# Patient Record
Sex: Female | Born: 1935
Health system: Southern US, Community
[De-identification: ages and names within clinical notes are randomized; demographics above are authoritative.]

## PROBLEM LIST (undated history)

## (undated) DIAGNOSIS — R001 Bradycardia, unspecified: Secondary | ICD-10-CM

## (undated) DIAGNOSIS — G43909 Migraine, unspecified, not intractable, without status migrainosus: Secondary | ICD-10-CM

## (undated) DIAGNOSIS — Z87440 Personal history of urinary (tract) infections: Secondary | ICD-10-CM

## (undated) DIAGNOSIS — M109 Gout, unspecified: Secondary | ICD-10-CM

## (undated) DIAGNOSIS — I10 Essential (primary) hypertension: Secondary | ICD-10-CM

## (undated) DIAGNOSIS — R42 Dizziness and giddiness: Secondary | ICD-10-CM

## (undated) DIAGNOSIS — E785 Hyperlipidemia, unspecified: Secondary | ICD-10-CM

## (undated) DIAGNOSIS — E079 Disorder of thyroid, unspecified: Secondary | ICD-10-CM

## (undated) HISTORY — DX: Migraine, unspecified, not intractable, without status migrainosus: G43.909

## (undated) HISTORY — DX: Personal history of urinary (tract) infections: Z87.440

## (undated) HISTORY — DX: Gout, unspecified: M10.9

## (undated) HISTORY — PX: PARATHYROIDECTOMY: SHX19

## (undated) HISTORY — DX: Disorder of thyroid, unspecified: E07.9

## (undated) HISTORY — PX: CHOLECYSTECTOMY: SHX55

## (undated) HISTORY — PX: GALLBLADDER SURGERY: SHX652

## (undated) HISTORY — DX: Essential (primary) hypertension: I10

## (undated) HISTORY — PX: ABDOMINAL HYSTERECTOMY: SHX81

## (undated) HISTORY — DX: Hyperlipidemia, unspecified: E78.5

---

## 2003-11-16 ENCOUNTER — Ambulatory Visit (HOSPITAL_COMMUNITY): Admission: RE | Admit: 2003-11-16 | Discharge: 2003-11-16 | Payer: Self-pay | Admitting: Cardiology

## 2004-04-20 ENCOUNTER — Emergency Department (HOSPITAL_COMMUNITY): Admission: EM | Admit: 2004-04-20 | Discharge: 2004-04-20 | Payer: Self-pay | Admitting: Family Medicine

## 2004-12-27 ENCOUNTER — Ambulatory Visit (HOSPITAL_COMMUNITY): Admission: RE | Admit: 2004-12-27 | Discharge: 2004-12-27 | Payer: Self-pay | Admitting: Obstetrics and Gynecology

## 2005-01-03 ENCOUNTER — Emergency Department (HOSPITAL_COMMUNITY): Admission: EM | Admit: 2005-01-03 | Discharge: 2005-01-03 | Payer: Self-pay | Admitting: Emergency Medicine

## 2005-02-27 ENCOUNTER — Emergency Department (HOSPITAL_COMMUNITY): Admission: EM | Admit: 2005-02-27 | Discharge: 2005-02-27 | Payer: Self-pay | Admitting: Family Medicine

## 2005-03-19 ENCOUNTER — Encounter: Admission: RE | Admit: 2005-03-19 | Discharge: 2005-03-19 | Payer: Self-pay | Admitting: Internal Medicine

## 2006-03-10 ENCOUNTER — Ambulatory Visit (HOSPITAL_COMMUNITY): Admission: RE | Admit: 2006-03-10 | Discharge: 2006-03-10 | Payer: Self-pay | Admitting: Obstetrics and Gynecology

## 2006-03-17 ENCOUNTER — Encounter (HOSPITAL_COMMUNITY): Admission: RE | Admit: 2006-03-17 | Discharge: 2006-03-17 | Payer: Self-pay | Admitting: Cardiology

## 2006-05-13 ENCOUNTER — Emergency Department (HOSPITAL_COMMUNITY): Admission: EM | Admit: 2006-05-13 | Discharge: 2006-05-13 | Payer: Self-pay | Admitting: Family Medicine

## 2006-07-29 ENCOUNTER — Encounter: Admission: RE | Admit: 2006-07-29 | Discharge: 2006-07-29 | Payer: Self-pay | Admitting: Sports Medicine

## 2006-09-10 ENCOUNTER — Inpatient Hospital Stay (HOSPITAL_BASED_OUTPATIENT_CLINIC_OR_DEPARTMENT_OTHER): Admission: RE | Admit: 2006-09-10 | Discharge: 2006-09-10 | Payer: Self-pay | Admitting: Cardiology

## 2007-01-03 ENCOUNTER — Emergency Department (HOSPITAL_COMMUNITY): Admission: EM | Admit: 2007-01-03 | Discharge: 2007-01-03 | Payer: Self-pay | Admitting: Emergency Medicine

## 2007-01-28 ENCOUNTER — Encounter: Admission: RE | Admit: 2007-01-28 | Discharge: 2007-01-28 | Payer: Self-pay | Admitting: Family Medicine

## 2007-06-09 ENCOUNTER — Encounter: Admission: RE | Admit: 2007-06-09 | Discharge: 2007-06-09 | Payer: Self-pay | Admitting: Internal Medicine

## 2007-06-23 ENCOUNTER — Ambulatory Visit (HOSPITAL_COMMUNITY): Admission: RE | Admit: 2007-06-23 | Discharge: 2007-06-23 | Payer: Self-pay | Admitting: Obstetrics and Gynecology

## 2007-10-01 ENCOUNTER — Emergency Department (HOSPITAL_COMMUNITY): Admission: EM | Admit: 2007-10-01 | Discharge: 2007-10-01 | Payer: Self-pay | Admitting: Emergency Medicine

## 2008-02-18 ENCOUNTER — Encounter: Admission: RE | Admit: 2008-02-18 | Discharge: 2008-02-18 | Payer: Self-pay | Admitting: Internal Medicine

## 2008-05-06 ENCOUNTER — Encounter: Admission: RE | Admit: 2008-05-06 | Discharge: 2008-05-06 | Payer: Self-pay | Admitting: Family Medicine

## 2008-05-20 ENCOUNTER — Observation Stay (HOSPITAL_COMMUNITY): Admission: EM | Admit: 2008-05-20 | Discharge: 2008-05-22 | Payer: Self-pay | Admitting: Emergency Medicine

## 2008-07-22 ENCOUNTER — Ambulatory Visit (HOSPITAL_COMMUNITY): Admission: RE | Admit: 2008-07-22 | Discharge: 2008-07-22 | Payer: Self-pay | Admitting: Cardiology

## 2008-10-22 ENCOUNTER — Emergency Department (HOSPITAL_COMMUNITY): Admission: EM | Admit: 2008-10-22 | Discharge: 2008-10-22 | Payer: Self-pay | Admitting: Emergency Medicine

## 2008-11-21 ENCOUNTER — Encounter: Admission: RE | Admit: 2008-11-21 | Discharge: 2008-11-21 | Payer: Self-pay | Admitting: Obstetrics and Gynecology

## 2008-12-04 ENCOUNTER — Inpatient Hospital Stay (HOSPITAL_COMMUNITY): Admission: EM | Admit: 2008-12-04 | Discharge: 2008-12-07 | Payer: Self-pay | Admitting: Emergency Medicine

## 2008-12-04 ENCOUNTER — Ambulatory Visit: Payer: Self-pay | Admitting: Internal Medicine

## 2008-12-05 ENCOUNTER — Ambulatory Visit: Payer: Self-pay | Admitting: Vascular Surgery

## 2008-12-05 ENCOUNTER — Encounter (INDEPENDENT_AMBULATORY_CARE_PROVIDER_SITE_OTHER): Payer: Self-pay | Admitting: Internal Medicine

## 2008-12-26 ENCOUNTER — Encounter: Payer: Self-pay | Admitting: Internal Medicine

## 2009-01-25 DIAGNOSIS — I10 Essential (primary) hypertension: Secondary | ICD-10-CM | POA: Insufficient documentation

## 2009-01-25 DIAGNOSIS — E7800 Pure hypercholesterolemia, unspecified: Secondary | ICD-10-CM

## 2009-01-26 ENCOUNTER — Emergency Department (HOSPITAL_COMMUNITY): Admission: EM | Admit: 2009-01-26 | Discharge: 2009-01-26 | Payer: Self-pay | Admitting: Emergency Medicine

## 2009-02-02 ENCOUNTER — Ambulatory Visit: Payer: Self-pay | Admitting: Internal Medicine

## 2009-02-02 DIAGNOSIS — R42 Dizziness and giddiness: Secondary | ICD-10-CM

## 2009-11-13 ENCOUNTER — Encounter: Admission: RE | Admit: 2009-11-13 | Discharge: 2009-11-13 | Payer: Self-pay | Admitting: Obstetrics and Gynecology

## 2010-02-02 ENCOUNTER — Emergency Department (HOSPITAL_COMMUNITY)
Admission: EM | Admit: 2010-02-02 | Discharge: 2010-02-02 | Payer: Self-pay | Source: Home / Self Care | Admitting: Emergency Medicine

## 2010-02-04 ENCOUNTER — Encounter: Payer: Self-pay | Admitting: Obstetrics and Gynecology

## 2010-02-13 NOTE — Consult Note (Signed)
Summary: [facility]  [facility]   Imported By: Marylou Mccoy 02/15/2009 16:18:25  _____________________________________________________________________  External Attachment:    Type:   Image     Comment:   External Document
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
## 2010-03-21 ENCOUNTER — Other Ambulatory Visit: Payer: Self-pay | Admitting: Internal Medicine

## 2010-03-21 DIAGNOSIS — M79604 Pain in right leg: Secondary | ICD-10-CM

## 2010-03-21 DIAGNOSIS — M545 Low back pain: Secondary | ICD-10-CM

## 2010-03-23 ENCOUNTER — Emergency Department (HOSPITAL_COMMUNITY)
Admission: EM | Admit: 2010-03-23 | Discharge: 2010-03-23 | Disposition: A | Discharge status: Home / Self Care | Payer: Medicare Other | Attending: Emergency Medicine | Admitting: Emergency Medicine

## 2010-03-23 ENCOUNTER — Inpatient Hospital Stay (INDEPENDENT_AMBULATORY_CARE_PROVIDER_SITE_OTHER)
Admission: RE | Admit: 2010-03-23 | Discharge: 2010-03-23 | Disposition: A | Discharge status: Home / Self Care | Payer: BC Managed Care – PPO | Source: Ambulatory Visit | Attending: Family Medicine | Admitting: Family Medicine

## 2010-03-23 DIAGNOSIS — E785 Hyperlipidemia, unspecified: Secondary | ICD-10-CM | POA: Insufficient documentation

## 2010-03-23 DIAGNOSIS — R82998 Other abnormal findings in urine: Secondary | ICD-10-CM | POA: Insufficient documentation

## 2010-03-23 DIAGNOSIS — R197 Diarrhea, unspecified: Secondary | ICD-10-CM

## 2010-03-23 DIAGNOSIS — E860 Dehydration: Secondary | ICD-10-CM

## 2010-03-23 DIAGNOSIS — R112 Nausea with vomiting, unspecified: Secondary | ICD-10-CM

## 2010-03-23 DIAGNOSIS — R1110 Vomiting, unspecified: Secondary | ICD-10-CM | POA: Insufficient documentation

## 2010-03-23 DIAGNOSIS — K5289 Other specified noninfective gastroenteritis and colitis: Secondary | ICD-10-CM | POA: Insufficient documentation

## 2010-03-23 DIAGNOSIS — Z7982 Long term (current) use of aspirin: Secondary | ICD-10-CM | POA: Insufficient documentation

## 2010-03-23 DIAGNOSIS — R1915 Other abnormal bowel sounds: Secondary | ICD-10-CM | POA: Insufficient documentation

## 2010-03-23 DIAGNOSIS — R109 Unspecified abdominal pain: Secondary | ICD-10-CM | POA: Insufficient documentation

## 2010-03-23 DIAGNOSIS — Z79899 Other long term (current) drug therapy: Secondary | ICD-10-CM | POA: Insufficient documentation

## 2010-03-23 DIAGNOSIS — I10 Essential (primary) hypertension: Secondary | ICD-10-CM | POA: Insufficient documentation

## 2010-03-23 LAB — POCT I-STAT, CHEM 8
BUN: 23 mg/dL (ref 6–23)
Creatinine, Ser: 1.2 mg/dL (ref 0.4–1.2)
Glucose, Bld: 133 mg/dL — ABNORMAL HIGH (ref 70–99)
Hemoglobin: 18.4 g/dL — ABNORMAL HIGH (ref 12.0–15.0)
TCO2: 25 mmol/L (ref 0–100)

## 2010-03-23 LAB — DIFFERENTIAL
Basophils Absolute: 0 10*3/uL (ref 0.0–0.1)
Eosinophils Absolute: 0 10*3/uL (ref 0.0–0.7)
Eosinophils Relative: 1 % (ref 0–5)
Lymphs Abs: 0.5 10*3/uL — ABNORMAL LOW (ref 0.7–4.0)
Neutrophils Relative %: 88 % — ABNORMAL HIGH (ref 43–77)

## 2010-03-23 LAB — CBC
MCV: 84.2 fL (ref 78.0–100.0)
Platelets: 188 10*3/uL (ref 150–400)
RBC: 5.31 MIL/uL — ABNORMAL HIGH (ref 3.87–5.11)
RDW: 14.8 % (ref 11.5–15.5)
WBC: 8.8 10*3/uL (ref 4.0–10.5)

## 2010-03-23 LAB — URINALYSIS, ROUTINE W REFLEX MICROSCOPIC
Glucose, UA: NEGATIVE mg/dL
Hgb urine dipstick: NEGATIVE
Protein, ur: NEGATIVE mg/dL
Specific Gravity, Urine: 1.023 (ref 1.005–1.030)
Urobilinogen, UA: 0.2 mg/dL (ref 0.0–1.0)

## 2010-03-23 LAB — URINE MICROSCOPIC-ADD ON

## 2010-03-23 LAB — COMPREHENSIVE METABOLIC PANEL
ALT: 44 U/L — ABNORMAL HIGH (ref 0–35)
AST: 31 U/L (ref 0–37)
Albumin: 4.1 g/dL (ref 3.5–5.2)
Alkaline Phosphatase: 90 U/L (ref 39–117)
Chloride: 103 mEq/L (ref 96–112)
GFR calc Af Amer: >60 mL/min (ref >60)
Potassium: 3.6 mEq/L (ref 3.5–5.1)
Sodium: 137 mEq/L (ref 135–145)
Total Bilirubin: 0.8 mg/dL (ref 0.3–1.2)
Total Protein: 7.6 g/dL (ref 6.0–8.3)

## 2010-03-25 LAB — URINE CULTURE

## 2010-03-26 ENCOUNTER — Other Ambulatory Visit: Payer: BC Managed Care – PPO

## 2010-04-13 ENCOUNTER — Other Ambulatory Visit: Payer: BC Managed Care – PPO

## 2010-04-16 ENCOUNTER — Ambulatory Visit
Admission: RE | Admit: 2010-04-16 | Discharge: 2010-04-16 | Disposition: A | Discharge status: Home / Self Care | Payer: Medicare Other | Source: Ambulatory Visit | Attending: Internal Medicine | Admitting: Internal Medicine

## 2010-04-16 DIAGNOSIS — M79604 Pain in right leg: Secondary | ICD-10-CM

## 2010-04-16 DIAGNOSIS — M545 Low back pain: Secondary | ICD-10-CM

## 2010-04-16 MED ORDER — GADOBENATE DIMEGLUMINE 529 MG/ML IV SOLN
18.0000 mL | Freq: Once | INTRAVENOUS | Status: AC | PRN
  Administered 2010-04-16: 18 mL via INTRAVENOUS

## 2010-04-18 LAB — CARDIAC PANEL(CRET KIN+CKTOT+MB+TROPI)
CK, MB: 2.4 ng/mL (ref 0.3–4.0)
Relative Index: 1.7 (ref 0.0–2.5)
Relative Index: 2.3 (ref 0.0–2.5)
Total CK: 103 U/L (ref 7–177)
Troponin I: 0.01 ng/mL (ref 0.00–0.06)
Troponin I: 0.02 ng/mL (ref 0.00–0.06)

## 2010-04-18 LAB — COMPREHENSIVE METABOLIC PANEL
ALT: 22 U/L (ref 0–35)
Albumin: 3.9 g/dL (ref 3.5–5.2)
Alkaline Phosphatase: 78 U/L (ref 39–117)
Chloride: 102 mEq/L (ref 96–112)
Glucose, Bld: 144 mg/dL — ABNORMAL HIGH (ref 70–99)
Potassium: 3.6 mEq/L (ref 3.5–5.1)
Sodium: 136 mEq/L (ref 135–145)
Total Bilirubin: 0.9 mg/dL (ref 0.3–1.2)
Total Protein: 7 g/dL (ref 6.0–8.3)

## 2010-04-18 LAB — POCT CARDIAC MARKERS
CKMB, poc: 1.4 ng/mL (ref 1.0–8.0)
CKMB, poc: <1 ng/mL — ABNORMAL LOW (ref 1.0–8.0)
Myoglobin, poc: 80.3 ng/mL (ref 12–200)
Myoglobin, poc: 89.4 ng/mL (ref 12–200)
Troponin i, poc: <0.05 ng/mL (ref 0.00–0.09)

## 2010-04-18 LAB — POCT I-STAT, CHEM 8
BUN: 8 mg/dL (ref 6–23)
Creatinine, Ser: 1.1 mg/dL (ref 0.4–1.2)
Hemoglobin: 15.3 g/dL — ABNORMAL HIGH (ref 12.0–15.0)
Potassium: 3.5 mEq/L (ref 3.5–5.1)
Sodium: 138 mEq/L (ref 135–145)
TCO2: 27 mmol/L (ref 0–100)

## 2010-04-18 LAB — URINALYSIS, ROUTINE W REFLEX MICROSCOPIC
Glucose, UA: NEGATIVE mg/dL
Hgb urine dipstick: NEGATIVE
Ketones, ur: NEGATIVE mg/dL
Protein, ur: NEGATIVE mg/dL
Urobilinogen, UA: 0.2 mg/dL (ref 0.0–1.0)

## 2010-04-18 LAB — URINE CULTURE: Colony Count: 4000

## 2010-04-18 LAB — CBC
HCT: 41.9 % (ref 36.0–46.0)
Hemoglobin: 13.9 g/dL (ref 12.0–15.0)
RDW: 14.7 % (ref 11.5–15.5)
WBC: 4.7 10*3/uL (ref 4.0–10.5)

## 2010-04-18 LAB — RAPID URINE DRUG SCREEN, HOSP PERFORMED
Barbiturates: NOT DETECTED
Opiates: NOT DETECTED

## 2010-04-18 LAB — DIFFERENTIAL
Basophils Absolute: 0 10*3/uL (ref 0.0–0.1)
Basophils Relative: 1 % (ref 0–1)
Eosinophils Absolute: 0.2 10*3/uL (ref 0.0–0.7)
Monocytes Absolute: 0.4 10*3/uL (ref 0.1–1.0)
Monocytes Relative: 9 % (ref 3–12)
Neutrophils Relative %: 43 % (ref 43–77)

## 2010-04-18 LAB — CK TOTAL AND CKMB (NOT AT ARMC)
CK, MB: 2.3 ng/mL (ref 0.3–4.0)
Relative Index: 1.7 (ref 0.0–2.5)
Total CK: 139 U/L (ref 7–177)

## 2010-04-24 LAB — HEMOGLOBIN A1C: Hgb A1c MFr Bld: 6.1 % (ref 4.6–6.1)

## 2010-04-24 LAB — DIFFERENTIAL
Eosinophils Absolute: 0 10*3/uL (ref 0.0–0.7)
Eosinophils Relative: 0 % (ref 0–5)
Lymphocytes Relative: 27 % (ref 12–46)
Monocytes Absolute: 0 10*3/uL — ABNORMAL LOW (ref 0.1–1.0)
Neutrophils Relative %: 72 % (ref 43–77)

## 2010-04-24 LAB — CBC
MCV: 87.9 fL (ref 78.0–100.0)
Platelets: DECREASED 10*3/uL (ref 150–400)
WBC: 3.4 10*3/uL — ABNORMAL LOW (ref 4.0–10.5)

## 2010-04-24 LAB — GLUCOSE, CAPILLARY
Glucose-Capillary: 157 mg/dL — ABNORMAL HIGH (ref 70–99)
Glucose-Capillary: 198 mg/dL — ABNORMAL HIGH (ref 70–99)
Glucose-Capillary: 203 mg/dL — ABNORMAL HIGH (ref 70–99)

## 2010-04-24 LAB — LIPID PANEL
LDL Cholesterol: 84 mg/dL (ref 0–99)
Triglycerides: 52 mg/dL (ref <150)

## 2010-04-24 LAB — APTT: aPTT: 29 seconds (ref 24–37)

## 2010-04-24 LAB — BASIC METABOLIC PANEL
BUN: 8 mg/dL (ref 6–23)
Calcium: 9.5 mg/dL (ref 8.4–10.5)
Chloride: 109 mEq/L (ref 96–112)
Creatinine, Ser: 0.97 mg/dL (ref 0.4–1.2)

## 2010-04-24 LAB — PROTIME-INR

## 2010-05-29 NOTE — Cardiovascular Report (Signed)
NAME:  Kathleen Pacheco, Kathleen Pacheco               ACCOUNT NO.:  [id number]   MEDICAL RECORD NO.:  [id number]          PATIENT TYPE:  OIB   LOCATION:  1961                         FACILITY:  [facility]   PHYSICIAN:  Mohan N. Sharyn Lull, M.D. DATE OF BIRTH:  1935/04/07   DATE OF PROCEDURE:  09/10/2006  DATE OF DISCHARGE:                            CARDIAC CATHETERIZATION   PROCEDURE:  Left cardiac cath with selective left and right coronary  angiography, LV graft via right groin using Judkins technique.   INDICATIONS FOR PROCEDURE:  Ms. Nida is 75 year old black female with  past medical history significant for hypertension, hypercholesteremia,  allergic rhinitis.  Complains of exertional dyspnea associated with  feeling weak and tired and fatigued and low energy.  Denies any chest  pain, nausea, vomiting, diaphoresis.  Denies PND, orthopnea, leg  swelling.  Denies palpitation, lightheadedness or syncope.  The patient  had Persantine Myoview in March 2008 which showed inferior ischemic  changes in the EKG with inferolateral ST depression during the  Persantine infusion, but Cardiolite scan was negative for ischemia and  was treated initially medically, but due to recurrent exertional dyspnea  which is associated with feeling weak, tired and positive Persantine  stress test by EKG criteria, discussed with the patient regarding left  cath and its risks and benefits and consented for the procedure.   PAST MEDICAL HISTORY:  As above.   PAST SURGICAL HISTORY:  1. She had hysterectomy many years ago.  2. Had cholecystectomy many years ago.  3. Had partial parathyroidectomy many years ago.  4. Had right ankle fraction recently.   ALLERGIES:  NO KNOWN DRUG ALLERGIES.   HOME MEDICATIONS:  1. Hyzaar 100/25 p.o. daily.  2. K-Dur 20 mEq daily.  3. Tiazac 240 mg p.o. daily.  4. Crestor 10 mg p.o. daily.  5. Enteric-coated aspirin 81 mg p.o. daily   SOCIAL HISTORY:  She is divorced, three children.  No  history of smoking  or drug abuse.  She drinks wine socially occasionally.  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  Her aunt died of MI at  age of 60.  Her mother died of multiple sclerosis at young age.  One  brother in good health   PHYSICAL EXAMINATION:  GENERAL:  She was alert and oriented x3.  No  acute distress.  VITAL SIGNS:  Blood pressure was 124/80, pulse was 72 regular.  HEENT:  Conjunctivae was pink.  NECK:  Supple.  No JVD.  LUNGS:  Clear to auscultation without rhonchi or rales.  HEART:  S1, S2 normal.  There was soft systolic murmur.  ABDOMEN:  Soft.  Bowel sounds were present, nontender.  EXTREMITIES:  There is no clubbing, cyanosis or edema.   IMPRESSION:  Exertional dyspnea, weakness, mildly positive Persantine  stress test by EKG criteria, hypertension, hypercholesteremia, mild  tricuspid regurgitation, family history of coronary artery disease,  history of allergic rhinitis.  Again, discussed with the patient  regarding repeat noninvasive stress testing versus left cath, its risks  and benefits  i.e. death, MI, stroke, local vascular complications.  __________ left cath.   PROCEDURE:  After obtaining informed consent, the patient was brought to  the cath lab and was placed on fluoroscopy table.  The right groin was  prepped and draped in usual fashion, and 2% Xylocaine was used for local  anesthesia in right groin.  With the help of thin-wall needle 4-French  arterial sheath was placed.  Sheath was aspirated and flushed.  A 4-  French left Judkins catheter was advanced over the wire under  fluoroscopic guidance up to the ascending aorta.  Wire was pulled out.  The catheter was aspirated and connected to the manifold.  Catheter was  further advanced and engaged into left coronary ostium.  Multiple views  of the left system were taken.  Next, the catheter was disengaged and  was pulled out  over the wire and was replaced with 4-French 3-D right 4-  [place] diagnostic catheter which was advanced over the wire under  fluoroscopic guidance of the ascending aorta.  Wire was pulled out.  The  catheter was aspirated and connected to the manifold.  Catheter was  further advanced and engaged into right coronary ostium.  Multiple views  of the right system were taken.  Next the catheter was disengaged and  was pulled out over the wire and was replaced with 4-French pigtail  catheter which was advanced over the wire under fluoroscopic guidance up  to the ascending aorta.  Catheter was further advanced across the aortic  valve into LV.  LV pressures were recorded.  Next LV graft was done in  30 degrees RAO position.  Post angiographic pressures were recorded from  LV and then pullback pressures were recorded from the aorta.  There was  no gradient across the aortic valve.  Next pigtail catheter was pulled  out over the wire.  Sheaths aspirated and flushed.   FINDINGS:  LV showed good LV systolic function, LVH EF of 55-60%, left  main was patent.  LAD has 10-15% mid stenosis.  Diagonal 1 to 3 were  very small.  The left circumflex was patent.  RCA has 20-30% mid  stenosis.  PDA and PLV branches were small which were patent.  The  patient tolerated procedure well.  There are no complications.  The  patient was transferred to recovery room in stable condition.      Eduardo Osier. Sharyn Lull, M.D.  Electronically Signed     MNH/MEDQ  D:  09/10/2006  T:  09/10/2006  Job:  [id number]   cc:   Catheterization Laboratory

## 2010-05-29 NOTE — Discharge Summary (Signed)
NAME:  Kathleen Pacheco, Kathleen Pacheco               ACCOUNT NO.:  [id number]   MEDICAL RECORD NO.:  [id number]          PATIENT TYPE:  INP   LOCATION:  3735                         FACILITY:  [facility]   PHYSICIAN:  Mohan N. Sharyn Lull, M.D. DATE OF BIRTH:  02/07/1935   DATE OF ADMISSION:  05/20/2008  DATE OF DISCHARGE:  05/22/2008                               DISCHARGE SUMMARY   ADMITTING DIAGNOSES:  1. Angioedema of face and lips secondary to angiotensin-converting      enzyme inhibitors.  2. Hypertension.  3. Hypercholesteremia.  4. Glucose intolerance.   FINAL DIAGNOSES:  1. Resolving angioedema of face and lips secondary to angiotensin-      converting enzyme inhibitors.  2. Hypertension.  3. Hypercholesteremia.  4. Glucose intolerance.   DISCHARGE MEDICATIONS:  1. Enteric-coated aspirin 81 mg 1 tablet daily.  2. Tiazac 180 mg 1 capsule daily.  3. Hydrochlorothiazide 25 mg 1 tablet daily.  4. Prednisone 10 mg 3 tablets daily today and then 2 tablets daily for      2 days and then 1 tablet daily for 4 more days.  5. K-Dur 20 mEq 1 tablet daily.  6. Crestor 10 mg p.o. daily.  7. Calcium 1 tablet daily.  8. Fosamax as before.  9. Vitamin D.  10.Vitamin B12.  11.Multivitamin 1 tablet daily as before.   The patient has been advised not to take any ACE inhibitors in future.   DIET:  Low salt, low cholesterol.  The patient has been advised to avoid  sweets.   ACTIVITY:  As tolerated.   Followup with me in 2 weeks.   Condition at discharge is stable.   BRIEF HISTORY AND HOSPITAL COURSE:  Ms. Screws is a 75 year old black  female with past medical history significant for hypertension, glucose  intolerance, hypercholesteremia.  She came to the ER complaining of  swelling of right side of the face and upper lip since yesterday morning  while washing dishes.  The patient was seen in my office yesterday and  was switched from Avalide to lisinopril HCT due to insurance reasons.  The patient  denies any chest pain, shortness of breath, denies tongue  swelling.  Denies palpitation, itching, or rash.  The patient received  initially IV Benadryl, Pepcid, dexamethasone, and prednisone in the ED  without much improvement in her facial and lip swelling.  In fact, she  noticed progressive leg swelling.  The patient denies any wheezing or  shortness of breath, denies any stridor.   PAST MEDICAL HISTORY:  As above.   PAST SURGICAL HISTORY:  She had a hysterectomy in 1985, had  cholecystectomy many years ago, had partial parathyroidectomy in the  past.   MEDICATIONS AT HOME:  1. She was on Avalide which was switched to Seroquel HCT.  2. Tiazac 240 mg p.o. daily.  3. Crestor 10 mg p.o. daily.  4. K-Dur 20 mEq p.o. daily.  5. Enteric-coated aspirin 81 mg p.o. daily.  6. Calcium.  7. Multivitamins.   SOCIAL HISTORY:  She is divorced, has 3 children.  No history of  smoking.  Drinks wine occasionally,  socially.  She is retired from  [employer].  She was a Runner, broadcasting/film/video.   FAMILY HISTORY:  Father died of MI at the age of 52.  Her aunt had MI at  the age of 28.  Mother died of complications of amyotrophic lateral  sclerosis.  One brother in good health.   PHYSICAL EXAMINATION:  GENERAL:  She was alert, awake, and oriented x3,  in no acute distress.  VITAL SIGNS:  Blood pressure was 149/93, pulse was 73 and regular, sats  were 100%.  HEENT:  Conjunctivae was pink.  Face, she had right facial swelling with  upper lip swelling more than the lower lip.  Tongue was okay.  Posterior  pharynx was okay.  NECK:  Supple.  No lymph nodes.  LUNGS:  Clear to auscultation without rhonchi or rales.  There was no  stridor.  CARDIOVASCULAR:  S1 and S2 was normal.  There was soft systolic murmur.  ABDOMEN:  Soft.  Bowel sounds were present, nontender.  EXTREMITIES:  There was no clubbing, cyanosis, or edema.   LABORATORY DATA:  Hemoglobin was 14.5, hematocrit 43.8, white count of   3.4.  Potassium was 4.6, glucose 172, BUN 8, creatinine 0.97.  Her  hemoglobin A1c was 6.1.  Cholesterol was 144, HDL 50, LDL 84, and  triglycerides 52.   BRIEF HOSPITAL COURSE:  The patient was admitted to telemetry unit, was  started on IV steroids which was gradually tapered off and was switched  to p.o. prednisone.  The patient's facial swelling has almost resolved.  Her upper lip and lower leg swelling has been markedly improved.  The  patient did not have any episodes of wheezing  or problem breathing during the hospital stay.  The patient will be  discharged home on above medications and will be followed up in my  office in 2 weeks.  The patient has been advised to call [phone] if she  notices any more further facial swelling or leg swelling.      Eduardo Osier. Sharyn Lull, M.D.  Electronically Signed     Eduardo Osier. Sharyn Lull, M.D.  Electronically Signed    MNH/MEDQ  D:  05/22/2008  T:  05/22/2008  Job:  [id number]

## 2010-06-07 ENCOUNTER — Other Ambulatory Visit (HOSPITAL_COMMUNITY): Payer: Self-pay | Admitting: Cardiology

## 2010-07-06 ENCOUNTER — Other Ambulatory Visit (HOSPITAL_COMMUNITY): Payer: Medicare Other

## 2010-07-23 ENCOUNTER — Ambulatory Visit (HOSPITAL_COMMUNITY): Payer: Medicare Other

## 2010-07-23 ENCOUNTER — Encounter (HOSPITAL_COMMUNITY)
Admission: RE | Admit: 2010-07-23 | Discharge: 2010-07-23 | Disposition: A | Discharge status: Home / Self Care | Payer: Medicare Other | Source: Ambulatory Visit | Attending: Cardiology | Admitting: Cardiology

## 2010-07-23 DIAGNOSIS — R079 Chest pain, unspecified: Secondary | ICD-10-CM | POA: Insufficient documentation

## 2010-07-23 MED ORDER — TECHNETIUM TC 99M TETROFOSMIN IV KIT
30.0000 | PACK | Freq: Once | INTRAVENOUS | Status: AC | PRN
  Administered 2010-07-23: 30 via INTRAVENOUS

## 2010-07-23 MED ORDER — TECHNETIUM TC 99M TETROFOSMIN IV KIT
10.0000 | PACK | Freq: Once | INTRAVENOUS | Status: AC | PRN
  Administered 2010-07-23: 10 via INTRAVENOUS

## 2010-09-05 IMAGING — US US EXTREM LOW VENOUS*R*
1 series · 14 of 24 positions shown · non-contrast
Comparison: None relevant

CLINICAL DATA: Right calf pain and swelling.  Question DVT.



[Series 1: us extrem low venous*right* · 14 of 26 slices shown]
[im 1/26]
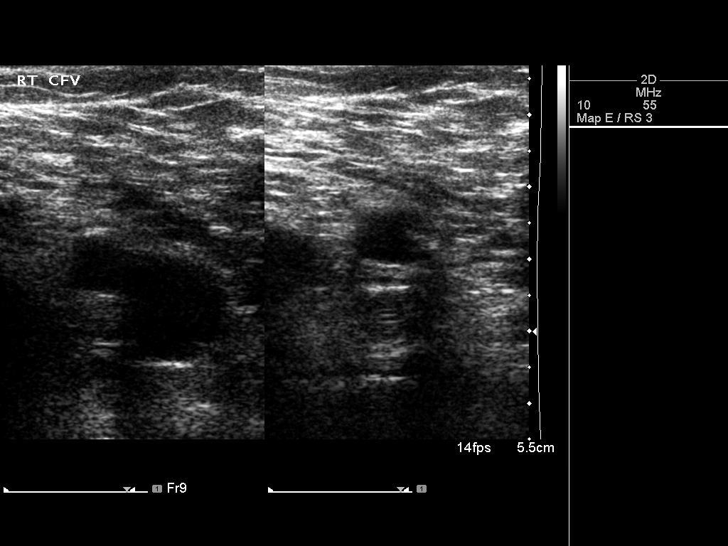
[im 3/26]
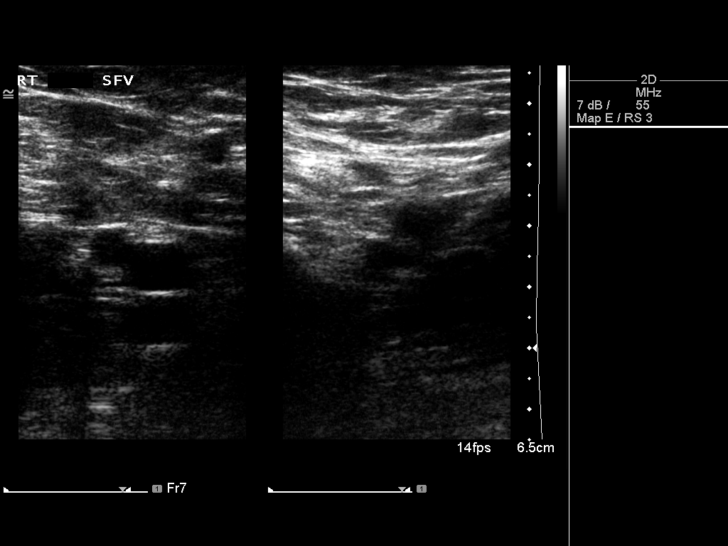
[im 5/26]
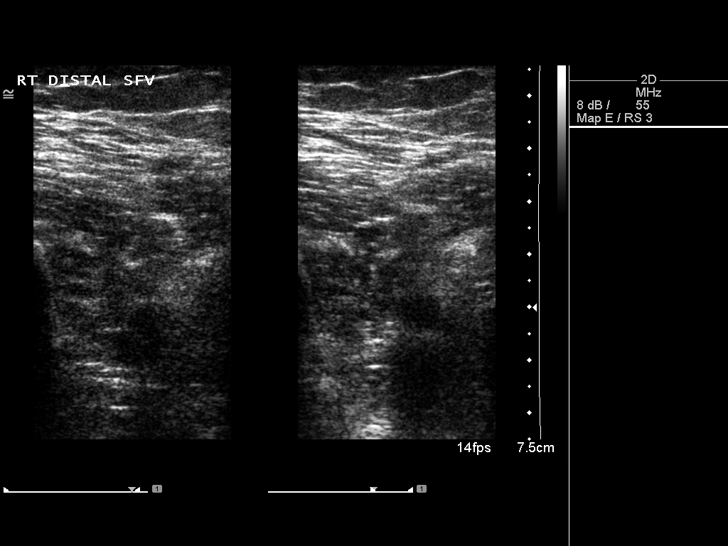
[im 7/26]
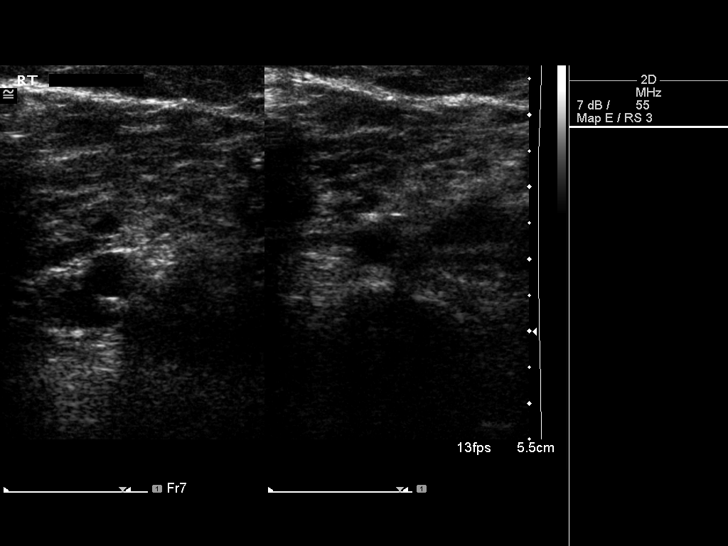
[im 8/26]
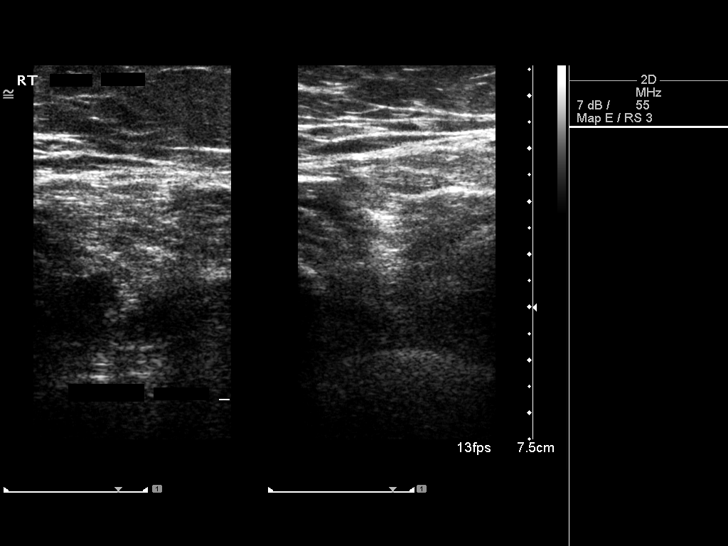
[im 10/26]
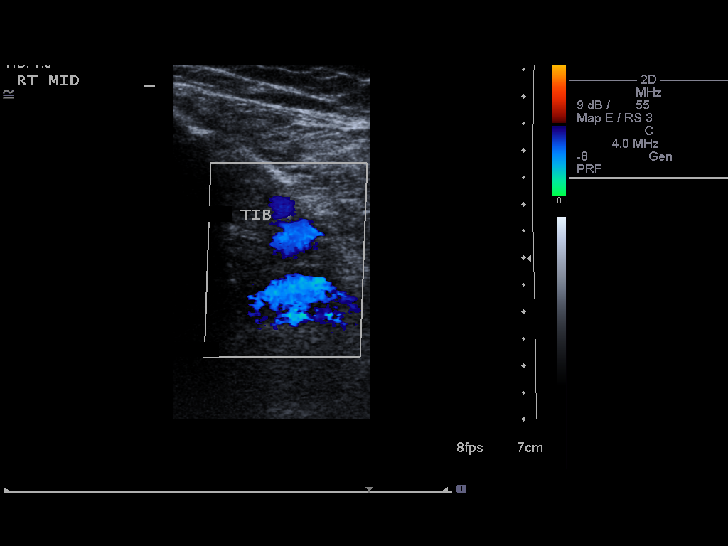
[im 12/26]
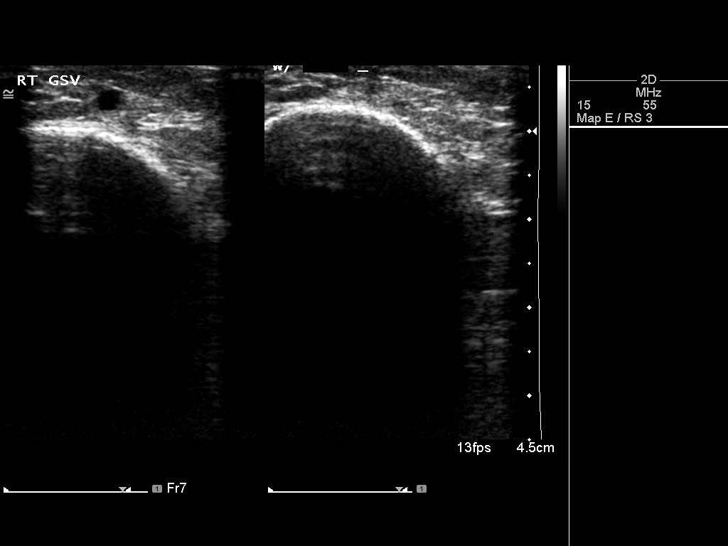
[im 14/26]
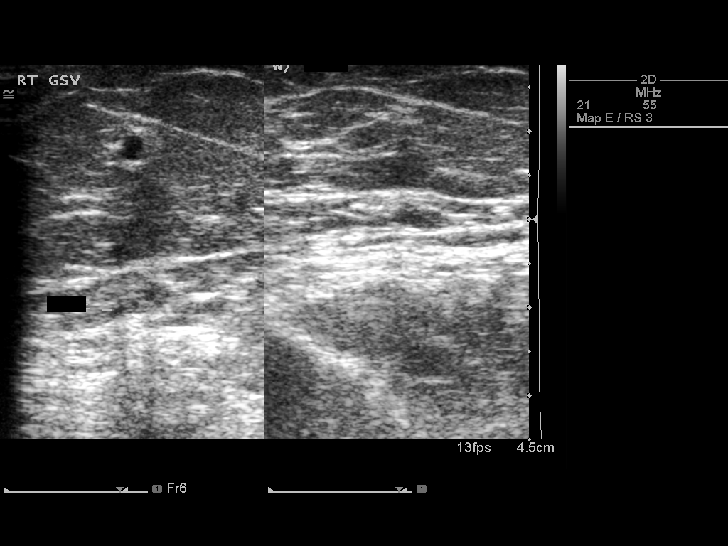
[im 16/26]
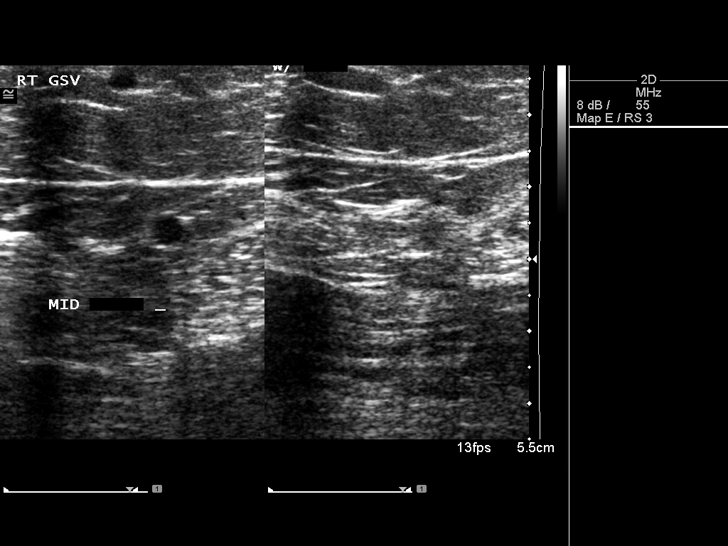
[im 18/26]
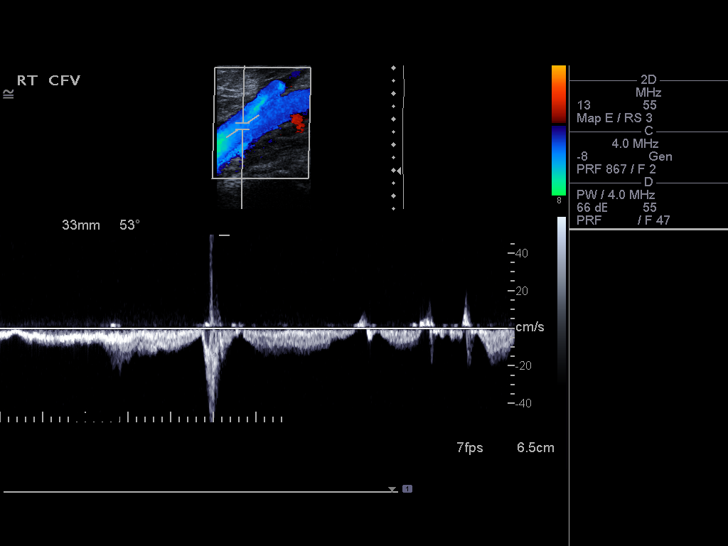
[im 20/26]
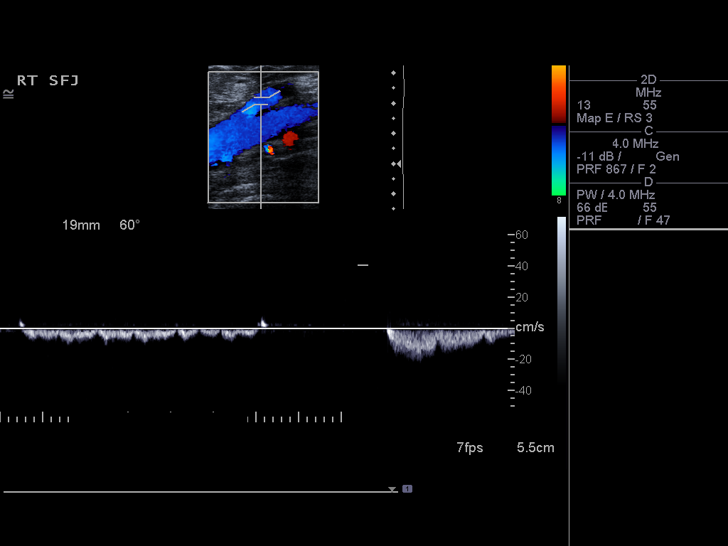
[im 21/26]
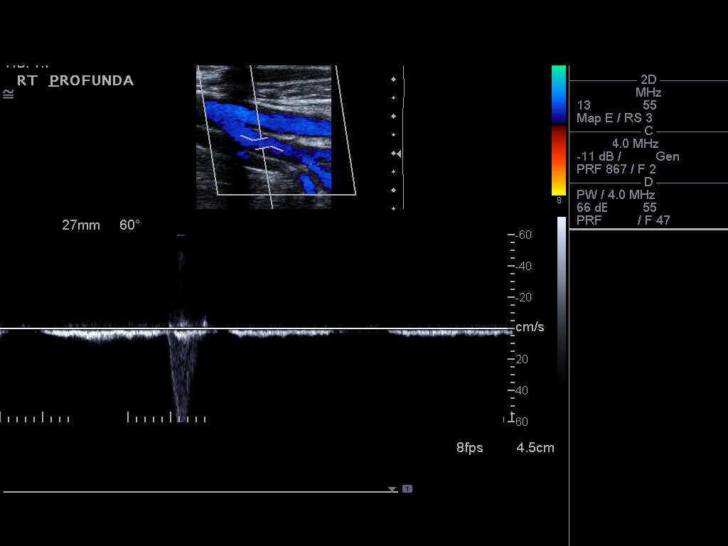
[im 23/26]
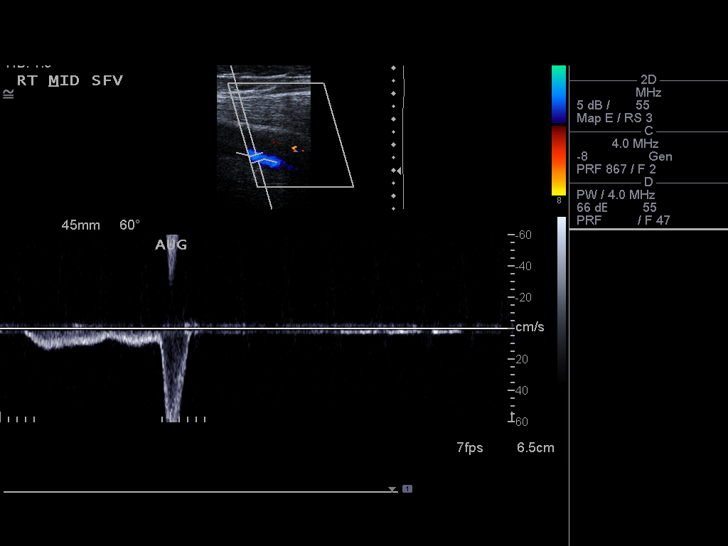
[im 26/26]
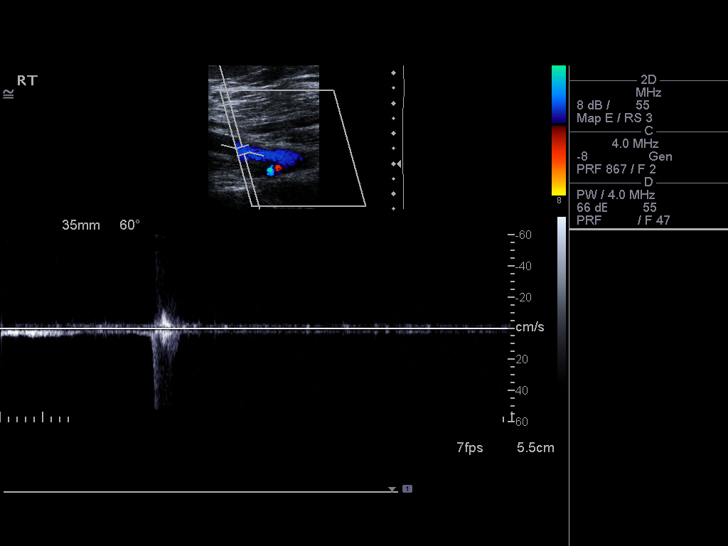

[14 of 24 positions shown; findings below may reference images not displayed]

FINDINGS: Normal compressibility of  the right common femoral,
superficial femoral, and popliteal veins is demonstrated, as well
as the visualized proximal calf veins.  No filling defects to
suggest DVT on grayscale or color Doppler imaging.  Doppler
waveforms show normal direction of venous flow, normal respiratory
phasicity and response to augmentation.
IMPRESSION: No evidence of  right lower extremity deep vein thrombosis.

## 2010-10-22 ENCOUNTER — Other Ambulatory Visit: Payer: Self-pay | Admitting: Obstetrics and Gynecology

## 2010-10-22 DIAGNOSIS — Z1231 Encounter for screening mammogram for malignant neoplasm of breast: Secondary | ICD-10-CM

## 2010-11-15 ENCOUNTER — Ambulatory Visit
Admission: RE | Admit: 2010-11-15 | Discharge: 2010-11-15 | Disposition: A | Discharge status: Home / Self Care | Payer: Medicare Other | Source: Ambulatory Visit | Attending: Obstetrics and Gynecology | Admitting: Obstetrics and Gynecology

## 2010-11-15 DIAGNOSIS — Z1231 Encounter for screening mammogram for malignant neoplasm of breast: Secondary | ICD-10-CM

## 2011-02-26 DIAGNOSIS — M999 Biomechanical lesion, unspecified: Secondary | ICD-10-CM | POA: Diagnosis not present

## 2011-02-26 DIAGNOSIS — M9981 Other biomechanical lesions of cervical region: Secondary | ICD-10-CM | POA: Diagnosis not present

## 2011-02-26 DIAGNOSIS — M5030 Other cervical disc degeneration, unspecified cervical region: Secondary | ICD-10-CM | POA: Diagnosis not present

## 2011-02-26 DIAGNOSIS — IMO0002 Reserved for concepts with insufficient information to code with codable children: Secondary | ICD-10-CM | POA: Diagnosis not present

## 2011-02-26 DIAGNOSIS — M5137 Other intervertebral disc degeneration, lumbosacral region: Secondary | ICD-10-CM | POA: Diagnosis not present

## 2011-03-04 ENCOUNTER — Other Ambulatory Visit: Payer: Self-pay | Admitting: Cardiology

## 2011-03-27 DIAGNOSIS — I10 Essential (primary) hypertension: Secondary | ICD-10-CM | POA: Diagnosis not present

## 2011-03-27 DIAGNOSIS — E7800 Pure hypercholesterolemia, unspecified: Secondary | ICD-10-CM | POA: Diagnosis not present

## 2011-03-27 DIAGNOSIS — R7309 Other abnormal glucose: Secondary | ICD-10-CM | POA: Diagnosis not present

## 2011-04-29 DIAGNOSIS — M25579 Pain in unspecified ankle and joints of unspecified foot: Secondary | ICD-10-CM | POA: Diagnosis not present

## 2011-04-29 DIAGNOSIS — M545 Low back pain: Secondary | ICD-10-CM | POA: Diagnosis not present

## 2011-05-01 DIAGNOSIS — M9981 Other biomechanical lesions of cervical region: Secondary | ICD-10-CM | POA: Diagnosis not present

## 2011-05-01 DIAGNOSIS — M5030 Other cervical disc degeneration, unspecified cervical region: Secondary | ICD-10-CM | POA: Diagnosis not present

## 2011-05-01 DIAGNOSIS — M999 Biomechanical lesion, unspecified: Secondary | ICD-10-CM | POA: Diagnosis not present

## 2011-05-01 DIAGNOSIS — M5137 Other intervertebral disc degeneration, lumbosacral region: Secondary | ICD-10-CM | POA: Diagnosis not present

## 2011-05-01 DIAGNOSIS — IMO0002 Reserved for concepts with insufficient information to code with codable children: Secondary | ICD-10-CM | POA: Diagnosis not present

## 2011-07-11 ENCOUNTER — Ambulatory Visit: Payer: BC Managed Care – PPO

## 2011-07-12 ENCOUNTER — Ambulatory Visit (INDEPENDENT_AMBULATORY_CARE_PROVIDER_SITE_OTHER): Payer: BC Managed Care – PPO | Admitting: Family Medicine

## 2011-07-12 ENCOUNTER — Ambulatory Visit: Payer: Medicare Other | Admitting: Emergency Medicine

## 2011-07-12 VITALS — BP 118/76 | HR 67 | Temp 98.0°F | Resp 18 | Ht 63.5 in | Wt 192.0 lb

## 2011-07-12 DIAGNOSIS — G479 Sleep disorder, unspecified: Secondary | ICD-10-CM

## 2011-07-12 DIAGNOSIS — R34 Anuria and oliguria: Secondary | ICD-10-CM

## 2011-07-12 DIAGNOSIS — H9209 Otalgia, unspecified ear: Secondary | ICD-10-CM

## 2011-07-12 DIAGNOSIS — R591 Generalized enlarged lymph nodes: Secondary | ICD-10-CM

## 2011-07-12 MED ORDER — CEFDINIR 300 MG PO CAPS: 300.0000 mg | ORAL_CAPSULE | Freq: Two times a day (BID) | ORAL | Status: AC

## 2011-07-12 MED ORDER — FLUTICASONE PROPIONATE 50 MCG/ACT NA SUSP: NASAL | Status: DC

## 2011-07-12 MED ORDER — ZOLPIDEM TARTRATE 5 MG PO TABS: ORAL_TABLET | ORAL | Status: DC

## 2011-07-12 NOTE — Patient Instructions (Addendum)
Take medications as ordered. Try to get enough rest. If you're not doing better return.  Just uses sleeping pills for a few nights, then save the left over for as needed.

## 2011-07-12 NOTE — Progress Notes (Signed)
Subjective: Patient has not been feeling well this week. She's not been able to rest and sleep. She has pain in her left ear and left side of her neck particularly. She is a congestion sore throat or cough. Denies any fever. She lives alone. Only medicines are for her osteoporosis and hypertension.  Objective: Pleasant alert lady in no acute distress at this time that she looks a little tired. Her right TM appears normal but is partially secured by wax her left ear canal looks clear, and the drum is intact slightly pinkish but not actively inflamed. Her throat was also little red but not bad looking. Next is very tender in the left submandibular area. There are a few nodes dear to the sternocleidomastoid muscle. Her chest is clear to auscultation. Heart regular without murmurs.  Assessment: Sleep disturbance Notalgia Lymphadenitis  Plan: Omnicef 1 twice a day Ambien 5 at bedtime when necessary sleep for a few nights.  I do not believe that a lot of laboratory tests your x-ray we'll change treatment today. She's not doing better she is to return.

## 2011-07-24 DIAGNOSIS — R21 Rash and other nonspecific skin eruption: Secondary | ICD-10-CM | POA: Diagnosis not present

## 2011-07-24 DIAGNOSIS — H60509 Unspecified acute noninfective otitis externa, unspecified ear: Secondary | ICD-10-CM | POA: Diagnosis not present

## 2011-07-24 DIAGNOSIS — J309 Allergic rhinitis, unspecified: Secondary | ICD-10-CM | POA: Diagnosis not present

## 2011-07-24 DIAGNOSIS — Z79899 Other long term (current) drug therapy: Secondary | ICD-10-CM | POA: Diagnosis not present

## 2011-07-29 DIAGNOSIS — R7309 Other abnormal glucose: Secondary | ICD-10-CM | POA: Diagnosis not present

## 2011-07-29 DIAGNOSIS — E7800 Pure hypercholesterolemia, unspecified: Secondary | ICD-10-CM | POA: Diagnosis not present

## 2011-07-29 DIAGNOSIS — I10 Essential (primary) hypertension: Secondary | ICD-10-CM | POA: Diagnosis not present

## 2011-08-02 ENCOUNTER — Encounter: Payer: Self-pay | Admitting: Obstetrics and Gynecology

## 2011-08-07 ENCOUNTER — Ambulatory Visit: Payer: Self-pay | Admitting: Obstetrics and Gynecology

## 2011-08-08 DIAGNOSIS — L219 Seborrheic dermatitis, unspecified: Secondary | ICD-10-CM | POA: Diagnosis not present

## 2011-08-09 DIAGNOSIS — M5030 Other cervical disc degeneration, unspecified cervical region: Secondary | ICD-10-CM | POA: Diagnosis not present

## 2011-08-09 DIAGNOSIS — IMO0002 Reserved for concepts with insufficient information to code with codable children: Secondary | ICD-10-CM | POA: Diagnosis not present

## 2011-08-09 DIAGNOSIS — M9981 Other biomechanical lesions of cervical region: Secondary | ICD-10-CM | POA: Diagnosis not present

## 2011-08-09 DIAGNOSIS — M999 Biomechanical lesion, unspecified: Secondary | ICD-10-CM | POA: Diagnosis not present

## 2011-08-12 ENCOUNTER — Other Ambulatory Visit: Payer: Self-pay | Admitting: Cardiology

## 2011-08-16 DIAGNOSIS — M9981 Other biomechanical lesions of cervical region: Secondary | ICD-10-CM | POA: Diagnosis not present

## 2011-08-16 DIAGNOSIS — M5030 Other cervical disc degeneration, unspecified cervical region: Secondary | ICD-10-CM | POA: Diagnosis not present

## 2011-08-16 DIAGNOSIS — M999 Biomechanical lesion, unspecified: Secondary | ICD-10-CM | POA: Diagnosis not present

## 2011-08-16 DIAGNOSIS — IMO0002 Reserved for concepts with insufficient information to code with codable children: Secondary | ICD-10-CM | POA: Diagnosis not present

## 2011-09-18 DIAGNOSIS — M999 Biomechanical lesion, unspecified: Secondary | ICD-10-CM | POA: Diagnosis not present

## 2011-09-18 DIAGNOSIS — M9981 Other biomechanical lesions of cervical region: Secondary | ICD-10-CM | POA: Diagnosis not present

## 2011-09-18 DIAGNOSIS — M5030 Other cervical disc degeneration, unspecified cervical region: Secondary | ICD-10-CM | POA: Diagnosis not present

## 2011-09-18 DIAGNOSIS — IMO0002 Reserved for concepts with insufficient information to code with codable children: Secondary | ICD-10-CM | POA: Diagnosis not present

## 2011-10-07 DIAGNOSIS — J019 Acute sinusitis, unspecified: Secondary | ICD-10-CM | POA: Diagnosis not present

## 2011-10-07 DIAGNOSIS — J3089 Other allergic rhinitis: Secondary | ICD-10-CM | POA: Diagnosis not present

## 2011-10-07 DIAGNOSIS — J301 Allergic rhinitis due to pollen: Secondary | ICD-10-CM | POA: Diagnosis not present

## 2011-10-11 ENCOUNTER — Other Ambulatory Visit: Payer: Self-pay | Admitting: Obstetrics and Gynecology

## 2011-10-11 DIAGNOSIS — Z1231 Encounter for screening mammogram for malignant neoplasm of breast: Secondary | ICD-10-CM

## 2011-10-28 DIAGNOSIS — I10 Essential (primary) hypertension: Secondary | ICD-10-CM | POA: Diagnosis not present

## 2011-10-28 DIAGNOSIS — H2640 Unspecified secondary cataract: Secondary | ICD-10-CM | POA: Diagnosis not present

## 2011-10-28 DIAGNOSIS — H538 Other visual disturbances: Secondary | ICD-10-CM | POA: Diagnosis not present

## 2011-11-07 DIAGNOSIS — R7309 Other abnormal glucose: Secondary | ICD-10-CM | POA: Diagnosis not present

## 2011-11-07 DIAGNOSIS — E7800 Pure hypercholesterolemia, unspecified: Secondary | ICD-10-CM | POA: Diagnosis not present

## 2011-11-07 DIAGNOSIS — I10 Essential (primary) hypertension: Secondary | ICD-10-CM | POA: Diagnosis not present

## 2011-11-07 DIAGNOSIS — E039 Hypothyroidism, unspecified: Secondary | ICD-10-CM | POA: Diagnosis not present

## 2011-11-11 DIAGNOSIS — H538 Other visual disturbances: Secondary | ICD-10-CM | POA: Diagnosis not present

## 2011-11-11 DIAGNOSIS — I10 Essential (primary) hypertension: Secondary | ICD-10-CM | POA: Diagnosis not present

## 2011-11-13 DIAGNOSIS — R55 Syncope and collapse: Secondary | ICD-10-CM | POA: Diagnosis not present

## 2011-11-13 DIAGNOSIS — I10 Essential (primary) hypertension: Secondary | ICD-10-CM | POA: Diagnosis not present

## 2011-11-13 DIAGNOSIS — E7800 Pure hypercholesterolemia, unspecified: Secondary | ICD-10-CM | POA: Diagnosis not present

## 2011-11-13 DIAGNOSIS — R7309 Other abnormal glucose: Secondary | ICD-10-CM | POA: Diagnosis not present

## 2011-11-25 ENCOUNTER — Ambulatory Visit
Admission: RE | Admit: 2011-11-25 | Discharge: 2011-11-25 | Disposition: A | Discharge status: Home / Self Care | Payer: BC Managed Care – PPO | Source: Ambulatory Visit | Attending: Obstetrics and Gynecology | Admitting: Obstetrics and Gynecology

## 2011-11-25 ENCOUNTER — Ambulatory Visit: Payer: Medicare Other

## 2011-11-25 DIAGNOSIS — Z1231 Encounter for screening mammogram for malignant neoplasm of breast: Secondary | ICD-10-CM

## 2011-11-28 DIAGNOSIS — M5030 Other cervical disc degeneration, unspecified cervical region: Secondary | ICD-10-CM | POA: Diagnosis not present

## 2011-11-28 DIAGNOSIS — M999 Biomechanical lesion, unspecified: Secondary | ICD-10-CM | POA: Diagnosis not present

## 2011-11-28 DIAGNOSIS — M9981 Other biomechanical lesions of cervical region: Secondary | ICD-10-CM | POA: Diagnosis not present

## 2011-11-28 DIAGNOSIS — IMO0002 Reserved for concepts with insufficient information to code with codable children: Secondary | ICD-10-CM | POA: Diagnosis not present

## 2011-12-05 DIAGNOSIS — M9981 Other biomechanical lesions of cervical region: Secondary | ICD-10-CM | POA: Diagnosis not present

## 2011-12-05 DIAGNOSIS — M5030 Other cervical disc degeneration, unspecified cervical region: Secondary | ICD-10-CM | POA: Diagnosis not present

## 2011-12-05 DIAGNOSIS — M999 Biomechanical lesion, unspecified: Secondary | ICD-10-CM | POA: Diagnosis not present

## 2011-12-05 DIAGNOSIS — IMO0002 Reserved for concepts with insufficient information to code with codable children: Secondary | ICD-10-CM | POA: Diagnosis not present

## 2011-12-26 ENCOUNTER — Ambulatory Visit: Payer: Self-pay | Admitting: Obstetrics and Gynecology

## 2011-12-30 ENCOUNTER — Ambulatory Visit: Payer: Self-pay | Admitting: Obstetrics and Gynecology

## 2012-01-01 DIAGNOSIS — M999 Biomechanical lesion, unspecified: Secondary | ICD-10-CM | POA: Diagnosis not present

## 2012-01-01 DIAGNOSIS — M5030 Other cervical disc degeneration, unspecified cervical region: Secondary | ICD-10-CM | POA: Diagnosis not present

## 2012-01-01 DIAGNOSIS — M9981 Other biomechanical lesions of cervical region: Secondary | ICD-10-CM | POA: Diagnosis not present

## 2012-01-01 DIAGNOSIS — IMO0002 Reserved for concepts with insufficient information to code with codable children: Secondary | ICD-10-CM | POA: Diagnosis not present

## 2012-01-10 ENCOUNTER — Ambulatory Visit: Payer: Self-pay | Admitting: Obstetrics and Gynecology

## 2012-01-14 DIAGNOSIS — M9981 Other biomechanical lesions of cervical region: Secondary | ICD-10-CM | POA: Diagnosis not present

## 2012-01-14 DIAGNOSIS — IMO0002 Reserved for concepts with insufficient information to code with codable children: Secondary | ICD-10-CM | POA: Diagnosis not present

## 2012-01-14 DIAGNOSIS — M5030 Other cervical disc degeneration, unspecified cervical region: Secondary | ICD-10-CM | POA: Diagnosis not present

## 2012-01-14 DIAGNOSIS — M999 Biomechanical lesion, unspecified: Secondary | ICD-10-CM | POA: Diagnosis not present

## 2012-01-22 ENCOUNTER — Ambulatory Visit: Payer: Self-pay | Admitting: Obstetrics and Gynecology

## 2012-01-22 DIAGNOSIS — D485 Neoplasm of uncertain behavior of skin: Secondary | ICD-10-CM | POA: Diagnosis not present

## 2012-02-06 DIAGNOSIS — L905 Scar conditions and fibrosis of skin: Secondary | ICD-10-CM | POA: Diagnosis not present

## 2012-02-07 ENCOUNTER — Encounter: Payer: Self-pay | Admitting: Obstetrics and Gynecology

## 2012-02-07 ENCOUNTER — Ambulatory Visit: Payer: Medicare Other | Admitting: Obstetrics and Gynecology

## 2012-02-07 VITALS — BP 120/64 | HR 70 | Resp 16 | Ht 63.0 in | Wt 202.0 lb

## 2012-02-07 DIAGNOSIS — Z01419 Encounter for gynecological examination (general) (routine) without abnormal findings: Secondary | ICD-10-CM | POA: Diagnosis not present

## 2012-02-07 DIAGNOSIS — E559 Vitamin D deficiency, unspecified: Secondary | ICD-10-CM

## 2012-02-07 DIAGNOSIS — Z13820 Encounter for screening for osteoporosis: Secondary | ICD-10-CM

## 2012-02-07 DIAGNOSIS — Z124 Encounter for screening for malignant neoplasm of cervix: Secondary | ICD-10-CM | POA: Diagnosis not present

## 2012-02-07 DIAGNOSIS — R350 Frequency of micturition: Secondary | ICD-10-CM

## 2012-02-07 DIAGNOSIS — M899 Disorder of bone, unspecified: Secondary | ICD-10-CM | POA: Diagnosis not present

## 2012-02-07 LAB — POCT URINALYSIS DIPSTICK
Bilirubin, UA: NEGATIVE
Ketones, UA: NEGATIVE
Protein, UA: NEGATIVE
Spec Grav, UA: 1.01
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
## 2012-02-08 LAB — URINE CULTURE: Colony Count: NO GROWTH

## 2012-02-08 LAB — VITAMIN D 25 HYDROXY (VIT D DEFICIENCY, FRACTURES): Vit D, 25-Hydroxy: 30 ng/mL (ref 30–89)

## 2012-02-11 LAB — PAP IG W/ RFLX HPV ASCU

## 2012-02-17 DIAGNOSIS — M9981 Other biomechanical lesions of cervical region: Secondary | ICD-10-CM | POA: Diagnosis not present

## 2012-02-17 DIAGNOSIS — M5030 Other cervical disc degeneration, unspecified cervical region: Secondary | ICD-10-CM | POA: Diagnosis not present

## 2012-02-17 DIAGNOSIS — IMO0002 Reserved for concepts with insufficient information to code with codable children: Secondary | ICD-10-CM | POA: Diagnosis not present

## 2012-02-17 DIAGNOSIS — M999 Biomechanical lesion, unspecified: Secondary | ICD-10-CM | POA: Diagnosis not present

## 2012-03-06 DIAGNOSIS — I10 Essential (primary) hypertension: Secondary | ICD-10-CM | POA: Diagnosis not present

## 2012-03-06 DIAGNOSIS — E7800 Pure hypercholesterolemia, unspecified: Secondary | ICD-10-CM | POA: Diagnosis not present

## 2012-03-06 DIAGNOSIS — R7309 Other abnormal glucose: Secondary | ICD-10-CM | POA: Diagnosis not present

## 2012-03-12 DIAGNOSIS — IMO0002 Reserved for concepts with insufficient information to code with codable children: Secondary | ICD-10-CM | POA: Diagnosis not present

## 2012-03-12 DIAGNOSIS — M9981 Other biomechanical lesions of cervical region: Secondary | ICD-10-CM | POA: Diagnosis not present

## 2012-03-12 DIAGNOSIS — M5030 Other cervical disc degeneration, unspecified cervical region: Secondary | ICD-10-CM | POA: Diagnosis not present

## 2012-03-12 DIAGNOSIS — M999 Biomechanical lesion, unspecified: Secondary | ICD-10-CM | POA: Diagnosis not present

## 2012-04-22 DIAGNOSIS — M818 Other osteoporosis without current pathological fracture: Secondary | ICD-10-CM | POA: Diagnosis not present

## 2012-04-22 DIAGNOSIS — M25579 Pain in unspecified ankle and joints of unspecified foot: Secondary | ICD-10-CM | POA: Diagnosis not present

## 2012-05-20 DIAGNOSIS — M5137 Other intervertebral disc degeneration, lumbosacral region: Secondary | ICD-10-CM | POA: Diagnosis not present

## 2012-05-20 DIAGNOSIS — IMO0002 Reserved for concepts with insufficient information to code with codable children: Secondary | ICD-10-CM | POA: Diagnosis not present

## 2012-05-20 DIAGNOSIS — M25579 Pain in unspecified ankle and joints of unspecified foot: Secondary | ICD-10-CM | POA: Diagnosis not present

## 2012-05-20 DIAGNOSIS — M5030 Other cervical disc degeneration, unspecified cervical region: Secondary | ICD-10-CM | POA: Diagnosis not present

## 2012-05-20 DIAGNOSIS — M9981 Other biomechanical lesions of cervical region: Secondary | ICD-10-CM | POA: Diagnosis not present

## 2012-05-20 DIAGNOSIS — M999 Biomechanical lesion, unspecified: Secondary | ICD-10-CM | POA: Diagnosis not present

## 2012-06-12 DIAGNOSIS — E7800 Pure hypercholesterolemia, unspecified: Secondary | ICD-10-CM | POA: Diagnosis not present

## 2012-06-12 DIAGNOSIS — R7309 Other abnormal glucose: Secondary | ICD-10-CM | POA: Diagnosis not present

## 2012-06-12 DIAGNOSIS — I10 Essential (primary) hypertension: Secondary | ICD-10-CM | POA: Diagnosis not present

## 2012-06-15 DIAGNOSIS — D485 Neoplasm of uncertain behavior of skin: Secondary | ICD-10-CM | POA: Diagnosis not present

## 2012-06-15 DIAGNOSIS — L821 Other seborrheic keratosis: Secondary | ICD-10-CM | POA: Diagnosis not present

## 2012-06-22 DIAGNOSIS — M25579 Pain in unspecified ankle and joints of unspecified foot: Secondary | ICD-10-CM | POA: Diagnosis not present

## 2012-06-22 DIAGNOSIS — S90129A Contusion of unspecified lesser toe(s) without damage to nail, initial encounter: Secondary | ICD-10-CM | POA: Diagnosis not present

## 2012-08-13 DIAGNOSIS — M19079 Primary osteoarthritis, unspecified ankle and foot: Secondary | ICD-10-CM | POA: Diagnosis not present

## 2012-09-17 DIAGNOSIS — IMO0002 Reserved for concepts with insufficient information to code with codable children: Secondary | ICD-10-CM | POA: Diagnosis not present

## 2012-09-17 DIAGNOSIS — M5137 Other intervertebral disc degeneration, lumbosacral region: Secondary | ICD-10-CM | POA: Diagnosis not present

## 2012-09-17 DIAGNOSIS — M5030 Other cervical disc degeneration, unspecified cervical region: Secondary | ICD-10-CM | POA: Diagnosis not present

## 2012-09-17 DIAGNOSIS — M999 Biomechanical lesion, unspecified: Secondary | ICD-10-CM | POA: Diagnosis not present

## 2012-09-17 DIAGNOSIS — M9981 Other biomechanical lesions of cervical region: Secondary | ICD-10-CM | POA: Diagnosis not present

## 2012-09-21 DIAGNOSIS — E7800 Pure hypercholesterolemia, unspecified: Secondary | ICD-10-CM | POA: Diagnosis not present

## 2012-09-21 DIAGNOSIS — K219 Gastro-esophageal reflux disease without esophagitis: Secondary | ICD-10-CM | POA: Diagnosis not present

## 2012-09-21 DIAGNOSIS — R7309 Other abnormal glucose: Secondary | ICD-10-CM | POA: Diagnosis not present

## 2012-09-21 DIAGNOSIS — R0789 Other chest pain: Secondary | ICD-10-CM | POA: Diagnosis not present

## 2012-09-21 DIAGNOSIS — I10 Essential (primary) hypertension: Secondary | ICD-10-CM | POA: Diagnosis not present

## 2012-09-28 DIAGNOSIS — M5137 Other intervertebral disc degeneration, lumbosacral region: Secondary | ICD-10-CM | POA: Diagnosis not present

## 2012-09-28 DIAGNOSIS — M9981 Other biomechanical lesions of cervical region: Secondary | ICD-10-CM | POA: Diagnosis not present

## 2012-09-28 DIAGNOSIS — M5030 Other cervical disc degeneration, unspecified cervical region: Secondary | ICD-10-CM | POA: Diagnosis not present

## 2012-09-28 DIAGNOSIS — IMO0002 Reserved for concepts with insufficient information to code with codable children: Secondary | ICD-10-CM | POA: Diagnosis not present

## 2012-09-28 DIAGNOSIS — M999 Biomechanical lesion, unspecified: Secondary | ICD-10-CM | POA: Diagnosis not present

## 2012-10-28 ENCOUNTER — Other Ambulatory Visit: Payer: Self-pay

## 2012-10-28 DIAGNOSIS — Z1231 Encounter for screening mammogram for malignant neoplasm of breast: Secondary | ICD-10-CM

## 2012-11-04 DIAGNOSIS — M25549 Pain in joints of unspecified hand: Secondary | ICD-10-CM | POA: Diagnosis not present

## 2012-11-04 DIAGNOSIS — M19049 Primary osteoarthritis, unspecified hand: Secondary | ICD-10-CM | POA: Diagnosis not present

## 2012-11-27 ENCOUNTER — Ambulatory Visit
Admission: RE | Admit: 2012-11-27 | Discharge: 2012-11-27 | Disposition: A | Discharge status: Home / Self Care | Payer: Medicare Other | Source: Ambulatory Visit

## 2012-11-27 DIAGNOSIS — Z1231 Encounter for screening mammogram for malignant neoplasm of breast: Secondary | ICD-10-CM

## 2012-11-27 DIAGNOSIS — Z23 Encounter for immunization: Secondary | ICD-10-CM | POA: Diagnosis not present

## 2012-11-30 ENCOUNTER — Other Ambulatory Visit: Payer: Self-pay | Admitting: Obstetrics and Gynecology

## 2012-11-30 DIAGNOSIS — R928 Other abnormal and inconclusive findings on diagnostic imaging of breast: Secondary | ICD-10-CM

## 2012-12-18 ENCOUNTER — Ambulatory Visit
Admission: RE | Admit: 2012-12-18 | Discharge: 2012-12-18 | Disposition: A | Discharge status: Home / Self Care | Payer: Medicare Other | Source: Ambulatory Visit | Attending: Obstetrics and Gynecology | Admitting: Obstetrics and Gynecology

## 2012-12-18 DIAGNOSIS — R7309 Other abnormal glucose: Secondary | ICD-10-CM | POA: Diagnosis not present

## 2012-12-18 DIAGNOSIS — I10 Essential (primary) hypertension: Secondary | ICD-10-CM | POA: Diagnosis not present

## 2012-12-18 DIAGNOSIS — E7800 Pure hypercholesterolemia, unspecified: Secondary | ICD-10-CM | POA: Diagnosis not present

## 2012-12-18 DIAGNOSIS — R928 Other abnormal and inconclusive findings on diagnostic imaging of breast: Secondary | ICD-10-CM

## 2012-12-30 DIAGNOSIS — D485 Neoplasm of uncertain behavior of skin: Secondary | ICD-10-CM | POA: Diagnosis not present

## 2013-01-04 DIAGNOSIS — M5030 Other cervical disc degeneration, unspecified cervical region: Secondary | ICD-10-CM | POA: Diagnosis not present

## 2013-01-04 DIAGNOSIS — M5137 Other intervertebral disc degeneration, lumbosacral region: Secondary | ICD-10-CM | POA: Diagnosis not present

## 2013-01-04 DIAGNOSIS — M999 Biomechanical lesion, unspecified: Secondary | ICD-10-CM | POA: Diagnosis not present

## 2013-01-04 DIAGNOSIS — IMO0002 Reserved for concepts with insufficient information to code with codable children: Secondary | ICD-10-CM | POA: Diagnosis not present

## 2013-01-04 DIAGNOSIS — M9981 Other biomechanical lesions of cervical region: Secondary | ICD-10-CM | POA: Diagnosis not present

## 2013-02-23 DIAGNOSIS — M5030 Other cervical disc degeneration, unspecified cervical region: Secondary | ICD-10-CM | POA: Diagnosis not present

## 2013-02-23 DIAGNOSIS — M5137 Other intervertebral disc degeneration, lumbosacral region: Secondary | ICD-10-CM | POA: Diagnosis not present

## 2013-02-23 DIAGNOSIS — IMO0002 Reserved for concepts with insufficient information to code with codable children: Secondary | ICD-10-CM | POA: Diagnosis not present

## 2013-02-23 DIAGNOSIS — M9981 Other biomechanical lesions of cervical region: Secondary | ICD-10-CM | POA: Diagnosis not present

## 2013-02-23 DIAGNOSIS — M999 Biomechanical lesion, unspecified: Secondary | ICD-10-CM | POA: Diagnosis not present

## 2013-03-18 DIAGNOSIS — Z124 Encounter for screening for malignant neoplasm of cervix: Secondary | ICD-10-CM | POA: Diagnosis not present

## 2013-03-18 DIAGNOSIS — R350 Frequency of micturition: Secondary | ICD-10-CM | POA: Diagnosis not present

## 2013-04-06 DIAGNOSIS — R7309 Other abnormal glucose: Secondary | ICD-10-CM | POA: Diagnosis not present

## 2013-04-06 DIAGNOSIS — E7800 Pure hypercholesterolemia, unspecified: Secondary | ICD-10-CM | POA: Diagnosis not present

## 2013-04-06 DIAGNOSIS — I10 Essential (primary) hypertension: Secondary | ICD-10-CM | POA: Diagnosis not present

## 2013-04-06 DIAGNOSIS — K219 Gastro-esophageal reflux disease without esophagitis: Secondary | ICD-10-CM | POA: Diagnosis not present

## 2013-04-29 DIAGNOSIS — D485 Neoplasm of uncertain behavior of skin: Secondary | ICD-10-CM | POA: Diagnosis not present

## 2013-04-29 DIAGNOSIS — L259 Unspecified contact dermatitis, unspecified cause: Secondary | ICD-10-CM | POA: Diagnosis not present

## 2013-05-20 DIAGNOSIS — M5030 Other cervical disc degeneration, unspecified cervical region: Secondary | ICD-10-CM | POA: Diagnosis not present

## 2013-05-20 DIAGNOSIS — IMO0002 Reserved for concepts with insufficient information to code with codable children: Secondary | ICD-10-CM | POA: Diagnosis not present

## 2013-05-20 DIAGNOSIS — M999 Biomechanical lesion, unspecified: Secondary | ICD-10-CM | POA: Diagnosis not present

## 2013-05-20 DIAGNOSIS — M5137 Other intervertebral disc degeneration, lumbosacral region: Secondary | ICD-10-CM | POA: Diagnosis not present

## 2013-05-20 DIAGNOSIS — M9981 Other biomechanical lesions of cervical region: Secondary | ICD-10-CM | POA: Diagnosis not present

## 2013-05-27 DIAGNOSIS — M5030 Other cervical disc degeneration, unspecified cervical region: Secondary | ICD-10-CM | POA: Diagnosis not present

## 2013-05-27 DIAGNOSIS — M999 Biomechanical lesion, unspecified: Secondary | ICD-10-CM | POA: Diagnosis not present

## 2013-05-27 DIAGNOSIS — IMO0002 Reserved for concepts with insufficient information to code with codable children: Secondary | ICD-10-CM | POA: Diagnosis not present

## 2013-05-27 DIAGNOSIS — M9981 Other biomechanical lesions of cervical region: Secondary | ICD-10-CM | POA: Diagnosis not present

## 2013-05-27 DIAGNOSIS — M5137 Other intervertebral disc degeneration, lumbosacral region: Secondary | ICD-10-CM | POA: Diagnosis not present

## 2013-06-17 DIAGNOSIS — M999 Biomechanical lesion, unspecified: Secondary | ICD-10-CM | POA: Diagnosis not present

## 2013-06-17 DIAGNOSIS — M9981 Other biomechanical lesions of cervical region: Secondary | ICD-10-CM | POA: Diagnosis not present

## 2013-06-17 DIAGNOSIS — IMO0002 Reserved for concepts with insufficient information to code with codable children: Secondary | ICD-10-CM | POA: Diagnosis not present

## 2013-06-17 DIAGNOSIS — M5030 Other cervical disc degeneration, unspecified cervical region: Secondary | ICD-10-CM | POA: Diagnosis not present

## 2013-06-17 DIAGNOSIS — M5137 Other intervertebral disc degeneration, lumbosacral region: Secondary | ICD-10-CM | POA: Diagnosis not present

## 2013-06-30 ENCOUNTER — Ambulatory Visit (INDEPENDENT_AMBULATORY_CARE_PROVIDER_SITE_OTHER): Payer: Medicare Other | Admitting: Family Medicine

## 2013-06-30 ENCOUNTER — Telehealth: Payer: Self-pay

## 2013-06-30 VITALS — BP 122/70 | HR 61 | Temp 98.6°F | Resp 18 | Ht 63.0 in | Wt 200.0 lb

## 2013-06-30 DIAGNOSIS — Z862 Personal history of diseases of the blood and blood-forming organs and certain disorders involving the immune mechanism: Secondary | ICD-10-CM | POA: Diagnosis not present

## 2013-06-30 DIAGNOSIS — Z8639 Personal history of other endocrine, nutritional and metabolic disease: Secondary | ICD-10-CM | POA: Diagnosis not present

## 2013-06-30 DIAGNOSIS — R42 Dizziness and giddiness: Secondary | ICD-10-CM

## 2013-06-30 DIAGNOSIS — R040 Epistaxis: Secondary | ICD-10-CM | POA: Diagnosis not present

## 2013-06-30 LAB — POCT CBC
GRANULOCYTE PERCENT: 41.3 % (ref 37–80)
HCT, POC: 44.1 % (ref 37.7–47.9)
Hemoglobin: 13.8 g/dL (ref 12.2–16.2)
Lymph, poc: 2.5 (ref 0.6–3.4)
MCH, POC: 27 pg (ref 27–31.2)
MCHC: 31.3 g/dL — AB (ref 31.8–35.4)
MCV: 86.3 fL (ref 80–97)
MID (CBC): 0.4 (ref 0–0.9)
MPV: 10.5 fL (ref 0–99.8)
PLATELET COUNT, POC: 260 10*3/uL (ref 142–424)
POC GRANULOCYTE: 2.1 (ref 2–6.9)
POC LYMPH %: 49.8 % (ref 10–50)
POC MID %: 8.9 %M (ref 0–12)
RBC: 5.11 M/uL (ref 4.04–5.48)
RDW, POC: 14.6 %
WBC: 5 10*3/uL (ref 4.6–10.2)

## 2013-06-30 LAB — COMPREHENSIVE METABOLIC PANEL
ALK PHOS: 92 U/L (ref 39–117)
ALT: 17 U/L (ref 0–35)
AST: 16 U/L (ref 0–37)
Albumin: 4.2 g/dL (ref 3.5–5.2)
BILIRUBIN TOTAL: 0.6 mg/dL (ref 0.2–1.2)
BUN: 10 mg/dL (ref 6–23)
CO2: 25 mEq/L (ref 19–32)
CREATININE: 0.97 mg/dL (ref 0.50–1.10)
Calcium: 9.6 mg/dL (ref 8.4–10.5)
Chloride: 102 mEq/L (ref 96–112)
Glucose, Bld: 99 mg/dL (ref 70–99)
Potassium: 4 mEq/L (ref 3.5–5.3)
Sodium: 135 mEq/L (ref 135–145)
Total Protein: 7.4 g/dL (ref 6.0–8.3)

## 2013-06-30 LAB — TSH: TSH: 2.819 u[IU]/mL (ref 0.350–4.500)

## 2013-06-30 LAB — GLUCOSE, POCT (MANUAL RESULT ENTRY): POC GLUCOSE: 91 mg/dL (ref 70–99)

## 2013-06-30 NOTE — Telephone Encounter (Signed)
PT STATES SHE WAS SEEN TODAY BY DR Lorelei Pont AND WILL BE GETTING A CALL REGARDING HER LABS, HER HOUSE PHONE ISN'T WORKING AND WOULD LIKE A CALL BACK ON HER CELL AT [phone]

## 2013-06-30 NOTE — Telephone Encounter (Signed)
Dr. Lorelei Pont- FYI for lab results phone call.

## 2013-06-30 NOTE — Progress Notes (Addendum)
[facility] [address], [place] [address] [phone]  Date:  06/30/2013   Name:  Kathleen Pacheco   DOB:  1935-07-24   MRN:  [id number]  PCP:  PROVIDER NOT IN SYSTEM    Chief Complaint: Epistaxis and Dizziness   History of Present Illness:  Kathleen Pacheco is a 78 y.o. very pleasant female patient who presents with the following:  History of HTN, hyperthyroidism.  She noted a dizzy spell today and Monday evening (today is [place]). Dizzy = feeling of spinning/ vertigo. Sx lasted for maybe 5- 10 minutes.  No nausea or vomiting with this.  She has had some vertigo in the past She noted a slight HA last night, resolved on it's own with massage of "pressure point."  She also had 2 nosebleeds in the last 5 days- these were unprovoked. She was able to resolve bleeding within 10 minutes or so.   She has not noted any other unusual bleeding or bruising.   She recently traveled to [place] to see her Dealer.   No chest pain or SOB.   No tinnitus, no loss/ change of hearing or vision No blood thinners.  She takes [place] on occasion.  She has not taken in months.  She does take zyrtec for allergies- NOT the D type  History of parathyroid removal- no actual thyroid issues that she knows of  Patient Active Problem List   Diagnosis Date Noted  . DIZZINESS 02/02/2009  . HYPERCHOLESTEROLEMIA 01/25/2009  . HYPERTENSION 01/25/2009    Past Medical History  Diagnosis Date  . Thyroid disease   . Migraine   . Hypertension   . Hx of bladder infections     Past Surgical History  Procedure Laterality Date  . Abdominal hysterectomy    . Parathyroidectomy    . Gallbladder surgery      History  Substance Use Topics  . Smoking status: Never Smoker   . Smokeless tobacco: Never Used  . Alcohol Use: No    No family history on file.  Allergies  Allergen Reactions  . Ace Inhibitors   . Other     Artificial sweeteners  . Penicillins    Causes yeast infection    Medication list has been reviewed and updated.  Current Outpatient Prescriptions on File Prior to Visit  Medication Sig Dispense Refill  . alendronate (FOSAMAX) 10 MG tablet Take 10 mg by mouth once a week. Take with a full glass of water on an empty stomach.      Marland Kitchen amLODipine (NORVASC) 10 MG tablet Take 10 mg by mouth daily.      . calcium carbonate (OS-CAL) 600 MG TABS Take 600 mg by mouth 2 (two) times daily with a meal.      . fluticasone (FLONASE) 50 MCG/ACT nasal spray Use 2 sprays each nostril twice daily for 3 days, then once daily  16 g  1  . hydrochlorothiazide (HYDRODIURIL) 25 MG tablet Take 25 mg by mouth daily.      Marland Kitchen zolpidem (AMBIEN) 5 MG tablet 1/2 to 1 tablet at bedtime if needed for sleep  15 tablet  0   No current facility-administered medications on file prior to visit.    Review of Systems:  As per HPI- otherwise negative.   Physical Examination: Filed Vitals:   06/30/13 0854  BP: 122/70  Pulse: 61  Temp: 98.6 F (37 C)  Resp: 18   Filed Vitals:   06/30/13 0854  Height: 5\' 3"  (1.6 m)  Weight: 200 lb (90.719 kg)   Body mass index is 35.44 kg/(m^2). Ideal Body Weight: Weight in (lb) to have BMI = 25: 140.8  GEN: WDWN, NAD, Non-toxic, A & O x 3, overweight, looks well   HEENT: Atraumatic, Normocephalic. Neck supple. No masses, No LAD.  Bilateral TM wnl, oropharynx normal.  PEERL,EOMI.   Ears and Nose: No external deformity. CV: RRR, No M/G/R. No JVD. No thrill. No extra heart sounds. PULM: CTA B, no wheezes, crackles, rhonchi. No retractions. No resp. distress. No accessory muscle use. ABD: S, NT, ND, +BS. No rebound. No HSM. EXTR: No c/c/e NEURO Normal gait. Complete neuro exam is normal.  Normal strength, sensation, and DTR all extremities.  Negative romberg  PSYCH: Normally interactive. Conversant. Not depressed or anxious appearing.  Calm demeanor.   EKG: sinus bradycardia at 50 BPM, mild T changes that appear consistent  with EKG from 2011  Results for orders placed in visit on 06/30/13  POCT CBC      Result Value Ref Range   WBC 5.0  4.6 - 10.2 K/uL   Lymph, poc 2.5  0.6 - 3.4   POC LYMPH PERCENT 49.8  10 - 50 %L   MID (cbc) 0.4  0 - 0.9   POC MID % 8.9  0 - 12 %M   POC Granulocyte 2.1  2 - 6.9   Granulocyte percent 41.3  37 - 80 %G   RBC 5.11  4.04 - 5.48 M/uL   Hemoglobin 13.8  12.2 - 16.2 g/dL   HCT, POC 44.1  37.7 - 47.9 %   MCV 86.3  80 - 97 fL   MCH, POC 27.0  27 - 31.2 pg   MCHC 31.3 (*) 31.8 - 35.4 g/dL   RDW, POC 14.6     Platelet Count, POC 260  142 - 424 K/uL   MPV 10.5  0 - 99.8 fL  GLUCOSE, POCT (MANUAL RESULT ENTRY)      Result Value Ref Range   POC Glucose 91  70 - 99 mg/dl    Assessment and Plan: Vertigo - Plan: POCT CBC, POCT glucose (manual entry), Comprehensive metabolic panel, EKG 53-GDJM  Epistaxis - Plan: POCT CBC  History of thyroid disease - Plan: TSH  Kathleen Pacheco is here today to follow-up episodic vertigo for a couple of days as well as epistaxis. Reassured that her labs and EKG are ok.  She will let me know if she has any worsening or changes in her sx.  She is bradycardic today- she will call her cardiologist and arrange follow-up  Signed Lamar Blinks, MD  Call pt on her cell at [phone]  Called on [facility]/20[facility]. Labs look fine. If any trouble arranging f/u with cardiology please let me know.

## 2013-06-30 NOTE — Patient Instructions (Signed)
Please give your heart doctor a call today and arrange an appointment in the next week or two.  You may be having a slow heart rate that causes you to feel dizzy. Your cardiologist may want you to have a "holter monitor" test where they watch your heart rate for a couple of days.   If you have any recurrent, persistent, or worsening symptoms please let me know.  I will be in touch with the rest of your labs.

## 2013-07-05 DIAGNOSIS — I495 Sick sinus syndrome: Secondary | ICD-10-CM | POA: Diagnosis not present

## 2013-07-05 DIAGNOSIS — E785 Hyperlipidemia, unspecified: Secondary | ICD-10-CM | POA: Diagnosis not present

## 2013-07-05 DIAGNOSIS — I498 Other specified cardiac arrhythmias: Secondary | ICD-10-CM | POA: Diagnosis not present

## 2013-07-05 DIAGNOSIS — R42 Dizziness and giddiness: Secondary | ICD-10-CM | POA: Diagnosis not present

## 2013-07-05 DIAGNOSIS — R002 Palpitations: Secondary | ICD-10-CM | POA: Diagnosis not present

## 2013-07-05 DIAGNOSIS — I10 Essential (primary) hypertension: Secondary | ICD-10-CM | POA: Diagnosis not present

## 2013-07-05 DIAGNOSIS — R7309 Other abnormal glucose: Secondary | ICD-10-CM | POA: Diagnosis not present

## 2013-07-05 DIAGNOSIS — I369 Nonrheumatic tricuspid valve disorder, unspecified: Secondary | ICD-10-CM | POA: Diagnosis not present

## 2013-07-07 ENCOUNTER — Emergency Department (HOSPITAL_COMMUNITY)
Admission: EM | Admit: 2013-07-07 | Discharge: 2013-07-07 | Disposition: A | Discharge status: Home / Self Care | Payer: Medicare Other | Attending: Emergency Medicine | Admitting: Emergency Medicine

## 2013-07-07 ENCOUNTER — Other Ambulatory Visit: Payer: Self-pay

## 2013-07-07 ENCOUNTER — Encounter (HOSPITAL_COMMUNITY): Payer: Self-pay | Admitting: Emergency Medicine

## 2013-07-07 DIAGNOSIS — Z862 Personal history of diseases of the blood and blood-forming organs and certain disorders involving the immune mechanism: Secondary | ICD-10-CM | POA: Diagnosis not present

## 2013-07-07 DIAGNOSIS — G43909 Migraine, unspecified, not intractable, without status migrainosus: Secondary | ICD-10-CM | POA: Insufficient documentation

## 2013-07-07 DIAGNOSIS — Z87440 Personal history of urinary (tract) infections: Secondary | ICD-10-CM | POA: Insufficient documentation

## 2013-07-07 DIAGNOSIS — Z791 Long term (current) use of non-steroidal anti-inflammatories (NSAID): Secondary | ICD-10-CM | POA: Insufficient documentation

## 2013-07-07 DIAGNOSIS — I10 Essential (primary) hypertension: Secondary | ICD-10-CM | POA: Diagnosis not present

## 2013-07-07 DIAGNOSIS — Z8639 Personal history of other endocrine, nutritional and metabolic disease: Secondary | ICD-10-CM | POA: Insufficient documentation

## 2013-07-07 DIAGNOSIS — I498 Other specified cardiac arrhythmias: Secondary | ICD-10-CM | POA: Insufficient documentation

## 2013-07-07 DIAGNOSIS — M62830 Muscle spasm of back: Secondary | ICD-10-CM

## 2013-07-07 DIAGNOSIS — Z79899 Other long term (current) drug therapy: Secondary | ICD-10-CM | POA: Insufficient documentation

## 2013-07-07 DIAGNOSIS — Z7982 Long term (current) use of aspirin: Secondary | ICD-10-CM | POA: Insufficient documentation

## 2013-07-07 DIAGNOSIS — Z880 Allergy status to penicillin: Secondary | ICD-10-CM | POA: Insufficient documentation

## 2013-07-07 DIAGNOSIS — M5380 Other specified dorsopathies, site unspecified: Secondary | ICD-10-CM | POA: Diagnosis not present

## 2013-07-07 HISTORY — DX: Dizziness and giddiness: R42

## 2013-07-07 HISTORY — DX: Bradycardia, unspecified: R00.1

## 2013-07-07 MED ORDER — DIAZEPAM 5 MG PO TABS
5.0000 mg | ORAL_TABLET | Freq: Once | ORAL | Status: AC
  Administered 2013-07-07: 5 mg via ORAL
  Filled 2013-07-07: qty 1

## 2013-07-07 MED ORDER — DIAZEPAM 5 MG PO TABS: 5.0000 mg | ORAL_TABLET | Freq: Three times a day (TID) | ORAL | Status: DC | PRN

## 2013-07-07 MED ORDER — HYDROCODONE-ACETAMINOPHEN 5-325 MG PO TABS: 1.0000 | ORAL_TABLET | Freq: Four times a day (QID) | ORAL | Status: DC | PRN

## 2013-07-07 MED ORDER — HYDROCODONE-ACETAMINOPHEN 5-325 MG PO TABS
2.0000 | ORAL_TABLET | Freq: Once | ORAL | Status: AC
  Administered 2013-07-07: 2 via ORAL
  Filled 2013-07-07: qty 2

## 2013-07-07 NOTE — ED Notes (Signed)
pts guest states that she is driving pt home.

## 2013-07-07 NOTE — ED Notes (Signed)
Pt states right sided neck pain with radiation into her right shoulder. Pt states it feels severe enough to be a muscle spasm. Pt has tried pain medication and massaging with nothing helping. Pt has a heart monitor from PCP for bradycardia. Pt denies cardiac history.

## 2013-07-07 NOTE — ED Provider Notes (Signed)
CSN: [id number]     Arrival date & time 07/07/13  0147 History   First MD Initiated Contact with Patient 07/07/13 0242     Chief Complaint  Patient presents with  . Neck Pain  . Arm Pain     (Consider location/radiation/quality/duration/timing/severity/associated sxs/prior Treatment) Patient is a 78 y.o. female presenting with shoulder pain. The history is provided by the patient.  Shoulder Pain This is a new problem. The current episode started 2 days ago. The problem occurs constantly. The problem has not changed since onset.Pertinent negatives include no chest pain, no abdominal pain, no headaches and no shortness of breath. Nothing aggravates the symptoms. Nothing relieves the symptoms. She has tried nothing for the symptoms.    Past Medical History  Diagnosis Date  . Thyroid disease   . Migraine   . Hypertension   . Hx of bladder infections   . Vertigo   . Bradycardia    Past Surgical History  Procedure Laterality Date  . Abdominal hysterectomy    . Parathyroidectomy    . Gallbladder surgery    . Cholecystectomy     History reviewed. No pertinent family history. History  Substance Use Topics  . Smoking status: Never Smoker   . Smokeless tobacco: Never Used  . Alcohol Use: No   OB History   Grav Para Term Preterm Abortions TAB SAB Ect Mult Living   4 3 3  1  1   3      Review of Systems  Constitutional: Negative for fever.  Respiratory: Negative for cough and shortness of breath.   Cardiovascular: Negative for chest pain.  Gastrointestinal: Negative for vomiting and abdominal pain.  Neurological: Negative for headaches.  All other systems reviewed and are negative.     Allergies  Ace inhibitors; Other; and Penicillins  Home Medications   Prior to Admission medications   Medication Sig Start Date End Date Taking? Authorizing Provider  amLODipine (NORVASC) 10 MG tablet Take 10 mg by mouth daily.   Yes Historical Provider, MD  aspirin EC 81 MG tablet  Take 81 mg by mouth daily.   Yes Historical Provider, MD  hydrochlorothiazide (HYDRODIURIL) 25 MG tablet Take 25 mg by mouth daily.   Yes Historical Provider, MD  KLOR-CON M20 20 MEQ tablet Take 20 mEq by mouth daily.  05/03/13  Yes Historical Provider, MD  meloxicam (MOBIC) 15 MG tablet Take 15 mg by mouth daily.  06/28/13  Yes Historical Provider, MD  rosuvastatin (CRESTOR) 10 MG tablet Take 10 mg by mouth daily.   Yes Historical Provider, MD   BP 136/80  Pulse 55  Temp(Src) 97.8 F (36.6 C) (Oral)  Resp 16  Ht 5\' 3"  (1.6 m)  Wt 200 lb (90.719 kg)  BMI 35.44 kg/m2  SpO2 100% Physical Exam  Nursing note and vitals reviewed. Constitutional: She is oriented to person, place, and time. She appears well-developed and well-nourished. No distress.  HENT:  Head: Normocephalic and atraumatic.  Mouth/Throat: Oropharynx is clear and moist.  Eyes: EOM are normal. Pupils are equal, round, and reactive to light.  Neck: Normal range of motion. Neck supple.  Cardiovascular: Normal rate and regular rhythm.  Exam reveals no friction rub.   No murmur heard. Pulmonary/Chest: Effort normal and breath sounds normal. No respiratory distress. She has no wheezes. She has no rales.  Abdominal: Soft. She exhibits no distension. There is no tenderness. There is no rebound.  Musculoskeletal: Normal range of motion. She exhibits no edema.  Cervical back: She exhibits spasm (R trapezius).  Neurological: She is alert and oriented to person, place, and time.  Skin: She is not diaphoretic.    ED Course  Procedures (including critical care time) Labs Review Labs Reviewed - No data to display  Imaging Review No results found.   EKG Interpretation None      MDM   Final diagnoses:  Muscle spasm of back    29F with R shoulder muscle pain/spasm. Present for 2 days. No heavy lifting, no fevers, no sore throat, CP/SOB. On exam, R trapezius pain on palpation, mild spasm. Patient with bradycardia, hx of  same. Given muscle relaxers and pain meds. R arm neurovascularly intact. Not c/w cervical radiculopathy. Stable for discharge.    Osvaldo Shipper, MD 07/07/13 0800

## 2013-07-07 NOTE — Discharge Instructions (Signed)

## 2013-07-10 DIAGNOSIS — I10 Essential (primary) hypertension: Secondary | ICD-10-CM | POA: Insufficient documentation

## 2013-07-10 DIAGNOSIS — Z79899 Other long term (current) drug therapy: Secondary | ICD-10-CM | POA: Diagnosis not present

## 2013-07-10 DIAGNOSIS — Z7982 Long term (current) use of aspirin: Secondary | ICD-10-CM | POA: Insufficient documentation

## 2013-07-10 DIAGNOSIS — Z880 Allergy status to penicillin: Secondary | ICD-10-CM | POA: Diagnosis not present

## 2013-07-10 DIAGNOSIS — Z791 Long term (current) use of non-steroidal anti-inflammatories (NSAID): Secondary | ICD-10-CM | POA: Diagnosis not present

## 2013-07-10 DIAGNOSIS — E079 Disorder of thyroid, unspecified: Secondary | ICD-10-CM | POA: Diagnosis not present

## 2013-07-10 DIAGNOSIS — Z87448 Personal history of other diseases of urinary system: Secondary | ICD-10-CM | POA: Insufficient documentation

## 2013-07-10 DIAGNOSIS — M436 Torticollis: Secondary | ICD-10-CM | POA: Diagnosis not present

## 2013-07-11 ENCOUNTER — Encounter (HOSPITAL_COMMUNITY): Payer: Self-pay | Admitting: Emergency Medicine

## 2013-07-11 ENCOUNTER — Emergency Department (HOSPITAL_COMMUNITY)
Admission: EM | Admit: 2013-07-11 | Discharge: 2013-07-11 | Disposition: A | Discharge status: Home / Self Care | Payer: Medicare Other | Attending: Emergency Medicine | Admitting: Emergency Medicine

## 2013-07-11 DIAGNOSIS — M436 Torticollis: Secondary | ICD-10-CM

## 2013-07-11 MED ORDER — OXYCODONE-ACETAMINOPHEN 5-325 MG PO TABS
1.0000 | ORAL_TABLET | Freq: Once | ORAL | Status: AC
  Administered 2013-07-11: 1 via ORAL
  Filled 2013-07-11: qty 1

## 2013-07-11 MED ORDER — DIAZEPAM 5 MG PO TABS
5.0000 mg | ORAL_TABLET | Freq: Once | ORAL | Status: AC
  Administered 2013-07-11: 5 mg via ORAL
  Filled 2013-07-11: qty 1

## 2013-07-11 MED ORDER — OXYCODONE-ACETAMINOPHEN 5-325 MG PO TABS: 1.0000 | ORAL_TABLET | Freq: Three times a day (TID) | ORAL | Status: DC | PRN

## 2013-07-11 MED ORDER — DIAZEPAM 5 MG PO TABS: 5.0000 mg | ORAL_TABLET | Freq: Two times a day (BID) | ORAL | Status: DC

## 2013-07-11 NOTE — ED Notes (Signed)
The pt has had a stiff neck since Wednesday.  She was seen here and given meds that has not helped.  She still has the stiff neck and it has gone to her rt shoulder

## 2013-07-11 NOTE — ED Provider Notes (Signed)
CSN: [id number]     Arrival date & time 07/10/13  2344 History   First MD Initiated Contact with Patient 07/11/13 0013     Chief Complaint  Patient presents with  . Torticollis     (Consider location/radiation/quality/duration/timing/severity/associated sxs/prior Treatment)  HPI Comments: Patient presents with complaint of right sided neck pain and stiffness for the past 5 days. Patient was seen 4 days ago in emergency department and prescribed Valium and Vicodin after being diagnosed with torticollis. No definitive injury at onset. Patient complains of right neck pain with radiation into the top of her right shoulder. She does not have any pain that extends down into the arm. She denies numbness, tingling, or weakness of the distal right arm and hand. Pain is made much worse with movement. Patient states that she is having difficulty looking towards the right due to pain. She denies any lower extremity symptoms. She denies chest pain or shortness of breath. She denies abdominal pain, nausea, vomiting. Patient is here in emergency department for uncontrolled pain tonight. She's been taking the Vicodin which has not really been helping. She's been taking Valium which helps somewhat. No other medications prior to arrival for pain. No history of MI.   The history is provided by the patient.    Past Medical History  Diagnosis Date  . Thyroid disease   . Migraine   . Hypertension   . Hx of bladder infections   . Vertigo   . Bradycardia    Past Surgical History  Procedure Laterality Date  . Abdominal hysterectomy    . Parathyroidectomy    . Gallbladder surgery    . Cholecystectomy     No family history on file. History  Substance Use Topics  . Smoking status: Never Smoker   . Smokeless tobacco: Never Used  . Alcohol Use: No   OB History   Grav Para Term Preterm Abortions TAB SAB Ect Mult Living   4 3 3  1  1   3      Review of Systems  Constitutional: Negative for fever and  unexpected weight change.  Gastrointestinal: Negative for constipation.       Negative for fecal incontinence.   Genitourinary: Negative for dysuria, hematuria, flank pain, vaginal bleeding, vaginal discharge and pelvic pain.       Negative for urinary incontinence or retention.  Musculoskeletal: Positive for arthralgias and neck pain. Negative for back pain.  Neurological: Negative for weakness and numbness.       Denies saddle paresthesias.    Allergies  Ace inhibitors; Other; and Penicillins  Home Medications   Prior to Admission medications   Medication Sig Start Date End Date Taking? Authorizing Provider  amLODipine (NORVASC) 10 MG tablet Take 10 mg by mouth daily.    Historical Provider, MD  aspirin EC 81 MG tablet Take 81 mg by mouth daily.    Historical Provider, MD  diazepam (VALIUM) 5 MG tablet Take 1 tablet (5 mg total) by mouth every 8 (eight) hours as needed for muscle spasms. 07/07/13   Osvaldo Shipper, MD  hydrochlorothiazide (HYDRODIURIL) 25 MG tablet Take 25 mg by mouth daily.    Historical Provider, MD  HYDROcodone-acetaminophen (NORCO/VICODIN) 5-325 MG per tablet Take 1 tablet by mouth every 6 (six) hours as needed for moderate pain. 07/07/13   Osvaldo Shipper, MD  KLOR-CON M20 20 MEQ tablet Take 20 mEq by mouth daily.  05/03/13   Historical Provider, MD  meloxicam (MOBIC) 15 MG tablet Take  15 mg by mouth daily.  06/28/13   Historical Provider, MD  rosuvastatin (CRESTOR) 10 MG tablet Take 10 mg by mouth daily.    Historical Provider, MD   BP 141/71  Pulse 53  Temp(Src) 97.5 F (36.4 C) (Oral)  Resp 18  SpO2 100%  Physical Exam  Nursing note and vitals reviewed. Constitutional: She appears well-developed and well-nourished.  HENT:  Head: Normocephalic and atraumatic.  Eyes: Conjunctivae are normal.  Neck: Normal range of motion. Neck supple.  Cardiovascular:  Pulses:      Radial pulses are 2+ on the right side, and 2+ on the left side.    Pulmonary/Chest: Effort normal.  Abdominal: Soft. There is no tenderness. There is no CVA tenderness.  Musculoskeletal:       Right shoulder: She exhibits decreased range of motion, tenderness (laterally, posteriorly) and spasm (palpable spasm). She exhibits no bony tenderness, no swelling and no deformity.       Left shoulder: Normal.       Right elbow: Normal.      Right wrist: Normal.       Cervical back: She exhibits decreased range of motion and tenderness. She exhibits no bony tenderness.       Thoracic back: Normal.       Back:       Right upper arm: Normal.  No step-off noted with palpation of spine.   Neurological: She is alert. She has normal strength and normal reflexes. No sensory deficit.  5/5 strength in entire lower extremities bilaterally. No sensation deficit.   Skin: Skin is warm and dry. No rash noted.  Psychiatric: She has a normal mood and affect.   ED Course  Procedures (including critical care time) Labs Review Labs Reviewed - No data to display  Imaging Review No results found.   EKG Interpretation None      Patient seen and examined. X-rays ordered. Medications ordered.   Vital signs reviewed and are as follows: Filed Vitals:   07/11/13 0011  BP: 141/71  Pulse: 53  Temp: 97.5 F (36.4 C)  Resp: 18   Patient decided that she would decline x-rays at this time. Heat pack and medications given.  1:57 AM On recheck, patient is drowsy. Her friend will drive her home. Patient has an orthopedist appointment during the day tomorrow. I encouraged patient to followup as planned.  We discussed appropriate use of Percocet and Valium. She is to use these medications under supervision due to fall risk. She is to use the lowest dose at the lowest frequency prescribed. Patient verbalizes understanding and agrees with plan.  MDM   Final diagnoses:  Torticollis   Patient presents again with right-sided neck pain. Patient has palpable spasm. Pain is worse  with movement and she has some decreased range of motion due to spasm. Signs and symptoms are consistent with torticollis. I do not suspect that the patient has cervical radiculopathy. X-rays initially ordered to rule out underlying bony problems, however patient eventually declined. Her upper extremities and lower extremities are neurovascularly intact. I do not suspect a central cord issue. Patient has a followup with orthopedic tomorrow and I have strongly suggested that she keep this appointment. Will continue course of Valium and narcotic pain medication for the current time. We have addressed appropriate precautions. Patient understands the risks of these medications.  No dangerous or life-threatening conditions suspected or identified by history, physical exam, and by work-up. No indications for hospitalization identified.     Vonna Kotyk  Geiple, PA-C 07/11/13 0200

## 2013-07-11 NOTE — Discharge Instructions (Signed)
Please read and follow all provided instructions.  Your diagnoses today include:  1. Torticollis     Tests performed today include:  Vital signs - see below for your results today  Medications prescribed:   Oxycodone - narcotic pain medication  DO NOT drive or perform any activities that require you to be awake and alert because this medicine can make you drowsy.   Use pain medication only under direct supervision at the lowest possible dose needed to control your pain.    Valium - muscle relaxer medication  DO NOT drive or perform any activities that require you to be awake and alert because this medicine can make you drowsy.  Use muscle relaxer medication only under direct supervision at the lowest possible dose needed to control your pain.   Take any prescribed medications only as directed.  Home care instructions:   Follow any educational materials contained in this packet  Please rest, use ice or heat on your back for the next several days  Do not lift, push, pull anything more than 10 pounds for the next week  Follow-up instructions: Please follow-up with your orthopedic doctor as planned on Monday.   Return instructions:  SEEK IMMEDIATE MEDICAL ATTENTION IF YOU HAVE:  New numbness, tingling, weakness, or problem with the use of your arms or legs  Severe back pain not relieved with medications  Loss control of your bowels or bladder  Increasing pain in any areas of the body (such as chest or abdominal pain)  Shortness of breath, dizziness, or fainting.   Worsening nausea (feeling sick to your stomach), vomiting, fever, or sweats  Any other emergent concerns regarding your health   Additional Information:  Your vital signs today were: BP 141/71   Pulse 53   Temp(Src) 97.5 F (36.4 C) (Oral)   Resp 18   SpO2 100% If your blood pressure (BP) was elevated above 135/85 this visit, please have this repeated by your doctor within one  month. --------------

## 2013-07-11 NOTE — ED Provider Notes (Signed)
Medical screening examination/treatment/procedure(s) were performed by non-physician practitioner and as supervising physician I was immediately available for consultation/collaboration.   EKG Interpretation None        Elyn Peers, MD 07/11/13 [phone]

## 2013-07-12 DIAGNOSIS — M25519 Pain in unspecified shoulder: Secondary | ICD-10-CM | POA: Diagnosis not present

## 2013-07-12 DIAGNOSIS — M542 Cervicalgia: Secondary | ICD-10-CM | POA: Diagnosis not present

## 2013-07-12 DIAGNOSIS — M5030 Other cervical disc degeneration, unspecified cervical region: Secondary | ICD-10-CM | POA: Diagnosis not present

## 2013-07-13 DIAGNOSIS — M542 Cervicalgia: Secondary | ICD-10-CM | POA: Diagnosis not present

## 2013-07-18 DIAGNOSIS — R42 Dizziness and giddiness: Secondary | ICD-10-CM | POA: Diagnosis not present

## 2013-07-18 DIAGNOSIS — I495 Sick sinus syndrome: Secondary | ICD-10-CM | POA: Diagnosis not present

## 2013-07-18 DIAGNOSIS — R002 Palpitations: Secondary | ICD-10-CM | POA: Diagnosis not present

## 2013-07-21 DIAGNOSIS — M542 Cervicalgia: Secondary | ICD-10-CM | POA: Diagnosis not present

## 2013-07-23 DIAGNOSIS — M542 Cervicalgia: Secondary | ICD-10-CM | POA: Diagnosis not present

## 2013-07-26 DIAGNOSIS — M25519 Pain in unspecified shoulder: Secondary | ICD-10-CM | POA: Diagnosis not present

## 2013-07-26 DIAGNOSIS — M542 Cervicalgia: Secondary | ICD-10-CM | POA: Diagnosis not present

## 2013-08-04 DIAGNOSIS — I369 Nonrheumatic tricuspid valve disorder, unspecified: Secondary | ICD-10-CM | POA: Diagnosis not present

## 2013-08-04 DIAGNOSIS — M159 Polyosteoarthritis, unspecified: Secondary | ICD-10-CM | POA: Diagnosis not present

## 2013-08-04 DIAGNOSIS — I10 Essential (primary) hypertension: Secondary | ICD-10-CM | POA: Diagnosis not present

## 2013-08-04 DIAGNOSIS — E785 Hyperlipidemia, unspecified: Secondary | ICD-10-CM | POA: Diagnosis not present

## 2013-08-04 DIAGNOSIS — R7309 Other abnormal glucose: Secondary | ICD-10-CM | POA: Diagnosis not present

## 2013-08-13 DIAGNOSIS — IMO0002 Reserved for concepts with insufficient information to code with codable children: Secondary | ICD-10-CM | POA: Diagnosis not present

## 2013-08-13 DIAGNOSIS — M5137 Other intervertebral disc degeneration, lumbosacral region: Secondary | ICD-10-CM | POA: Diagnosis not present

## 2013-08-13 DIAGNOSIS — M999 Biomechanical lesion, unspecified: Secondary | ICD-10-CM | POA: Diagnosis not present

## 2013-08-13 DIAGNOSIS — M9981 Other biomechanical lesions of cervical region: Secondary | ICD-10-CM | POA: Diagnosis not present

## 2013-08-13 DIAGNOSIS — M5030 Other cervical disc degeneration, unspecified cervical region: Secondary | ICD-10-CM | POA: Diagnosis not present

## 2013-08-16 DIAGNOSIS — M5137 Other intervertebral disc degeneration, lumbosacral region: Secondary | ICD-10-CM | POA: Diagnosis not present

## 2013-08-16 DIAGNOSIS — M5030 Other cervical disc degeneration, unspecified cervical region: Secondary | ICD-10-CM | POA: Diagnosis not present

## 2013-08-16 DIAGNOSIS — M9981 Other biomechanical lesions of cervical region: Secondary | ICD-10-CM | POA: Diagnosis not present

## 2013-08-16 DIAGNOSIS — IMO0002 Reserved for concepts with insufficient information to code with codable children: Secondary | ICD-10-CM | POA: Diagnosis not present

## 2013-08-16 DIAGNOSIS — M999 Biomechanical lesion, unspecified: Secondary | ICD-10-CM | POA: Diagnosis not present

## 2013-08-24 DIAGNOSIS — M5137 Other intervertebral disc degeneration, lumbosacral region: Secondary | ICD-10-CM | POA: Diagnosis not present

## 2013-08-24 DIAGNOSIS — M5030 Other cervical disc degeneration, unspecified cervical region: Secondary | ICD-10-CM | POA: Diagnosis not present

## 2013-08-24 DIAGNOSIS — IMO0002 Reserved for concepts with insufficient information to code with codable children: Secondary | ICD-10-CM | POA: Diagnosis not present

## 2013-08-24 DIAGNOSIS — M9981 Other biomechanical lesions of cervical region: Secondary | ICD-10-CM | POA: Diagnosis not present

## 2013-08-24 DIAGNOSIS — M999 Biomechanical lesion, unspecified: Secondary | ICD-10-CM | POA: Diagnosis not present

## 2013-10-05 DIAGNOSIS — M999 Biomechanical lesion, unspecified: Secondary | ICD-10-CM | POA: Diagnosis not present

## 2013-10-05 DIAGNOSIS — M9981 Other biomechanical lesions of cervical region: Secondary | ICD-10-CM | POA: Diagnosis not present

## 2013-10-05 DIAGNOSIS — M5030 Other cervical disc degeneration, unspecified cervical region: Secondary | ICD-10-CM | POA: Diagnosis not present

## 2013-10-05 DIAGNOSIS — M5137 Other intervertebral disc degeneration, lumbosacral region: Secondary | ICD-10-CM | POA: Diagnosis not present

## 2013-10-05 DIAGNOSIS — IMO0002 Reserved for concepts with insufficient information to code with codable children: Secondary | ICD-10-CM | POA: Diagnosis not present

## 2013-10-06 DIAGNOSIS — I369 Nonrheumatic tricuspid valve disorder, unspecified: Secondary | ICD-10-CM | POA: Diagnosis not present

## 2013-10-06 DIAGNOSIS — I10 Essential (primary) hypertension: Secondary | ICD-10-CM | POA: Diagnosis not present

## 2013-10-06 DIAGNOSIS — E785 Hyperlipidemia, unspecified: Secondary | ICD-10-CM | POA: Diagnosis not present

## 2013-10-06 DIAGNOSIS — R7309 Other abnormal glucose: Secondary | ICD-10-CM | POA: Diagnosis not present

## 2013-10-06 DIAGNOSIS — M159 Polyosteoarthritis, unspecified: Secondary | ICD-10-CM | POA: Diagnosis not present

## 2013-10-22 ENCOUNTER — Encounter: Payer: Self-pay | Admitting: Family

## 2013-10-22 ENCOUNTER — Ambulatory Visit (INDEPENDENT_AMBULATORY_CARE_PROVIDER_SITE_OTHER): Payer: Medicare Other | Admitting: Family

## 2013-10-22 VITALS — BP 118/64 | HR 65 | Ht 63.0 in | Wt 200.0 lb

## 2013-10-22 DIAGNOSIS — R300 Dysuria: Secondary | ICD-10-CM

## 2013-10-22 DIAGNOSIS — I10 Essential (primary) hypertension: Secondary | ICD-10-CM

## 2013-10-22 DIAGNOSIS — Z23 Encounter for immunization: Secondary | ICD-10-CM | POA: Diagnosis not present

## 2013-10-22 DIAGNOSIS — E7800 Pure hypercholesterolemia, unspecified: Secondary | ICD-10-CM

## 2013-10-22 DIAGNOSIS — M19042 Primary osteoarthritis, left hand: Secondary | ICD-10-CM | POA: Diagnosis not present

## 2013-10-22 DIAGNOSIS — M19041 Primary osteoarthritis, right hand: Secondary | ICD-10-CM | POA: Diagnosis not present

## 2013-10-22 DIAGNOSIS — E663 Overweight: Secondary | ICD-10-CM

## 2013-10-22 DIAGNOSIS — M19049 Primary osteoarthritis, unspecified hand: Secondary | ICD-10-CM | POA: Insufficient documentation

## 2013-10-22 LAB — POCT URINALYSIS DIPSTICK
Bilirubin, UA: NEGATIVE
Glucose, UA: NEGATIVE
NITRITE UA: NEGATIVE
SPEC GRAV UA: 1.02
UROBILINOGEN UA: 0.2
pH, UA: 6

## 2013-10-22 LAB — TSH: TSH: 1.39 u[IU]/mL (ref 0.35–4.50)

## 2013-10-22 LAB — CBC WITH DIFFERENTIAL/PLATELET
Basophils Absolute: 0 10*3/uL (ref 0.0–0.1)
Basophils Relative: 0.6 % (ref 0.0–3.0)
EOS ABS: 0.3 10*3/uL (ref 0.0–0.7)
Eosinophils Relative: 5.1 % — ABNORMAL HIGH (ref 0.0–5.0)
HCT: 43.3 % (ref 36.0–46.0)
HEMOGLOBIN: 13.9 g/dL (ref 12.0–15.0)
LYMPHS PCT: 38.7 % (ref 12.0–46.0)
Lymphs Abs: 2.3 10*3/uL (ref 0.7–4.0)
MCHC: 32.2 g/dL (ref 30.0–36.0)
MCV: 84.8 fl (ref 78.0–100.0)
Monocytes Absolute: 0.5 10*3/uL (ref 0.1–1.0)
Monocytes Relative: 8.1 % (ref 3.0–12.0)
NEUTROS ABS: 2.8 10*3/uL (ref 1.4–7.7)
Neutrophils Relative %: 47.5 % (ref 43.0–77.0)
Platelets: 208 10*3/uL (ref 150.0–400.0)
RBC: 5.11 Mil/uL (ref 3.87–5.11)
RDW: 15.7 % — ABNORMAL HIGH (ref 11.5–15.5)
WBC: 5.8 10*3/uL (ref 4.0–10.5)

## 2013-10-22 LAB — HEPATIC FUNCTION PANEL
ALK PHOS: 88 U/L (ref 39–117)
ALT: 22 U/L (ref 0–35)
AST: 26 U/L (ref 0–37)
Albumin: 3.6 g/dL (ref 3.5–5.2)
Bilirubin, Direct: 0 mg/dL (ref 0.0–0.3)
TOTAL PROTEIN: 8.3 g/dL (ref 6.0–8.3)
Total Bilirubin: 0.8 mg/dL (ref 0.2–1.2)

## 2013-10-22 LAB — LIPID PANEL
Cholesterol: 215 mg/dL — ABNORMAL HIGH (ref 0–200)
HDL: 43.6 mg/dL (ref >39.00)
LDL Cholesterol: 143 mg/dL — ABNORMAL HIGH (ref 0–99)
NONHDL: 171.4
TRIGLYCERIDES: 144 mg/dL (ref 0.0–149.0)
Total CHOL/HDL Ratio: 5
VLDL: 28.8 mg/dL (ref 0.0–40.0)

## 2013-10-22 LAB — BASIC METABOLIC PANEL
BUN: 10 mg/dL (ref 6–23)
CO2: 23 mEq/L (ref 19–32)
Calcium: 9.7 mg/dL (ref 8.4–10.5)
Chloride: 105 mEq/L (ref 96–112)
Creatinine, Ser: 0.9 mg/dL (ref 0.4–1.2)
GFR: 80.23 mL/min (ref >60.00)
Glucose, Bld: 89 mg/dL (ref 70–99)
Potassium: 3.9 mEq/L (ref 3.5–5.1)
Sodium: 137 mEq/L (ref 135–145)

## 2013-10-22 NOTE — Progress Notes (Signed)
Pre visit review using our clinic review tool, if applicable. No additional management support is needed unless otherwise documented below in the visit note. 

## 2013-10-22 NOTE — Patient Instructions (Signed)

## 2013-10-22 NOTE — Progress Notes (Signed)
Subjective:    Patient ID: Kathleen Pacheco, female    DOB: [birth year], 78 y.o.   MRN: [id number]  HPI  78 year old African American female, nonsmoker with a history of hypertension, hypercholesterolemia, thyroidectomy, and obesity is in today to reestablish. Would like to have her flu shot done at [facility]. Does not currently exercise. Has not had Prevnar 13.  She is from [place] but had spent the last 26 years in [place]. Has 3 daughter, 5 grandchildren and a husband who's retired Social research officer, government.   Review of Systems  Constitutional: Negative.   HENT: Negative.   Respiratory: Negative.   Cardiovascular: Negative.   Gastrointestinal: Negative.   Endocrine: Negative.   Genitourinary: Negative.   Musculoskeletal: Negative.   Skin: Negative.   Allergic/Immunologic: Negative.   Neurological: Negative.   Psychiatric/Behavioral: Negative.    Past Medical History  Diagnosis Date  . Thyroid disease   . Migraine   . Hypertension   . Hx of bladder infections   . Vertigo   . Bradycardia     History   Social History  . Marital Status: Divorced    Spouse Name: N/A    Number of Children: N/A  . Years of Education: N/A   Occupational History  . Not on file.   Social History Main Topics  . Smoking status: Never Smoker   . Smokeless tobacco: Never Used  . Alcohol Use: No  . Drug Use: No  . Sexual Activity: No     Comment: HYST    Other Topics Concern  . Not on file   Social History Narrative  . No narrative on file    Past Surgical History  Procedure Laterality Date  . Abdominal hysterectomy    . Parathyroidectomy    . Gallbladder surgery    . Cholecystectomy      History reviewed. No pertinent family history.  Allergies  Allergen Reactions  . Ace Inhibitors   . Other     Artificial sweeteners  . Penicillins     Causes yeast infection    Current Outpatient Prescriptions on File Prior to Visit  Medication Sig Dispense Refill  . amLODipine (NORVASC) 10  MG tablet Take 10 mg by mouth daily.      Marland Kitchen aspirin EC 81 MG tablet Take 81 mg by mouth daily.      . hydrochlorothiazide (HYDRODIURIL) 25 MG tablet Take 25 mg by mouth daily.      Marland Kitchen KLOR-CON M20 20 MEQ tablet Take 20 mEq by mouth daily.       . meloxicam (MOBIC) 15 MG tablet Take 15 mg by mouth daily.       . rosuvastatin (CRESTOR) 10 MG tablet Take 10 mg by mouth daily.       No current facility-administered medications on file prior to visit.    BP 118/64  Pulse 65  Ht 5\' 3"  (1.6 m)  Wt 200 lb (90.719 kg)  BMI 35.44 kg/m2  SpO2 98%chart    Objective:   Physical Exam  Constitutional: She is oriented to person, place, and time. She appears well-developed and well-nourished.  HENT:  Right Ear: External ear normal.  Left Ear: External ear normal.  Nose: Nose normal.  Mouth/Throat: Oropharynx is clear and moist.  Neck: Normal range of motion. Neck supple. No thyromegaly present.  Cardiovascular: Normal rate, regular rhythm and normal heart sounds.   Pulmonary/Chest: Effort normal and breath sounds normal.  Abdominal: Soft. Bowel sounds are normal.  Musculoskeletal: Normal range  of motion.  Neurological: She is alert and oriented to person, place, and time.  Skin: Skin is warm and dry.  Psychiatric: She has a normal mood and affect.          Assessment & Plan:  Kathleen Pacheco was seen today for establish care.  Diagnoses and associated orders for this visit:  Essential hypertension, benign - Basic Metabolic Panel - Hepatic Function Panel - TSH - CBC with Differential  Pure hypercholesterolemia - Basic Metabolic Panel - Lipid Panel - TSH - CBC with Differential  Osteoarthritis of both hands, unspecified osteoarthritis type - TSH - CBC with Differential  Overweight - TSH - CBC with Differential    followup in 4 months. Labs obtained today will notify patient of results.

## 2013-10-24 LAB — URINE CULTURE

## 2013-10-25 ENCOUNTER — Telehealth: Payer: Self-pay | Admitting: Family

## 2013-10-25 NOTE — Telephone Encounter (Signed)
emmi emailed °

## 2013-10-28 ENCOUNTER — Other Ambulatory Visit: Payer: Self-pay

## 2013-10-28 DIAGNOSIS — Z1239 Encounter for other screening for malignant neoplasm of breast: Secondary | ICD-10-CM

## 2013-10-28 DIAGNOSIS — Z1231 Encounter for screening mammogram for malignant neoplasm of breast: Secondary | ICD-10-CM

## 2013-11-12 DIAGNOSIS — M5134 Other intervertebral disc degeneration, thoracic region: Secondary | ICD-10-CM | POA: Diagnosis not present

## 2013-11-12 DIAGNOSIS — M5136 Other intervertebral disc degeneration, lumbar region: Secondary | ICD-10-CM | POA: Diagnosis not present

## 2013-11-12 DIAGNOSIS — M5137 Other intervertebral disc degeneration, lumbosacral region: Secondary | ICD-10-CM | POA: Diagnosis not present

## 2013-11-12 DIAGNOSIS — M9903 Segmental and somatic dysfunction of lumbar region: Secondary | ICD-10-CM | POA: Diagnosis not present

## 2013-11-12 DIAGNOSIS — M5030 Other cervical disc degeneration, unspecified cervical region: Secondary | ICD-10-CM | POA: Diagnosis not present

## 2013-11-12 DIAGNOSIS — M9901 Segmental and somatic dysfunction of cervical region: Secondary | ICD-10-CM | POA: Diagnosis not present

## 2013-11-12 DIAGNOSIS — M5135 Other intervertebral disc degeneration, thoracolumbar region: Secondary | ICD-10-CM | POA: Diagnosis not present

## 2013-11-12 DIAGNOSIS — M9902 Segmental and somatic dysfunction of thoracic region: Secondary | ICD-10-CM | POA: Diagnosis not present

## 2013-11-15 ENCOUNTER — Encounter: Payer: Self-pay | Admitting: Family

## 2013-11-24 ENCOUNTER — Ambulatory Visit (INDEPENDENT_AMBULATORY_CARE_PROVIDER_SITE_OTHER): Payer: Medicare Other | Admitting: Family Medicine

## 2013-11-24 ENCOUNTER — Encounter: Payer: Self-pay | Admitting: Family Medicine

## 2013-11-24 VITALS — BP 120/70 | HR 66 | Temp 97.8°F | Wt 205.0 lb

## 2013-11-24 DIAGNOSIS — R221 Localized swelling, mass and lump, neck: Secondary | ICD-10-CM | POA: Diagnosis not present

## 2013-11-24 DIAGNOSIS — R350 Frequency of micturition: Secondary | ICD-10-CM

## 2013-11-24 LAB — POCT URINALYSIS DIPSTICK
Bilirubin, UA: NEGATIVE
Glucose, UA: NEGATIVE
Ketones, UA: NEGATIVE
NITRITE UA: NEGATIVE
PH UA: 5
Protein, UA: NEGATIVE
Spec Grav, UA: <=1.005
UROBILINOGEN UA: 0.2

## 2013-11-24 MED ORDER — MIRABEGRON ER 25 MG PO TB24: 25.0000 mg | ORAL_TABLET | Freq: Every day | ORAL | Status: DC

## 2013-11-24 NOTE — Patient Instructions (Signed)
Start Myrbetriq 25 mg once daily- to help reduce urine urgency We are sending urine culture to day and follow up promptly for any fever or burning with urination.

## 2013-11-24 NOTE — Progress Notes (Signed)
Pre visit review using our clinic review tool, if applicable. No additional management support is needed unless otherwise documented below in the visit note. 

## 2013-11-24 NOTE — Progress Notes (Signed)
   Subjective:    Patient ID: Kathleen Pacheco, female    DOB: [birth year], 78 y.o.   MRN: [id number]  HPI   Patient seen for acute complaint of urine frequency for about a month. She states this has been going several times during the day and at night and she does have some urgency. No burning with urination. No recent weight changes or thirst. Recent blood work reviewed and blood sugars have been normal. She also had recent urine culture that was unremarkable. Minimal caffeine use. She denies any obstructive urinary symptoms. She has some urgency and has never been treated for that previously. She does take HCTZ but has been on this for several years. She has occasional constipation symptoms.  Patient also complains of fullness right side of neck thyroid region. She had some type of benign growth excision several years ago and has some scar tissue anterior neck. No recent appetite or weight changes other than some mild weight gain. No swallowing difficulties. No voice changes.  Past Medical History  Diagnosis Date  . Thyroid disease   . Migraine   . Hypertension   . Hx of bladder infections   . Vertigo   . Bradycardia    Past Surgical History  Procedure Laterality Date  . Abdominal hysterectomy    . Parathyroidectomy    . Gallbladder surgery    . Cholecystectomy      reports that she has never smoked. She has never used smokeless tobacco. She reports that she does not drink alcohol or use illicit drugs. family history is not on file. Allergies  Allergen Reactions  . Ace Inhibitors   . Other     Artificial sweeteners  . Penicillins     Causes yeast infection      Review of Systems  Constitutional: Negative for fever and chills.  HENT: Negative for sore throat, trouble swallowing and voice change.   Respiratory: Negative for cough and shortness of breath.   Cardiovascular: Negative for chest pain.  Gastrointestinal: Negative for abdominal pain.  Genitourinary: Positive for  urgency and frequency. Negative for dysuria and difficulty urinating.       Objective:   Physical Exam  Constitutional: She appears well-developed and well-nourished.  Neck:  Patient has some fullness right thyroid region. No cervical adenopathy noted  Cardiovascular: Normal rate and regular rhythm.   Pulmonary/Chest: Effort normal and breath sounds normal. No respiratory distress. She has no wheezes. She has no rales.  Musculoskeletal: She exhibits no edema.          Assessment & Plan:  Urine frequency and urgency. Repeat urinalysis but doubt urinary tract infection. She's not had any evidence for diabetes and does not have any other worrisome symptoms such as thirst or weight loss. She is on low-dose hydrochlorothiazide but has been on this for apparently years. If urinalysis normal, consider trial of Myrbetriq 25 mg once daily.  Would try to avoid anticholinergics with her history of frequent constipation.  Right neck fullness. Rule out nodule. Set up ultrasound soft tissue neck

## 2013-11-25 ENCOUNTER — Telehealth: Payer: Self-pay | Admitting: Family

## 2013-11-25 NOTE — Telephone Encounter (Signed)
Unfortunately, no generic. We were hesitant to use Vesicare or Detrol because of her frequent constipation issues

## 2013-11-25 NOTE — Telephone Encounter (Signed)
Pt called to say that the following med is to expensive   mirabegron ER (MYRBETRIQ) 25 MG TB24 tablet and is asking if there is a generic for the rx.Kathleen Pacheco; [place]

## 2013-11-26 LAB — URINE CULTURE
COLONY COUNT: NO GROWTH
Organism ID, Bacteria: NO GROWTH

## 2013-11-26 NOTE — Telephone Encounter (Signed)
Left message on machine for patient to return our call 

## 2013-11-29 ENCOUNTER — Ambulatory Visit: Payer: Medicare Other

## 2013-12-01 DIAGNOSIS — M5137 Other intervertebral disc degeneration, lumbosacral region: Secondary | ICD-10-CM | POA: Diagnosis not present

## 2013-12-01 DIAGNOSIS — M5135 Other intervertebral disc degeneration, thoracolumbar region: Secondary | ICD-10-CM | POA: Diagnosis not present

## 2013-12-01 DIAGNOSIS — M5136 Other intervertebral disc degeneration, lumbar region: Secondary | ICD-10-CM | POA: Diagnosis not present

## 2013-12-01 DIAGNOSIS — M9901 Segmental and somatic dysfunction of cervical region: Secondary | ICD-10-CM | POA: Diagnosis not present

## 2013-12-01 DIAGNOSIS — M9902 Segmental and somatic dysfunction of thoracic region: Secondary | ICD-10-CM | POA: Diagnosis not present

## 2013-12-01 DIAGNOSIS — M9903 Segmental and somatic dysfunction of lumbar region: Secondary | ICD-10-CM | POA: Diagnosis not present

## 2013-12-01 DIAGNOSIS — M5134 Other intervertebral disc degeneration, thoracic region: Secondary | ICD-10-CM | POA: Diagnosis not present

## 2013-12-01 DIAGNOSIS — M5030 Other cervical disc degeneration, unspecified cervical region: Secondary | ICD-10-CM | POA: Diagnosis not present

## 2013-12-02 ENCOUNTER — Telehealth: Payer: Self-pay | Admitting: Family

## 2013-12-02 MED ORDER — SOLIFENACIN SUCCINATE 5 MG PO TABS: 5.0000 mg | ORAL_TABLET | Freq: Every day | ORAL | Status: DC

## 2013-12-02 NOTE — Telephone Encounter (Signed)
Pt said the following med was to expensive mirabegron ER (MYRBETRIQ) 25 MG TB24 tablet  She is asking if there is something cheaper  Pt said she need to pick up  something else up by 2pm she is leaving town .   Pharmacy ; [place]

## 2013-12-02 NOTE — Telephone Encounter (Signed)
Informed patient about the Vesicare and Detrol medication. Pt stated that if she gets constipated that she will take a laxative.

## 2013-12-02 NOTE — Telephone Encounter (Signed)
Rx sent to pharmacy   

## 2013-12-02 NOTE — Telephone Encounter (Signed)
Switch to Vesicare 5 mg once daily

## 2013-12-13 ENCOUNTER — Other Ambulatory Visit (HOSPITAL_COMMUNITY): Payer: Self-pay | Admitting: Cardiology

## 2013-12-13 DIAGNOSIS — R7309 Other abnormal glucose: Secondary | ICD-10-CM | POA: Diagnosis not present

## 2013-12-13 DIAGNOSIS — M1990 Unspecified osteoarthritis, unspecified site: Secondary | ICD-10-CM | POA: Diagnosis not present

## 2013-12-13 DIAGNOSIS — I361 Nonrheumatic tricuspid (valve) insufficiency: Secondary | ICD-10-CM | POA: Diagnosis not present

## 2013-12-13 DIAGNOSIS — R0609 Other forms of dyspnea: Secondary | ICD-10-CM | POA: Diagnosis not present

## 2013-12-13 DIAGNOSIS — I208 Other forms of angina pectoris: Secondary | ICD-10-CM | POA: Diagnosis not present

## 2013-12-13 DIAGNOSIS — R071 Chest pain on breathing: Secondary | ICD-10-CM

## 2013-12-13 DIAGNOSIS — E785 Hyperlipidemia, unspecified: Secondary | ICD-10-CM | POA: Diagnosis not present

## 2013-12-13 DIAGNOSIS — I10 Essential (primary) hypertension: Secondary | ICD-10-CM | POA: Diagnosis not present

## 2013-12-13 DIAGNOSIS — R079 Chest pain, unspecified: Secondary | ICD-10-CM

## 2013-12-16 ENCOUNTER — Ambulatory Visit
Admission: RE | Admit: 2013-12-16 | Discharge: 2013-12-16 | Disposition: A | Discharge status: Home / Self Care | Payer: Medicare Other | Source: Ambulatory Visit

## 2013-12-16 DIAGNOSIS — M5134 Other intervertebral disc degeneration, thoracic region: Secondary | ICD-10-CM | POA: Diagnosis not present

## 2013-12-16 DIAGNOSIS — M5135 Other intervertebral disc degeneration, thoracolumbar region: Secondary | ICD-10-CM | POA: Diagnosis not present

## 2013-12-16 DIAGNOSIS — M9902 Segmental and somatic dysfunction of thoracic region: Secondary | ICD-10-CM | POA: Diagnosis not present

## 2013-12-16 DIAGNOSIS — M5030 Other cervical disc degeneration, unspecified cervical region: Secondary | ICD-10-CM | POA: Diagnosis not present

## 2013-12-16 DIAGNOSIS — Z1231 Encounter for screening mammogram for malignant neoplasm of breast: Secondary | ICD-10-CM

## 2013-12-16 DIAGNOSIS — M9901 Segmental and somatic dysfunction of cervical region: Secondary | ICD-10-CM | POA: Diagnosis not present

## 2013-12-16 DIAGNOSIS — M5137 Other intervertebral disc degeneration, lumbosacral region: Secondary | ICD-10-CM | POA: Diagnosis not present

## 2013-12-16 DIAGNOSIS — M9903 Segmental and somatic dysfunction of lumbar region: Secondary | ICD-10-CM | POA: Diagnosis not present

## 2013-12-16 DIAGNOSIS — M5136 Other intervertebral disc degeneration, lumbar region: Secondary | ICD-10-CM | POA: Diagnosis not present

## 2013-12-20 ENCOUNTER — Ambulatory Visit (HOSPITAL_COMMUNITY)
Admission: RE | Admit: 2013-12-20 | Discharge: 2013-12-20 | Disposition: A | Discharge status: Home / Self Care | Payer: Medicare Other | Source: Ambulatory Visit | Attending: Cardiology | Admitting: Cardiology

## 2013-12-20 ENCOUNTER — Other Ambulatory Visit: Payer: Self-pay | Admitting: Obstetrics and Gynecology

## 2013-12-20 ENCOUNTER — Other Ambulatory Visit: Payer: Self-pay

## 2013-12-20 DIAGNOSIS — R079 Chest pain, unspecified: Secondary | ICD-10-CM | POA: Diagnosis not present

## 2013-12-20 DIAGNOSIS — I10 Essential (primary) hypertension: Secondary | ICD-10-CM | POA: Diagnosis not present

## 2013-12-20 DIAGNOSIS — R071 Chest pain on breathing: Secondary | ICD-10-CM | POA: Diagnosis not present

## 2013-12-20 DIAGNOSIS — I208 Other forms of angina pectoris: Secondary | ICD-10-CM | POA: Diagnosis not present

## 2013-12-20 DIAGNOSIS — R0609 Other forms of dyspnea: Secondary | ICD-10-CM | POA: Diagnosis not present

## 2013-12-20 DIAGNOSIS — R928 Other abnormal and inconclusive findings on diagnostic imaging of breast: Secondary | ICD-10-CM

## 2013-12-20 MED ORDER — TECHNETIUM TC 99M SESTAMIBI GENERIC - CARDIOLITE
30.0000 | Freq: Once | INTRAVENOUS | Status: AC | PRN
  Administered 2013-12-20: 30 via INTRAVENOUS

## 2013-12-20 MED ORDER — TECHNETIUM TC 99M SESTAMIBI GENERIC - CARDIOLITE
10.0000 | Freq: Once | INTRAVENOUS | Status: AC | PRN
  Administered 2013-12-20: 10 via INTRAVENOUS

## 2013-12-20 MED ORDER — REGADENOSON 0.4 MG/5ML IV SOLN
0.4000 mg | Freq: Once | INTRAVENOUS | Status: AC
  Administered 2013-12-20: 0.4 mg via INTRAVENOUS

## 2013-12-20 MED ORDER — REGADENOSON 0.4 MG/5ML IV SOLN
INTRAVENOUS | Status: AC
  Filled 2013-12-20: qty 5

## 2013-12-23 DIAGNOSIS — M9901 Segmental and somatic dysfunction of cervical region: Secondary | ICD-10-CM | POA: Diagnosis not present

## 2013-12-23 DIAGNOSIS — M5137 Other intervertebral disc degeneration, lumbosacral region: Secondary | ICD-10-CM | POA: Diagnosis not present

## 2013-12-23 DIAGNOSIS — M5136 Other intervertebral disc degeneration, lumbar region: Secondary | ICD-10-CM | POA: Diagnosis not present

## 2013-12-23 DIAGNOSIS — M9903 Segmental and somatic dysfunction of lumbar region: Secondary | ICD-10-CM | POA: Diagnosis not present

## 2013-12-23 DIAGNOSIS — M5135 Other intervertebral disc degeneration, thoracolumbar region: Secondary | ICD-10-CM | POA: Diagnosis not present

## 2013-12-23 DIAGNOSIS — M9902 Segmental and somatic dysfunction of thoracic region: Secondary | ICD-10-CM | POA: Diagnosis not present

## 2013-12-23 DIAGNOSIS — M5134 Other intervertebral disc degeneration, thoracic region: Secondary | ICD-10-CM | POA: Diagnosis not present

## 2013-12-23 DIAGNOSIS — M5030 Other cervical disc degeneration, unspecified cervical region: Secondary | ICD-10-CM | POA: Diagnosis not present

## 2013-12-30 DIAGNOSIS — M9902 Segmental and somatic dysfunction of thoracic region: Secondary | ICD-10-CM | POA: Diagnosis not present

## 2013-12-30 DIAGNOSIS — M5134 Other intervertebral disc degeneration, thoracic region: Secondary | ICD-10-CM | POA: Diagnosis not present

## 2013-12-30 DIAGNOSIS — M5030 Other cervical disc degeneration, unspecified cervical region: Secondary | ICD-10-CM | POA: Diagnosis not present

## 2013-12-30 DIAGNOSIS — M9901 Segmental and somatic dysfunction of cervical region: Secondary | ICD-10-CM | POA: Diagnosis not present

## 2013-12-30 DIAGNOSIS — M9903 Segmental and somatic dysfunction of lumbar region: Secondary | ICD-10-CM | POA: Diagnosis not present

## 2013-12-30 DIAGNOSIS — M5136 Other intervertebral disc degeneration, lumbar region: Secondary | ICD-10-CM | POA: Diagnosis not present

## 2013-12-30 DIAGNOSIS — M5135 Other intervertebral disc degeneration, thoracolumbar region: Secondary | ICD-10-CM | POA: Diagnosis not present

## 2013-12-30 DIAGNOSIS — M5137 Other intervertebral disc degeneration, lumbosacral region: Secondary | ICD-10-CM | POA: Diagnosis not present

## 2014-01-03 DIAGNOSIS — Z961 Presence of intraocular lens: Secondary | ICD-10-CM | POA: Diagnosis not present

## 2014-01-03 DIAGNOSIS — H04123 Dry eye syndrome of bilateral lacrimal glands: Secondary | ICD-10-CM | POA: Diagnosis not present

## 2014-01-04 ENCOUNTER — Ambulatory Visit
Admission: RE | Admit: 2014-01-04 | Discharge: 2014-01-04 | Disposition: A | Discharge status: Home / Self Care | Payer: Medicare Other | Source: Ambulatory Visit | Attending: Obstetrics and Gynecology | Admitting: Obstetrics and Gynecology

## 2014-01-04 DIAGNOSIS — R928 Other abnormal and inconclusive findings on diagnostic imaging of breast: Secondary | ICD-10-CM

## 2014-01-04 DIAGNOSIS — N6012 Diffuse cystic mastopathy of left breast: Secondary | ICD-10-CM | POA: Diagnosis not present

## 2014-01-18 DIAGNOSIS — J301 Allergic rhinitis due to pollen: Secondary | ICD-10-CM | POA: Diagnosis not present

## 2014-01-18 DIAGNOSIS — J3089 Other allergic rhinitis: Secondary | ICD-10-CM | POA: Diagnosis not present

## 2014-01-18 DIAGNOSIS — J019 Acute sinusitis, unspecified: Secondary | ICD-10-CM | POA: Diagnosis not present

## 2014-01-18 DIAGNOSIS — R0602 Shortness of breath: Secondary | ICD-10-CM | POA: Diagnosis not present

## 2014-01-19 ENCOUNTER — Telehealth: Payer: Self-pay

## 2014-01-19 NOTE — Telephone Encounter (Signed)
[facility] called and stated that patient refused to get the Ultrasound. Pt stated that she is feeling fine.

## 2014-02-22 ENCOUNTER — Ambulatory Visit: Payer: Medicare Other | Admitting: Family

## 2014-02-24 DIAGNOSIS — M9901 Segmental and somatic dysfunction of cervical region: Secondary | ICD-10-CM | POA: Diagnosis not present

## 2014-02-24 DIAGNOSIS — M9903 Segmental and somatic dysfunction of lumbar region: Secondary | ICD-10-CM | POA: Diagnosis not present

## 2014-02-24 DIAGNOSIS — M5030 Other cervical disc degeneration, unspecified cervical region: Secondary | ICD-10-CM | POA: Diagnosis not present

## 2014-02-24 DIAGNOSIS — M5135 Other intervertebral disc degeneration, thoracolumbar region: Secondary | ICD-10-CM | POA: Diagnosis not present

## 2014-02-24 DIAGNOSIS — M9902 Segmental and somatic dysfunction of thoracic region: Secondary | ICD-10-CM | POA: Diagnosis not present

## 2014-02-24 DIAGNOSIS — M5134 Other intervertebral disc degeneration, thoracic region: Secondary | ICD-10-CM | POA: Diagnosis not present

## 2014-02-24 DIAGNOSIS — M5136 Other intervertebral disc degeneration, lumbar region: Secondary | ICD-10-CM | POA: Diagnosis not present

## 2014-02-24 DIAGNOSIS — M5137 Other intervertebral disc degeneration, lumbosacral region: Secondary | ICD-10-CM | POA: Diagnosis not present

## 2014-03-03 DIAGNOSIS — M5030 Other cervical disc degeneration, unspecified cervical region: Secondary | ICD-10-CM | POA: Diagnosis not present

## 2014-03-03 DIAGNOSIS — M9901 Segmental and somatic dysfunction of cervical region: Secondary | ICD-10-CM | POA: Diagnosis not present

## 2014-03-03 DIAGNOSIS — M9903 Segmental and somatic dysfunction of lumbar region: Secondary | ICD-10-CM | POA: Diagnosis not present

## 2014-03-03 DIAGNOSIS — M5136 Other intervertebral disc degeneration, lumbar region: Secondary | ICD-10-CM | POA: Diagnosis not present

## 2014-03-03 DIAGNOSIS — M5137 Other intervertebral disc degeneration, lumbosacral region: Secondary | ICD-10-CM | POA: Diagnosis not present

## 2014-03-03 DIAGNOSIS — M5135 Other intervertebral disc degeneration, thoracolumbar region: Secondary | ICD-10-CM | POA: Diagnosis not present

## 2014-03-03 DIAGNOSIS — M5134 Other intervertebral disc degeneration, thoracic region: Secondary | ICD-10-CM | POA: Diagnosis not present

## 2014-03-03 DIAGNOSIS — M9902 Segmental and somatic dysfunction of thoracic region: Secondary | ICD-10-CM | POA: Diagnosis not present

## 2014-03-17 DIAGNOSIS — R7309 Other abnormal glucose: Secondary | ICD-10-CM | POA: Diagnosis not present

## 2014-03-17 DIAGNOSIS — R0789 Other chest pain: Secondary | ICD-10-CM | POA: Diagnosis not present

## 2014-03-17 DIAGNOSIS — E785 Hyperlipidemia, unspecified: Secondary | ICD-10-CM | POA: Diagnosis not present

## 2014-03-17 DIAGNOSIS — R0609 Other forms of dyspnea: Secondary | ICD-10-CM | POA: Diagnosis not present

## 2014-03-17 DIAGNOSIS — M1990 Unspecified osteoarthritis, unspecified site: Secondary | ICD-10-CM | POA: Diagnosis not present

## 2014-03-17 DIAGNOSIS — I10 Essential (primary) hypertension: Secondary | ICD-10-CM | POA: Diagnosis not present

## 2014-03-17 DIAGNOSIS — R55 Syncope and collapse: Secondary | ICD-10-CM | POA: Diagnosis not present

## 2014-03-17 DIAGNOSIS — I361 Nonrheumatic tricuspid (valve) insufficiency: Secondary | ICD-10-CM | POA: Diagnosis not present

## 2014-03-30 DIAGNOSIS — R05 Cough: Secondary | ICD-10-CM | POA: Diagnosis not present

## 2014-04-14 DIAGNOSIS — M5030 Other cervical disc degeneration, unspecified cervical region: Secondary | ICD-10-CM | POA: Diagnosis not present

## 2014-04-14 DIAGNOSIS — M9902 Segmental and somatic dysfunction of thoracic region: Secondary | ICD-10-CM | POA: Diagnosis not present

## 2014-04-14 DIAGNOSIS — I10 Essential (primary) hypertension: Secondary | ICD-10-CM | POA: Diagnosis not present

## 2014-04-14 DIAGNOSIS — M940 Chondrocostal junction syndrome [Tietze]: Secondary | ICD-10-CM | POA: Diagnosis not present

## 2014-04-14 DIAGNOSIS — E785 Hyperlipidemia, unspecified: Secondary | ICD-10-CM | POA: Diagnosis not present

## 2014-04-14 DIAGNOSIS — M1990 Unspecified osteoarthritis, unspecified site: Secondary | ICD-10-CM | POA: Diagnosis not present

## 2014-04-14 DIAGNOSIS — M5134 Other intervertebral disc degeneration, thoracic region: Secondary | ICD-10-CM | POA: Diagnosis not present

## 2014-04-14 DIAGNOSIS — M5135 Other intervertebral disc degeneration, thoracolumbar region: Secondary | ICD-10-CM | POA: Diagnosis not present

## 2014-04-14 DIAGNOSIS — M5137 Other intervertebral disc degeneration, lumbosacral region: Secondary | ICD-10-CM | POA: Diagnosis not present

## 2014-04-14 DIAGNOSIS — I361 Nonrheumatic tricuspid (valve) insufficiency: Secondary | ICD-10-CM | POA: Diagnosis not present

## 2014-04-14 DIAGNOSIS — R55 Syncope and collapse: Secondary | ICD-10-CM | POA: Diagnosis not present

## 2014-04-14 DIAGNOSIS — M9903 Segmental and somatic dysfunction of lumbar region: Secondary | ICD-10-CM | POA: Diagnosis not present

## 2014-04-14 DIAGNOSIS — R7309 Other abnormal glucose: Secondary | ICD-10-CM | POA: Diagnosis not present

## 2014-04-14 DIAGNOSIS — M5136 Other intervertebral disc degeneration, lumbar region: Secondary | ICD-10-CM | POA: Diagnosis not present

## 2014-04-14 DIAGNOSIS — M9901 Segmental and somatic dysfunction of cervical region: Secondary | ICD-10-CM | POA: Diagnosis not present

## 2014-05-26 DIAGNOSIS — M5030 Other cervical disc degeneration, unspecified cervical region: Secondary | ICD-10-CM | POA: Diagnosis not present

## 2014-05-26 DIAGNOSIS — M9901 Segmental and somatic dysfunction of cervical region: Secondary | ICD-10-CM | POA: Diagnosis not present

## 2014-05-26 DIAGNOSIS — M5136 Other intervertebral disc degeneration, lumbar region: Secondary | ICD-10-CM | POA: Diagnosis not present

## 2014-05-26 DIAGNOSIS — M9902 Segmental and somatic dysfunction of thoracic region: Secondary | ICD-10-CM | POA: Diagnosis not present

## 2014-05-26 DIAGNOSIS — M9903 Segmental and somatic dysfunction of lumbar region: Secondary | ICD-10-CM | POA: Diagnosis not present

## 2014-05-26 DIAGNOSIS — M5135 Other intervertebral disc degeneration, thoracolumbar region: Secondary | ICD-10-CM | POA: Diagnosis not present

## 2014-05-26 DIAGNOSIS — M5134 Other intervertebral disc degeneration, thoracic region: Secondary | ICD-10-CM | POA: Diagnosis not present

## 2014-05-26 DIAGNOSIS — M5137 Other intervertebral disc degeneration, lumbosacral region: Secondary | ICD-10-CM | POA: Diagnosis not present

## 2014-06-07 DIAGNOSIS — M5030 Other cervical disc degeneration, unspecified cervical region: Secondary | ICD-10-CM | POA: Diagnosis not present

## 2014-06-07 DIAGNOSIS — M5134 Other intervertebral disc degeneration, thoracic region: Secondary | ICD-10-CM | POA: Diagnosis not present

## 2014-06-07 DIAGNOSIS — M9901 Segmental and somatic dysfunction of cervical region: Secondary | ICD-10-CM | POA: Diagnosis not present

## 2014-06-07 DIAGNOSIS — M5136 Other intervertebral disc degeneration, lumbar region: Secondary | ICD-10-CM | POA: Diagnosis not present

## 2014-06-07 DIAGNOSIS — M791 Myalgia: Secondary | ICD-10-CM | POA: Diagnosis not present

## 2014-06-07 DIAGNOSIS — M9902 Segmental and somatic dysfunction of thoracic region: Secondary | ICD-10-CM | POA: Diagnosis not present

## 2014-06-07 DIAGNOSIS — M9903 Segmental and somatic dysfunction of lumbar region: Secondary | ICD-10-CM | POA: Diagnosis not present

## 2014-06-16 DIAGNOSIS — M5134 Other intervertebral disc degeneration, thoracic region: Secondary | ICD-10-CM | POA: Diagnosis not present

## 2014-06-16 DIAGNOSIS — M9903 Segmental and somatic dysfunction of lumbar region: Secondary | ICD-10-CM | POA: Diagnosis not present

## 2014-06-16 DIAGNOSIS — M5030 Other cervical disc degeneration, unspecified cervical region: Secondary | ICD-10-CM | POA: Diagnosis not present

## 2014-06-16 DIAGNOSIS — M9902 Segmental and somatic dysfunction of thoracic region: Secondary | ICD-10-CM | POA: Diagnosis not present

## 2014-06-16 DIAGNOSIS — M5136 Other intervertebral disc degeneration, lumbar region: Secondary | ICD-10-CM | POA: Diagnosis not present

## 2014-06-16 DIAGNOSIS — M791 Myalgia: Secondary | ICD-10-CM | POA: Diagnosis not present

## 2014-06-16 DIAGNOSIS — M9901 Segmental and somatic dysfunction of cervical region: Secondary | ICD-10-CM | POA: Diagnosis not present

## 2014-06-24 DIAGNOSIS — M5030 Other cervical disc degeneration, unspecified cervical region: Secondary | ICD-10-CM | POA: Diagnosis not present

## 2014-06-24 DIAGNOSIS — M9901 Segmental and somatic dysfunction of cervical region: Secondary | ICD-10-CM | POA: Diagnosis not present

## 2014-06-24 DIAGNOSIS — M791 Myalgia: Secondary | ICD-10-CM | POA: Diagnosis not present

## 2014-06-24 DIAGNOSIS — M9903 Segmental and somatic dysfunction of lumbar region: Secondary | ICD-10-CM | POA: Diagnosis not present

## 2014-06-24 DIAGNOSIS — M9902 Segmental and somatic dysfunction of thoracic region: Secondary | ICD-10-CM | POA: Diagnosis not present

## 2014-06-24 DIAGNOSIS — M5134 Other intervertebral disc degeneration, thoracic region: Secondary | ICD-10-CM | POA: Diagnosis not present

## 2014-06-24 DIAGNOSIS — M5136 Other intervertebral disc degeneration, lumbar region: Secondary | ICD-10-CM | POA: Diagnosis not present

## 2014-07-06 DIAGNOSIS — E785 Hyperlipidemia, unspecified: Secondary | ICD-10-CM | POA: Diagnosis not present

## 2014-07-06 DIAGNOSIS — R7309 Other abnormal glucose: Secondary | ICD-10-CM | POA: Diagnosis not present

## 2014-07-06 DIAGNOSIS — M1990 Unspecified osteoarthritis, unspecified site: Secondary | ICD-10-CM | POA: Diagnosis not present

## 2014-07-06 DIAGNOSIS — I361 Nonrheumatic tricuspid (valve) insufficiency: Secondary | ICD-10-CM | POA: Diagnosis not present

## 2014-07-06 DIAGNOSIS — I10 Essential (primary) hypertension: Secondary | ICD-10-CM | POA: Diagnosis not present

## 2014-07-14 DIAGNOSIS — M5136 Other intervertebral disc degeneration, lumbar region: Secondary | ICD-10-CM | POA: Diagnosis not present

## 2014-07-14 DIAGNOSIS — M791 Myalgia: Secondary | ICD-10-CM | POA: Diagnosis not present

## 2014-07-14 DIAGNOSIS — M5134 Other intervertebral disc degeneration, thoracic region: Secondary | ICD-10-CM | POA: Diagnosis not present

## 2014-07-14 DIAGNOSIS — M9903 Segmental and somatic dysfunction of lumbar region: Secondary | ICD-10-CM | POA: Diagnosis not present

## 2014-07-14 DIAGNOSIS — M9902 Segmental and somatic dysfunction of thoracic region: Secondary | ICD-10-CM | POA: Diagnosis not present

## 2014-07-14 DIAGNOSIS — M5030 Other cervical disc degeneration, unspecified cervical region: Secondary | ICD-10-CM | POA: Diagnosis not present

## 2014-07-14 DIAGNOSIS — M9901 Segmental and somatic dysfunction of cervical region: Secondary | ICD-10-CM | POA: Diagnosis not present

## 2014-07-17 ENCOUNTER — Ambulatory Visit (INDEPENDENT_AMBULATORY_CARE_PROVIDER_SITE_OTHER): Payer: Medicare Other | Admitting: Internal Medicine

## 2014-07-17 VITALS — BP 108/72 | HR 63 | Temp 98.4°F | Resp 18 | Ht 63.0 in | Wt 205.0 lb

## 2014-07-17 DIAGNOSIS — H9202 Otalgia, left ear: Secondary | ICD-10-CM | POA: Diagnosis not present

## 2014-07-17 MED ORDER — FLUCONAZOLE 150 MG PO TABS: 150.0000 mg | ORAL_TABLET | Freq: Once | ORAL | Status: DC

## 2014-07-17 MED ORDER — AMOXICILLIN 875 MG PO TABS: 875.0000 mg | ORAL_TABLET | Freq: Two times a day (BID) | ORAL | Status: DC

## 2014-07-17 MED ORDER — NEOMYCIN-POLYMYXIN-HC 3.5-10000-1 OT SUSP: 3.0000 [drp] | Freq: Four times a day (QID) | OTIC | Status: DC

## 2014-07-17 NOTE — Progress Notes (Addendum)
Subjective:  This chart was scribed for Kathleen Lin, MD by [facility], medical scribe at [facility].The patient was seen in exam room 04 and the patient's care was started at 9:11 AM.   Patient ID: Kathleen Pacheco, female    DOB: Nov 15, 1935, 79 y.o.   MRN: [id number] Chief Complaint  Patient presents with  . Ear Pain    left, since last night  . Sore Throat    minor   HPI  HPI Comments: Kathleen Pacheco is a 80 y.o. female who presents to [facility] complaining of left ear pain with no associated  hearing loss, acute onset last night. She denies fever, rhinorrhea, congestion, sore throat, trouble swallowing, right ear pain. She has some tenderness on the left side of the throat when she swallows.  Past Medical History  Diagnosis Date  . Thyroid disease   . Migraine   . Hypertension   . Hx of bladder infections   . Vertigo   . Bradycardia    Allergies  Allergen Reactions  . Ace Inhibitors   . Other     Artificial sweeteners  . Penicillins     Causes yeast infection   Prior to Admission medications   Medication Sig Start Date End Date Taking? Authorizing Provider  amLODipine (NORVASC) 10 MG tablet Take 10 mg by mouth daily.   Yes Historical Provider, MD  aspirin EC 81 MG tablet Take 81 mg by mouth daily.   Yes Historical Provider, MD  hydrochlorothiazide (HYDRODIURIL) 25 MG tablet Take 25 mg by mouth daily.   Yes Historical Provider, MD  meloxicam (MOBIC) 15 MG tablet Take 15 mg by mouth daily.  06/28/13  Yes Historical Provider, MD  rosuvastatin (CRESTOR) 10 MG tablet Take 10 mg by mouth daily.   Yes Historical Provider, MD  KLOR-CON M20 20 MEQ tablet Take 20 mEq by mouth daily.  05/03/13   Historical Provider, MD  solifenacin (VESICARE) 5 MG tablet Take 1 tablet (5 mg total) by mouth daily. 12/02/13   [name], MD   Review of Systems  Constitutional: Negative for fever and chills.  HENT: Positive for ear pain.  Negative for congestion, hearing loss, mouth sores, rhinorrhea, sore throat and trouble swallowing.    no cough    Objective:  BP 108/72 mmHg  Pulse 63  Temp(Src) 98.4 F (36.9 C)  Resp 18  Ht 5\' 3"  (1.6 m)  Wt 205 lb (92.987 kg)  BMI 36.32 kg/m2  SpO2 98% Physical Exam  Constitutional: She is oriented to person, place, and time. She appears well-developed and well-nourished. No distress.  HENT:  Head: Normocephalic and atraumatic.  Right Ear: External ear normal.  Nose: Nose normal.  Mouth/Throat: Oropharynx is clear and moist.  Tender tragus pull on the left The canal is tender to touch with the speculum without obvious external otitis The TM is dull and slightly red around the edges Hearing is present  Eyes: Conjunctivae are normal. Pupils are equal, round, and reactive to light.  Neck: Normal range of motion.  Cardiovascular: Normal rate.   Pulmonary/Chest: Effort normal. No respiratory distress.  Musculoskeletal: Normal range of motion.  Lymphadenopathy:    She has no cervical adenopathy.  Neurological: She is alert and oriented to person, place, and time.  Skin: Skin is warm and dry.  Psychiatric: She has a normal mood and affect. Her behavior is normal.  Nursing note and vitals reviewed. BP 108/72 mmHg  Pulse 63  Temp(Src) 98.4 F (36.9 C)  Resp 18  Ht 5\' 3"  (1.6 m)  Wt 205 lb (92.987 kg)  BMI 36.32 kg/m2  SpO2 98%      Assessment & Plan:  Otalgia of left ear  relatively recent acute onset---? Canal abscess.? Early otitis media. Meds ordered this encounter  Medications  . amoxicillin (AMOXIL) 875 MG tablet    Sig: Take 1 tablet (875 mg total) by mouth 2 (two) times daily.    Dispense:  20 tablet    Refill:  0  . neomycin-polymyxin-hydrocortisone (CORTISPORIN) 3.5-10000-1 otic suspension    Sig: Place 3 drops into the left ear 4 (four) times daily.    Dispense:  10 mL    Refill:  0  . fluconazole (DIFLUCAN) 150 MG tablet    Sig: Take 1 tablet (150 mg  total) by mouth once.    Dispense:  1 tablet    Refill:  1   Close follow-up///recheck in 72 hours if symptoms not resolved   I have completed the patient encounter in its entirety as documented by the scribe, with editing by me where necessary. Robert P. Laney Pastor, M.D.

## 2014-08-11 DIAGNOSIS — H04123 Dry eye syndrome of bilateral lacrimal glands: Secondary | ICD-10-CM | POA: Diagnosis not present

## 2014-09-03 ENCOUNTER — Ambulatory Visit (INDEPENDENT_AMBULATORY_CARE_PROVIDER_SITE_OTHER): Payer: Medicare Other | Admitting: Internal Medicine

## 2014-09-03 ENCOUNTER — Ambulatory Visit (INDEPENDENT_AMBULATORY_CARE_PROVIDER_SITE_OTHER): Payer: Medicare Other

## 2014-09-03 VITALS — BP 120/70 | HR 64 | Temp 98.3°F | Ht 63.0 in | Wt 208.1 lb

## 2014-09-03 DIAGNOSIS — M10072 Idiopathic gout, left ankle and foot: Secondary | ICD-10-CM

## 2014-09-03 DIAGNOSIS — M25572 Pain in left ankle and joints of left foot: Secondary | ICD-10-CM | POA: Diagnosis not present

## 2014-09-03 DIAGNOSIS — M109 Gout, unspecified: Secondary | ICD-10-CM

## 2014-09-03 MED ORDER — COLCHICINE 0.6 MG PO CAPS: ORAL_CAPSULE | ORAL | Status: DC

## 2014-09-03 NOTE — Progress Notes (Addendum)
Subjective:  This chart was scribed for [name] MD, by Tamsen Roers, at [facility].  This patient was seen in room 7 and the patient's care was started at 1:19 PM.    Patient ID: Kathleen Pacheco, female    DOB: May 22, 1935, 79 y.o.   MRN: [id number] Chief Complaint  Patient presents with  . Foot Swelling    C/O left foot swelling. Happened on Thurs while out shopping. Felt like something bit her foot    HPI HPI Comments: Kathleen Pacheco is a 79 y.o. female who presents to the [facility] complaining of "excrutiating" sharp left foot pain onset two days ago while shopping.  Patient is not able to move her toes and has associated symptoms of swelling.  She has taken meloxicam to alleviate her symptoms but denies any relief.  She has no other complaints or concerns today.    Patient Active Problem List   Diagnosis Date Noted  . Osteoarthrosis, hand 10/22/2013  . DIZZINESS 02/02/2009  . HYPERCHOLESTEROLEMIA 01/25/2009  . HYPERTENSION 01/25/2009   Past Medical History  Diagnosis Date  . Thyroid disease   . Migraine   . Hypertension   . Hx of bladder infections   . Vertigo   . Bradycardia    Past Surgical History  Procedure Laterality Date  . Abdominal hysterectomy    . Parathyroidectomy    . Gallbladder surgery    . Cholecystectomy     Allergies  Allergen Reactions  . Ace Inhibitors   . Other     Artificial sweeteners  . Penicillins     Causes yeast infection   Prior to Admission medications   Medication Sig Start Date End Date Taking? Authorizing Provider  amLODipine (NORVASC) 10 MG tablet Take 10 mg by mouth daily.   Yes Historical Provider, MD  aspirin EC 81 MG tablet Take 81 mg by mouth daily.   Yes Historical Provider, MD  hydrochlorothiazide (HYDRODIURIL) 25 MG tablet Take 25 mg by mouth daily.   Yes Historical Provider, MD  KLOR-CON M20 20 MEQ tablet Take 20 mEq by mouth daily.  05/03/13  Yes Historical  Provider, MD  meloxicam (MOBIC) 15 MG tablet Take 15 mg by mouth daily.  06/28/13  Yes Historical Provider, MD  rosuvastatin (CRESTOR) 10 MG tablet Take 10 mg by mouth daily.   Yes Historical Provider, MD  solifenacin (VESICARE) 5 MG tablet Take 1 tablet (5 mg total) by mouth daily. Patient not taking: Reported on 09/03/2014 12/02/13   [name], MD   Social History   Social History  . Marital Status: Divorced    Spouse Name: N/A  . Number of Children: N/A  . Years of Education: N/A   Occupational History  . Not on file.   Social History Main Topics  . Smoking status: Never Smoker   . Smokeless tobacco: Never Used  . Alcohol Use: No  . Drug Use: No  . Sexual Activity: No     Comment: HYST    Other Topics Concern  . Not on file   Social History Narrative        Review of Systems  Constitutional: Negative for fever and chills.  Respiratory: Negative for cough and shortness of breath.   Gastrointestinal: Negative for nausea and vomiting.  Musculoskeletal: Positive for joint swelling, arthralgias and gait problem. Negative for back pain, neck pain and neck stiffness.       Objective:   Physical  Exam  Musculoskeletal:  The left foot is swollen dorsally with redness over the forefoot and tenderness to palpation over the toes and metatarsals in both malleolus. There is no pain with ankle range of motion. The Achilles is intact.    Filed Vitals:   09/03/14 1214  BP: 120/70  Pulse: 64  Temp: 98.3 F (36.8 C)  TempSrc: Oral  Height: 5\' 3"  (1.6 m)  Weight: 208 lb 2 oz (94.405 kg)  SpO2: 98%    [facility] reading (PRIMARY) by  Dr. Laney Pastor no fracture or acute injury of the left foot       Assessment & Plan:  I have completed the patient encounter in its entirety as documented by the scribe, with editing by me where necessary. Robert P. Laney Pastor, M.D.  Pain in joint, ankle and foot, left - Plan: DG Foot Complete Left  Acute gout of left foot, unspecified  cause  Meds ordered this encounter  Medications  . Colchicine 0.6 MG CAPS    Sig: Twice a day til foot seems well then once a day for 3 more days    Dispense:  15 capsule    Refill:  0   F/u w PCP

## 2014-09-03 NOTE — Patient Instructions (Signed)
Have lab do uric acid level with next HTN bloodwork

## 2014-09-14 DIAGNOSIS — M25572 Pain in left ankle and joints of left foot: Secondary | ICD-10-CM | POA: Diagnosis not present

## 2014-09-20 DIAGNOSIS — M5136 Other intervertebral disc degeneration, lumbar region: Secondary | ICD-10-CM | POA: Diagnosis not present

## 2014-09-20 DIAGNOSIS — M791 Myalgia: Secondary | ICD-10-CM | POA: Diagnosis not present

## 2014-09-20 DIAGNOSIS — M79674 Pain in right toe(s): Secondary | ICD-10-CM | POA: Diagnosis not present

## 2014-09-20 DIAGNOSIS — M9902 Segmental and somatic dysfunction of thoracic region: Secondary | ICD-10-CM | POA: Diagnosis not present

## 2014-09-20 DIAGNOSIS — M5134 Other intervertebral disc degeneration, thoracic region: Secondary | ICD-10-CM | POA: Diagnosis not present

## 2014-09-20 DIAGNOSIS — M9903 Segmental and somatic dysfunction of lumbar region: Secondary | ICD-10-CM | POA: Diagnosis not present

## 2014-09-20 DIAGNOSIS — M9901 Segmental and somatic dysfunction of cervical region: Secondary | ICD-10-CM | POA: Diagnosis not present

## 2014-09-20 DIAGNOSIS — M5030 Other cervical disc degeneration, unspecified cervical region: Secondary | ICD-10-CM | POA: Diagnosis not present

## 2014-10-05 ENCOUNTER — Ambulatory Visit (INDEPENDENT_AMBULATORY_CARE_PROVIDER_SITE_OTHER): Payer: Medicare Other | Admitting: Family Medicine

## 2014-10-05 VITALS — BP 110/70 | HR 60 | Temp 97.7°F | Resp 16 | Ht 63.0 in | Wt 207.0 lb

## 2014-10-05 DIAGNOSIS — Z1211 Encounter for screening for malignant neoplasm of colon: Secondary | ICD-10-CM | POA: Diagnosis not present

## 2014-10-05 DIAGNOSIS — K5901 Slow transit constipation: Secondary | ICD-10-CM

## 2014-10-05 DIAGNOSIS — R1013 Epigastric pain: Secondary | ICD-10-CM | POA: Diagnosis not present

## 2014-10-05 DIAGNOSIS — K219 Gastro-esophageal reflux disease without esophagitis: Secondary | ICD-10-CM | POA: Diagnosis not present

## 2014-10-05 DIAGNOSIS — Z23 Encounter for immunization: Secondary | ICD-10-CM

## 2014-10-05 LAB — POCT URINALYSIS DIP (MANUAL ENTRY)
Bilirubin, UA: NEGATIVE
GLUCOSE UA: NEGATIVE
Ketones, POC UA: NEGATIVE
Nitrite, UA: NEGATIVE
Protein Ur, POC: NEGATIVE
SPEC GRAV UA: 1.025
UROBILINOGEN UA: 0.2
pH, UA: 6

## 2014-10-05 LAB — POCT CBC
Granulocyte percent: 38.8 %G (ref 37–80)
HEMATOCRIT: 45 % (ref 37.7–47.9)
Hemoglobin: 13.9 g/dL (ref 12.2–16.2)
LYMPH, POC: 2.9 (ref 0.6–3.4)
MCH, POC: 26.1 pg — AB (ref 27–31.2)
MCHC: 31 g/dL — AB (ref 31.8–35.4)
MCV: 84.3 fL (ref 80–97)
MID (cbc): 0.4 (ref 0–0.9)
MPV: 8.8 fL (ref 0–99.8)
POC GRANULOCYTE: 2.1 (ref 2–6.9)
POC LYMPH %: 54.4 % — AB (ref 10–50)
POC MID %: 6.8 % (ref 0–12)
Platelet Count, POC: 185 10*3/uL (ref 142–424)
RBC: 5.34 M/uL (ref 4.04–5.48)
RDW, POC: 15.8 %
WBC: 5.3 10*3/uL (ref 4.6–10.2)

## 2014-10-05 MED ORDER — RANITIDINE HCL 150 MG PO TABS: 150.0000 mg | ORAL_TABLET | Freq: Two times a day (BID) | ORAL | Status: DC

## 2014-10-05 NOTE — Patient Instructions (Signed)
Constipation  Constipation is when a person has fewer than three bowel movements a week, has difficulty having a bowel movement, or has stools that are dry, hard, or larger than normal. As people grow older, constipation is more common. If you try to fix constipation with medicines that make you have a bowel movement (laxatives), the problem may get worse. Long-term laxative use may cause the muscles of the colon to become weak. A low-fiber diet, not taking in enough fluids, and taking certain medicines may make constipation worse.   CAUSES   · Certain medicines, such as antidepressants, pain medicine, iron supplements, antacids, and water pills.    · Certain diseases, such as diabetes, irritable bowel syndrome (IBS), thyroid disease, or depression.    · Not drinking enough water.    · Not eating enough fiber-rich foods.    · Stress or travel.    · Lack of physical activity or exercise.    · Ignoring the urge to have a bowel movement.    · Using laxatives too much.    SIGNS AND SYMPTOMS   · Having fewer than three bowel movements a week.    · Straining to have a bowel movement.    · Having stools that are hard, dry, or larger than normal.    · Feeling full or bloated.    · Pain in the lower abdomen.    · Not feeling relief after having a bowel movement.    DIAGNOSIS   Your health care Kathleen Pacheco will take a medical history and perform a physical exam. Further testing may be done for severe constipation. Some tests may include:  · A barium enema X-ray to examine your rectum, colon, and, sometimes, your small intestine.    · A sigmoidoscopy to examine your lower colon.    · A colonoscopy to examine your entire colon.  TREATMENT   Treatment will depend on the severity of your constipation and what is causing it. Some dietary treatments include drinking more fluids and eating more fiber-rich foods. Lifestyle treatments may include regular exercise. If these diet and lifestyle recommendations do not help, your health care  Kathleen Pacheco may recommend taking over-the-counter laxative medicines to help you have bowel movements. Prescription medicines may be prescribed if over-the-counter medicines do not work.   HOME CARE INSTRUCTIONS   · Eat foods that have a lot of fiber, such as fruits, vegetables, whole grains, and beans.  · Limit foods high in fat and processed sugars, such as french fries, hamburgers, cookies, candies, and soda.    · A fiber supplement may be added to your diet if you cannot get enough fiber from foods.    · Drink enough fluids to keep your urine clear or pale yellow.    · Exercise regularly or as directed by your health care Kathleen Pacheco.    · Go to the restroom when you have the urge to go. Do not hold it.    · Only take over-the-counter or prescription medicines as directed by your health care Kathleen Pacheco. Do not take other medicines for constipation without talking to your health care Kathleen Pacheco first.    SEEK IMMEDIATE MEDICAL CARE IF:   · You have bright red blood in your stool.    · Your constipation lasts for more than 4 days or gets worse.    · You have abdominal or rectal pain.    · You have thin, pencil-like stools.    · You have unexplained weight loss.  MAKE SURE YOU:   · Understand these instructions.  · Will watch your condition.  · Will get help right away if you are not   doing well or get worse.  Document Released: 09/29/2003 Document Revised: 01/05/2013 Document Reviewed: 10/12/2012  ExitCare® Patient Information ©2015 [employer]. This information is not intended to replace advice given to you by your health care Kathleen Pacheco. Make sure you discuss any questions you have with your health care Kathleen Pacheco.

## 2014-10-05 NOTE — Progress Notes (Addendum)
Subjective:  This chart was scribed for Kathleen Forts, MD by Thea Alken, ED Scribe. This patient was seen in room 10 and the patient's care was started at 5:30 PM.   Patient ID: Kathleen Pacheco, female    DOB: Nov 16, 1935, 79 y.o.   MRN: [id number]  HPI   Chief Complaint  Patient presents with  . Constipation    x 2-3 weeks  . Flu Vaccine   HPI Comments: Kathleen Pacheco is a 79 y.o. female who presents to the [facility] complaining of constipation for 3 weeks. Pt has had trouble with constipation in the past. With her current constipation she's had upper abdominal pain for 2 weeks, abdominal bloating and flatulence consisting of gas and burping. Last BM was yesterday consisting of 2 large BM's and 2 small BM after taking laxatives with relief to bloating and tenderness. She had some bright red blood with wiping but no blood in stool. Prior to [facility] yesterday, last BM was 5 days ago. She takes a stool softener as needed. She has hx of cholecystectomy and hysterectomy. She denies fever, chills, sweats, nausea, emesis, loss of appetite, dysuria, urgency, hematuria, frequency, CP, new SOB.  Colonoscopy is due per patient and would like referral.  She currently does not have a primary physician.   She is seen by cardiologist every 3 months. Hx of HTN and leak in heart valve.    She agrees to flu shot and pneumonia vaccine.   Patient Active Problem List   Diagnosis Date Noted  . Osteoarthrosis, hand 10/22/2013  . DIZZINESS 02/02/2009  . HYPERCHOLESTEROLEMIA 01/25/2009  . HYPERTENSION 01/25/2009   Past Medical History  Diagnosis Date  . Thyroid disease   . Migraine   . Hypertension   . Hx of bladder infections   . Vertigo   . Bradycardia   . Hyperlipidemia   . Gout    Past Surgical History  Procedure Laterality Date  . Abdominal hysterectomy    . Parathyroidectomy    . Gallbladder surgery    . Cholecystectomy      Prior to Admission medications   Medication  Sig Start Date End Date Taking? Authorizing Provider  amLODipine (NORVASC) 10 MG tablet Take 10 mg by mouth daily.   Yes Historical Provider, MD  aspirin EC 81 MG tablet Take 81 mg by mouth daily.   Yes Historical Provider, MD  hydrochlorothiazide (HYDRODIURIL) 25 MG tablet Take 25 mg by mouth daily.   Yes Historical Provider, MD  KLOR-CON M20 20 MEQ tablet Take 20 mEq by mouth daily.  05/03/13  Yes Historical Provider, MD  meloxicam (MOBIC) 15 MG tablet Take 15 mg by mouth daily.  06/28/13  Yes Historical Provider, MD  Colchicine 0.6 MG CAPS Twice a day til foot seems well then once a day for 3 more days Patient not taking: Reported on 10/05/2014 09/03/14   Kathleen Koyanagi, MD  rosuvastatin (CRESTOR) 10 MG tablet Take 10 mg by mouth daily.    Historical Provider, MD  solifenacin (VESICARE) 5 MG tablet Take 1 tablet (5 mg total) by mouth daily. Patient not taking: Reported on 09/03/2014 12/02/13   Kathleen Post, MD   Review of Systems  Constitutional: Negative for fever, chills and diaphoresis.  Respiratory: Negative for shortness of breath.   Cardiovascular: Negative for chest pain, palpitations and leg swelling.  Gastrointestinal: Positive for abdominal pain, constipation, abdominal distention and anal bleeding. Negative for nausea, vomiting, diarrhea, blood in stool and  rectal pain.  Genitourinary: Negative for dysuria, urgency, frequency, hematuria, flank pain, decreased urine volume, difficulty urinating and pelvic pain.   Objective:   Physical Exam  Constitutional: She is oriented to person, place, and time. She appears well-developed and well-nourished. No distress.  HENT:  Head: Normocephalic and atraumatic.  Mouth/Throat: Oropharynx is clear and moist.  Eyes: Conjunctivae and EOM are normal. Pupils are equal, round, and reactive to light.  Neck: Neck supple. No thyromegaly present.  Cardiovascular: Normal rate, regular rhythm, normal heart sounds and intact distal pulses.  Exam  reveals no gallop and no friction rub.   No murmur heard. Pulmonary/Chest: Effort normal and breath sounds normal. No respiratory distress. She has no wheezes. She has no rales. She exhibits no tenderness.  Abdominal: Soft. Bowel sounds are normal. She exhibits no distension and no mass. There is no tenderness. There is no rebound and no guarding.  Genitourinary: Rectum normal. Rectal exam shows no mass, no tenderness and anal tone normal.  Musculoskeletal: Normal range of motion.  Neurological: She is alert and oriented to person, place, and time.  Skin: Skin is warm and dry. She is not diaphoretic.  Psychiatric: She has a normal mood and affect. Her behavior is normal.  Nursing note and vitals reviewed.  Filed Vitals:   10/05/14 1711  BP: 110/70  Pulse: 60  Temp: 97.7 F (36.5 C)  TempSrc: Oral  Resp: 16  Height: 5\' 3"  (1.6 m)  Weight: 207 lb (93.895 kg)  SpO2: 97%   PREVNAR 13 AND INFLUENZA VACCINES ADMINISTERED.  Assessment & Plan:   1. Slow transit constipation   2. Abdominal pain, epigastric   3. Need for prophylactic vaccination and inoculation against influenza   4. Need for prophylactic vaccination against Streptococcus pneumoniae (pneumococcus)   5. Colon cancer screening     1. Slow transit constipation: New.  Recommend increased water intake, exercise.  Recommend Miralax or Colace daily.  2.  Abdominal pain epigastric: New. With associated GERD symptoms; rx for Zantac 150mg  bid provided.  Obtain labs. 3.  Colon cancer screening: Refer to GI for colonoscopy. 4.  S/p Prevnar 13 and influenza vaccines.   Orders Placed This Encounter  Procedures  . Flu Vaccine QUAD 36+ mos IM  . Pneumococcal conjugate vaccine 13-valent IM  . Comprehensive metabolic panel  . Ambulatory referral to Gastroenterology    Referral Priority:  Routine    Referral Type:  Consultation    Referral Reason:  Specialty Services Required    Number of Visits Requested:  1  . POCT CBC  .  POCT urinalysis dipstick    Meds ordered this encounter  Medications  . DISCONTD: ranitidine (ZANTAC) 150 MG tablet    Sig: Take 1 tablet (150 mg total) by mouth 2 (two) times daily.    Dispense:  60 tablet    Refill:  0    By signing my name below, I, Raven Small, attest that this documentation has been prepared under the direction and in the presence of Kathleen Forts, MD.  Electronically Signed: Thea Alken, ED Scribe. 10/05/2014. 12:12 PM.  I personally performed the services described in this documentation, which was scribed in my presence. The recorded information has been reviewed and considered.  Kristi Elayne Guerin, M.D. [facility] [address] [place], [place]  [address] [phone] phone [phone] fax

## 2014-10-06 LAB — COMPREHENSIVE METABOLIC PANEL
ALT: 17 U/L (ref 6–29)
AST: 16 U/L (ref 10–35)
Albumin: 4 g/dL (ref 3.6–5.1)
Alkaline Phosphatase: 81 U/L (ref 33–130)
BILIRUBIN TOTAL: 0.5 mg/dL (ref 0.2–1.2)
BUN: 15 mg/dL (ref 7–25)
CALCIUM: 9.9 mg/dL (ref 8.6–10.4)
CO2: 24 mmol/L (ref 20–31)
Chloride: 102 mmol/L (ref 98–110)
Creat: 0.94 mg/dL — ABNORMAL HIGH (ref 0.60–0.93)
Glucose, Bld: 114 mg/dL — ABNORMAL HIGH (ref 65–99)
POTASSIUM: 3.7 mmol/L (ref 3.5–5.3)
Sodium: 137 mmol/L (ref 135–146)
Total Protein: 6.9 g/dL (ref 6.1–8.1)

## 2014-10-11 DIAGNOSIS — R42 Dizziness and giddiness: Secondary | ICD-10-CM | POA: Diagnosis not present

## 2014-10-11 DIAGNOSIS — R404 Transient alteration of awareness: Secondary | ICD-10-CM | POA: Diagnosis not present

## 2014-10-13 ENCOUNTER — Ambulatory Visit (INDEPENDENT_AMBULATORY_CARE_PROVIDER_SITE_OTHER): Payer: Medicare Other | Admitting: Family Medicine

## 2014-10-13 VITALS — BP 120/72 | HR 68 | Temp 98.1°F | Resp 18 | Ht 63.0 in | Wt 203.8 lb

## 2014-10-13 DIAGNOSIS — J0100 Acute maxillary sinusitis, unspecified: Secondary | ICD-10-CM | POA: Diagnosis not present

## 2014-10-13 MED ORDER — HYDROCODONE-HOMATROPINE 5-1.5 MG/5ML PO SYRP: 5.0000 mL | ORAL_SOLUTION | Freq: Three times a day (TID) | ORAL | Status: DC | PRN

## 2014-10-13 MED ORDER — AMOXICILLIN 875 MG PO TABS: 875.0000 mg | ORAL_TABLET | Freq: Two times a day (BID) | ORAL | Status: DC

## 2014-10-13 NOTE — Progress Notes (Signed)
Subjective:    Patient ID: Kathleen Pacheco, female    DOB: Jun 21, 1935, 79 y.o.   MRN: [id number] This chart was scribed for Robyn Haber, MD by Zola Button, Medical Scribe. This patient was seen in Room 10 and the patient's care was started at 10:57 AM.   HPI HPI Comments: Kathleen Pacheco is a 79 y.o. female who presents to the [facility] complaining of gradual onset cough that started 3 days ago. Patient reports having associated congestion. She also had an episode of dizziness and syncope in the bathroom 2 days ago, though she did not sustain any injuries. She has tried Robitussin and Zyrtec for her symptoms but without relief. Patient denies fever and diaphoresis. She has allergies to penicillin, but she is able to take amoxicillin.  Patient is retired. She used to be a Pharmacist, hospital.  Review of Systems  Constitutional: Negative for fever and diaphoresis.  HENT: Positive for congestion.   Respiratory: Positive for cough.   Neurological: Positive for dizziness and syncope.       Objective:   Physical Exam CONSTITUTIONAL: Well developed/well nourished HEAD: Normocephalic/atraumatic EYES: EOM/PERRL ENMT: Mucous membranes moist, marked swelling of the nasal passages with mucopurulent discharge. Throat is mildly erythematous with thick postnasal drainage. TMs are normal NECK: supple no meningeal signs, no adenopathy SPINE: entire spine nontender CV: S1/S2 noted, no murmurs/rubs/gallops noted LUNGS: Lungs are clear to auscultation bilaterally, no apparent distress ABDOMEN: soft, nontender, no rebound or guarding GU: no cva tenderness NEURO: Pt is awake/alert, moves all extremitiesx4 EXTREMITIES: pulses normal, full ROM SKIN: warm, color normal PSYCH: no abnormalities of mood noted  Results for orders placed or performed in visit on 10/05/14  Comprehensive metabolic panel  Result Value Ref Range   Sodium 137 135 - 146 mmol/L   Potassium 3.7 3.5 - 5.3 mmol/L   Chloride 102 98 - 110 mmol/L   CO2 24 20 - 31 mmol/L   Glucose, Bld 114 (H) 65 - 99 mg/dL   BUN 15 7 - 25 mg/dL   Creat 0.94 (H) 0.60 - 0.93 mg/dL   Total Bilirubin 0.5 0.2 - 1.2 mg/dL   Alkaline Phosphatase 81 33 - 130 U/L   AST 16 10 - 35 U/L   ALT 17 6 - 29 U/L   Total Protein 6.9 6.1 - 8.1 g/dL   Albumin 4.0 3.6 - 5.1 g/dL   Calcium 9.9 8.6 - 10.4 mg/dL  POCT CBC  Result Value Ref Range   WBC 5.3 4.6 - 10.2 K/uL   Lymph, poc 2.9 0.6 - 3.4   POC LYMPH PERCENT 54.4 (A) 10 - 50 %L   MID (cbc) 0.4 0 - 0.9   POC MID % 6.8 0 - 12 %M   POC Granulocyte 2.1 2 - 6.9   Granulocyte percent 38.8 37 - 80 %G   RBC 5.34 4.04 - 5.48 M/uL   Hemoglobin 13.9 12.2 - 16.2 g/dL   HCT, POC 45.0 37.7 - 47.9 %   MCV 84.3 80 - 97 fL   MCH, POC 26.1 (A) 27 - 31.2 pg   MCHC 31.0 (A) 31.8 - 35.4 g/dL   RDW, POC 15.8 %   Platelet Count, POC 185 142 - 424 K/uL   MPV 8.8 0 - 99.8 fL  POCT urinalysis dipstick  Result Value Ref Range   Color, UA yellow yellow   Clarity, UA clear clear   Glucose, UA negative negative   Bilirubin, UA negative negative  Ketones, POC UA negative negative   Spec Grav, UA 1.025    Blood, UA trace-intact (A) negative   pH, UA 6.0    Protein Ur, POC negative negative   Urobilinogen, UA 0.2    Nitrite, UA Negative Negative   Leukocytes, UA small (1+) (A) Negative         Assessment & Plan:   By signing my name below, I, Zola Button, attest that this documentation has been prepared under the direction and in the presence of Robyn Haber, MD.  Electronically Signed: Zola Button, Medical Scribe. 10/13/2014. 10:57 AM.   This chart was scribed in my presence and reviewed by me personally.    ICD-9-CM ICD-10-CM   1. Acute maxillary sinusitis, recurrence not specified 461.0 J01.00 amoxicillin (AMOXIL) 875 MG tablet     HYDROcodone-homatropine (HYCODAN) 5-1.5 MG/5ML syrup     Signed, Robyn Haber, MD

## 2014-10-13 NOTE — Patient Instructions (Addendum)
Sinusitis Sinusitis is redness, soreness, and inflammation of the paranasal sinuses. Paranasal sinuses are air pockets within the bones of your face (beneath the eyes, the middle of the forehead, or above the eyes). In healthy paranasal sinuses, mucus is able to drain out, and air is able to circulate through them by way of your nose. However, when your paranasal sinuses are inflamed, mucus and air can become trapped. This can allow bacteria and other germs to grow and cause infection. Sinusitis can develop quickly and last only a short time (acute) or continue over a long period (chronic). Sinusitis that lasts for more than 12 weeks is considered chronic.  CAUSES  Causes of sinusitis include:  Allergies.  Structural abnormalities, such as displacement of the cartilage that separates your nostrils (deviated septum), which can decrease the air flow through your nose and sinuses and affect sinus drainage.  Functional abnormalities, such as when the small hairs (cilia) that line your sinuses and help remove mucus do not work properly or are not present. SIGNS AND SYMPTOMS  Symptoms of acute and chronic sinusitis are the same. The primary symptoms are pain and pressure around the affected sinuses. Other symptoms include:  Upper toothache.  Earache.  Headache.  Bad breath.  Decreased sense of smell and taste.  A cough, which worsens when you are lying flat.  Fatigue.  Fever.  Thick drainage from your nose, which often is green and may contain pus (purulent).  Swelling and warmth over the affected sinuses. DIAGNOSIS  Your health care provider will perform a physical exam. During the exam, your health care provider may:  Look in your nose for signs of abnormal growths in your nostrils (nasal polyps).  Tap over the affected sinus to check for signs of infection.  View the inside of your sinuses (endoscopy) using an imaging device that has a light attached (endoscope). If your health  care provider suspects that you have chronic sinusitis, one or more of the following tests may be recommended:  Allergy tests.  Nasal culture. A sample of mucus is taken from your nose, sent to a lab, and screened for bacteria.  Nasal cytology. A sample of mucus is taken from your nose and examined by your health care provider to determine if your sinusitis is related to an allergy. TREATMENT  Most cases of acute sinusitis are related to a viral infection and will resolve on their own within 10 days. Sometimes medicines are prescribed to help relieve symptoms (pain medicine, decongestants, nasal steroid sprays, or saline sprays).  However, for sinusitis related to a bacterial infection, your health care provider will prescribe antibiotic medicines. These are medicines that will help kill the bacteria causing the infection.  Rarely, sinusitis is caused by a fungal infection. In theses cases, your health care provider will prescribe antifungal medicine. For some cases of chronic sinusitis, surgery is needed. Generally, these are cases in which sinusitis recurs more than 3 times per year, despite other treatments. HOME CARE INSTRUCTIONS   Drink plenty of water. Water helps thin the mucus so your sinuses can drain more easily.  Use a humidifier.  Inhale steam 3 to 4 times a day (for example, sit in the bathroom with the shower running).  Apply a warm, moist washcloth to your face 3 to 4 times a day, or as directed by your health care provider.  Use saline nasal sprays to help moisten and clean your sinuses.  Take medicines only as directed by your health care provider.    If you were prescribed either an antibiotic or antifungal medicine, finish it all even if you start to feel better. SEEK IMMEDIATE MEDICAL CARE IF:  You have increasing pain or severe headaches.  You have nausea, vomiting, or drowsiness.  You have swelling around your face.  You have vision problems.  You have a stiff  neck.  You have difficulty breathing. MAKE SURE YOU:   Understand these instructions.  Will watch your condition.  Will get help right away if you are not doing well or get worse. Document Released: 12/31/2004 Document Revised: 05/17/2013 Document Reviewed: 01/15/2011 [facility] Patient Information 2015 [place], [place]. This information is not intended to replace advice given to you by your health care provider. Make sure you discuss any questions you have with your health care provider.   Results for orders placed or performed in visit on 10/05/14  Comprehensive metabolic panel  Result Value Ref Range   Sodium 137 135 - 146 mmol/L   Potassium 3.7 3.5 - 5.3 mmol/L   Chloride 102 98 - 110 mmol/L   CO2 24 20 - 31 mmol/L   Glucose, Bld 114 (H) 65 - 99 mg/dL   BUN 15 7 - 25 mg/dL   Creat 0.94 (H) 0.60 - 0.93 mg/dL   Total Bilirubin 0.5 0.2 - 1.2 mg/dL   Alkaline Phosphatase 81 33 - 130 U/L   AST 16 10 - 35 U/L   ALT 17 6 - 29 U/L   Total Protein 6.9 6.1 - 8.1 g/dL   Albumin 4.0 3.6 - 5.1 g/dL   Calcium 9.9 8.6 - 10.4 mg/dL  POCT CBC  Result Value Ref Range   WBC 5.3 4.6 - 10.2 K/uL   Lymph, poc 2.9 0.6 - 3.4   POC LYMPH PERCENT 54.4 (A) 10 - 50 %L   MID (cbc) 0.4 0 - 0.9   POC MID % 6.8 0 - 12 %M   POC Granulocyte 2.1 2 - 6.9   Granulocyte percent 38.8 37 - 80 %G   RBC 5.34 4.04 - 5.48 M/uL   Hemoglobin 13.9 12.2 - 16.2 g/dL   HCT, POC 45.0 37.7 - 47.9 %   MCV 84.3 80 - 97 fL   MCH, POC 26.1 (A) 27 - 31.2 pg   MCHC 31.0 (A) 31.8 - 35.4 g/dL   RDW, POC 15.8 %   Platelet Count, POC 185 142 - 424 K/uL   MPV 8.8 0 - 99.8 fL  POCT urinalysis dipstick  Result Value Ref Range   Color, UA yellow yellow   Clarity, UA clear clear   Glucose, UA negative negative   Bilirubin, UA negative negative   Ketones, POC UA negative negative   Spec Grav, UA 1.025    Blood, UA trace-intact (A) negative   pH, UA 6.0    Protein Ur, POC negative negative   Urobilinogen, UA 0.2     Nitrite, UA Negative Negative   Leukocytes, UA small (1+) (A) Negative

## 2014-10-15 ENCOUNTER — Telehealth: Payer: Self-pay

## 2014-10-15 NOTE — Telephone Encounter (Signed)
LMOM about labs and to CB with status

## 2014-10-16 ENCOUNTER — Other Ambulatory Visit: Payer: Self-pay | Admitting: Family Medicine

## 2014-10-16 ENCOUNTER — Ambulatory Visit (INDEPENDENT_AMBULATORY_CARE_PROVIDER_SITE_OTHER): Payer: Medicare Other | Admitting: Family Medicine

## 2014-10-16 ENCOUNTER — Ambulatory Visit (INDEPENDENT_AMBULATORY_CARE_PROVIDER_SITE_OTHER): Payer: Medicare Other

## 2014-10-16 VITALS — BP 120/70 | HR 63 | Temp 98.0°F | Resp 18 | Ht 63.0 in | Wt 204.0 lb

## 2014-10-16 DIAGNOSIS — R945 Abnormal results of liver function studies: Secondary | ICD-10-CM | POA: Diagnosis not present

## 2014-10-16 DIAGNOSIS — J9801 Acute bronchospasm: Secondary | ICD-10-CM

## 2014-10-16 DIAGNOSIS — K5901 Slow transit constipation: Secondary | ICD-10-CM

## 2014-10-16 DIAGNOSIS — R55 Syncope and collapse: Secondary | ICD-10-CM

## 2014-10-16 DIAGNOSIS — R062 Wheezing: Secondary | ICD-10-CM | POA: Diagnosis not present

## 2014-10-16 DIAGNOSIS — J0100 Acute maxillary sinusitis, unspecified: Secondary | ICD-10-CM

## 2014-10-16 LAB — POCT CBC
GRANULOCYTE PERCENT: 40.9 % (ref 37–80)
HEMATOCRIT: 45.1 % (ref 37.7–47.9)
HEMOGLOBIN: 13.8 g/dL (ref 12.2–16.2)
LYMPH, POC: 2.4 (ref 0.6–3.4)
MCH, POC: 26 pg — AB (ref 27–31.2)
MCHC: 30.6 g/dL — AB (ref 31.8–35.4)
MCV: 84.7 fL (ref 80–97)
MID (cbc): 1.8 — AB (ref 0–0.9)
MPV: 8.3 fL (ref 0–99.8)
PLATELET COUNT, POC: 215 10*3/uL (ref 142–424)
POC Granulocyte: 5.33 (ref 2–6.9)
POC LYMPH PERCENT: 52.8 %L — AB (ref 10–50)
POC MID %: 6.3 %M (ref 0–12)
RBC: 5.33 M/uL (ref 4.04–5.48)
RDW, POC: 15.6 %
WBC: 4.5 10*3/uL — AB (ref 4.6–10.2)

## 2014-10-16 MED ORDER — MUCINEX DM 30-600 MG PO TB12: 1.0000 | ORAL_TABLET | Freq: Two times a day (BID) | ORAL | Status: DC

## 2014-10-16 MED ORDER — ALBUTEROL SULFATE (2.5 MG/3ML) 0.083% IN NEBU
2.5000 mg | INHALATION_SOLUTION | Freq: Once | RESPIRATORY_TRACT | Status: AC
  Administered 2014-10-16: 2.5 mg via RESPIRATORY_TRACT

## 2014-10-16 MED ORDER — HYDROCODONE-HOMATROPINE 5-1.5 MG/5ML PO SYRP: 5.0000 mL | ORAL_SOLUTION | Freq: Three times a day (TID) | ORAL | Status: DC | PRN

## 2014-10-16 MED ORDER — PREDNISONE 20 MG PO TABS: ORAL_TABLET | ORAL | Status: DC

## 2014-10-16 MED ORDER — ALBUTEROL SULFATE HFA 108 (90 BASE) MCG/ACT IN AERS: 2.0000 | INHALATION_SPRAY | Freq: Four times a day (QID) | RESPIRATORY_TRACT | Status: DC | PRN

## 2014-10-16 NOTE — Progress Notes (Signed)
Subjective:    Patient ID: Kathleen Pacheco, female    DOB: [birth year], 79 y.o.   MRN: [id number]  10/16/2014  Follow-up   HPI This 79 y.o. female presents for persistent cough.  Presented with head congestion and choking; diagnosed with sinusitis; prescribed Amoxicillin and cough medication without improvement. Using humidifier which is helpful at night.  Today, in bed due to horrible choking cough.  No fever but +chills.  HA one day.  No ear pain or sore throat.  +rhinorrhea; white drainage and moderately thick.  Using nasal spray.  Cough is horrible; cannot breath with coughing.  Wheezing.  No SOB.  No v/d.    S/p influenza vaccine and Prevnar 13 on 10/05/2014.  Suffered with syncopal event the following day.  Early in morning around 10:00am; getting hair done; sitting on toilet; had bowel movement.  No recurrent bowel movement until today.  Denies associated CP/palp/SOB/leg swelling/diaphoresis. No witnessed seizure activity.  No bowel or bladder incontinence.  No confusion after event.  Constipation: had b.m. Today; taking stool softener and medication prescribed.  Review of Systems  Constitutional: Positive for chills. Negative for fever, diaphoresis and fatigue.  HENT: Positive for congestion, postnasal drip and rhinorrhea. Negative for ear pain and sore throat.   Eyes: Negative for visual disturbance.  Respiratory: Positive for cough, shortness of breath and wheezing.   Cardiovascular: Negative for chest pain, palpitations and leg swelling.  Gastrointestinal: Positive for constipation. Negative for nausea, vomiting, abdominal pain and diarrhea.  Endocrine: Negative for cold intolerance, heat intolerance, polydipsia, polyphagia and polyuria.  Genitourinary: Negative for dysuria, urgency, frequency and pelvic pain.  Neurological: Positive for syncope and headaches. Negative for dizziness, tremors, seizures, facial asymmetry, speech difficulty, weakness, light-headedness and numbness.    Psychiatric/Behavioral: Negative for confusion.    Past Medical History  Diagnosis Date  . Thyroid disease   . Migraine   . Hypertension   . Hx of bladder infections   . Vertigo   . Bradycardia   . Hyperlipidemia   . Gout    Past Surgical History  Procedure Laterality Date  . Abdominal hysterectomy    . Parathyroidectomy    . Gallbladder surgery    . Cholecystectomy     Allergies  Allergen Reactions  . Ace Inhibitors   . Other     Artificial sweeteners  . Penicillins     Causes yeast infection    Social History   Social History  . Marital Status: Divorced    Spouse Name: N/A  . Number of Children: N/A  . Years of Education: N/A   Occupational History  . Not on file.   Social History Main Topics  . Smoking status: Never Smoker   . Smokeless tobacco: Never Used  . Alcohol Use: No  . Drug Use: No  . Sexual Activity: No     Comment: HYST    Other Topics Concern  . Not on file   Social History Narrative   No family history on file.     Objective:    BP 120/70 mmHg  Pulse 63  Temp(Src) 98 F (36.7 C) (Oral)  Resp 18  Ht 5\' 3"  (1.6 m)  Wt 204 lb (92.534 kg)  BMI 36.15 kg/m2  SpO2 98% Physical Exam  Constitutional: She is oriented to person, place, and time. She appears well-developed and well-nourished. No distress.  HENT:  Head: Normocephalic and atraumatic.  Right Ear: External ear normal.  Left Ear: External ear normal.  Nose: Nose  normal.  Mouth/Throat: Oropharynx is clear and moist.  Eyes: Conjunctivae and EOM are normal. Pupils are equal, round, and reactive to light.  Neck: Normal range of motion. Neck supple. Carotid bruit is not present. No thyromegaly present.  Cardiovascular: Normal rate, regular rhythm, normal heart sounds and intact distal pulses.  Exam reveals no gallop and no friction rub.   No murmur heard. Pulmonary/Chest: Effort normal. No tachypnea. No respiratory distress. She has no decreased breath sounds. She has  wheezes in the right upper field, the right middle field, the right lower field, the left upper field, the left middle field and the left lower field. She has no rhonchi. She has no rales. She exhibits no tenderness.  Diffuse inspiratory and expiratory wheezing throughout; speaking complete sentences.  Abdominal: Soft. Bowel sounds are normal. She exhibits no distension and no mass. There is no tenderness. There is no rebound and no guarding.  Lymphadenopathy:    She has no cervical adenopathy.  Neurological: She is alert and oriented to person, place, and time. No cranial nerve deficit.  Skin: Skin is warm and dry. No rash noted. She is not diaphoretic. No erythema. No pallor.  Psychiatric: She has a normal mood and affect. Her behavior is normal.    [facility] reading (PRIMARY) by  Dr. Tamala Julian. CXR: NAD.  EKG: NSR; T wave inversion V1-V6.  ALBUTEROL NEBULIZER ADMINISTERED.    Assessment & Plan:   1. Bronchospasm   2. Syncope and collapse   3. Acute maxillary sinusitis, recurrence not specified   4. Slow transit constipation     1. Acute maxillary sinusitis: stable; complete Amoxicillin as prescribed.  Rx for Hycodon and Mucinex DM provided. 2. Bronchospasm: New. S/p Albuterol nebulizer in office; rx for Prednisone and Albuterol HFA provided to use qid scheduled and then PRN.   3.  Syncope: New.  Consistent with vasovagal or post-micturition syncope; stable EKG; obtain labs.  To ED if recurs. 4. Constipation: mildly improved.  Normal b.m. Today.   Orders Placed This Encounter  Procedures  . DG Chest 2 View    Standing Status: Future     Number of Occurrences: 1     Standing Expiration Date: 10/16/2015    Order Specific Question:  Reason for Exam (SYMPTOM  OR DIAGNOSIS REQUIRED)    Answer:  cough, wheezing    Order Specific Question:  Preferred imaging location?    Answer:  External  . Comprehensive metabolic panel  . POCT CBC  . EKG 12-Lead   Meds ordered this encounter   Medications  . albuterol (PROVENTIL) (2.5 MG/3ML) 0.083% nebulizer solution 2.5 mg    Sig:   . DISCONTD: predniSONE (DELTASONE) 20 MG tablet    Sig: Three tablets daily x 2 days then two tablets daily x 5 days then one tablet daily x 5 days    Dispense:  21 tablet    Refill:  0  . HYDROcodone-homatropine (HYCODAN) 5-1.5 MG/5ML syrup    Sig: Take 5 mLs by mouth every 8 (eight) hours as needed for cough.    Dispense:  120 mL    Refill:  0  . DISCONTD: Dextromethorphan-Guaifenesin (MUCINEX DM) 30-600 MG TB12    Sig: Take 1 tablet by mouth 2 (two) times daily.    Dispense:  28 each    Refill:  0  . albuterol (PROVENTIL HFA;VENTOLIN HFA) 108 (90 BASE) MCG/ACT inhaler    Sig: Inhale 2 puffs into the lungs every 6 (six) hours as needed for wheezing or  shortness of breath (cough, shortness of breath or wheezing.).    Dispense:  1 Inhaler    Refill:  0    Return if symptoms worsen or fail to improve.    Kristi Elayne Guerin, M.D. [facility] [address] [place], [place]  [address] [phone] phone [phone] fax

## 2014-10-17 LAB — COMPREHENSIVE METABOLIC PANEL
ALK PHOS: 92 U/L (ref 33–130)
ALT: 68 U/L — AB (ref 6–29)
AST: 84 U/L — ABNORMAL HIGH (ref 10–35)
Albumin: 4 g/dL (ref 3.6–5.1)
BUN: 7 mg/dL (ref 7–25)
CO2: 30 mmol/L (ref 20–31)
CREATININE: 0.91 mg/dL (ref 0.60–0.93)
Calcium: 9.7 mg/dL (ref 8.6–10.4)
Chloride: 100 mmol/L (ref 98–110)
Glucose, Bld: 108 mg/dL — ABNORMAL HIGH (ref 65–99)
Potassium: 3.6 mmol/L (ref 3.5–5.3)
SODIUM: 135 mmol/L (ref 135–146)
TOTAL PROTEIN: 7.1 g/dL (ref 6.1–8.1)
Total Bilirubin: 0.6 mg/dL (ref 0.2–1.2)

## 2014-10-17 NOTE — Telephone Encounter (Signed)
Pt came in to see Dr. Tamala Julian.

## 2014-10-19 ENCOUNTER — Ambulatory Visit (INDEPENDENT_AMBULATORY_CARE_PROVIDER_SITE_OTHER): Payer: Medicare Other

## 2014-10-19 ENCOUNTER — Ambulatory Visit (INDEPENDENT_AMBULATORY_CARE_PROVIDER_SITE_OTHER): Payer: Medicare Other | Admitting: Emergency Medicine

## 2014-10-19 VITALS — BP 138/88 | HR 57 | Temp 97.7°F | Resp 18 | Ht 63.0 in | Wt 201.0 lb

## 2014-10-19 DIAGNOSIS — R1010 Upper abdominal pain, unspecified: Secondary | ICD-10-CM

## 2014-10-19 DIAGNOSIS — R0602 Shortness of breath: Secondary | ICD-10-CM | POA: Diagnosis not present

## 2014-10-19 DIAGNOSIS — I517 Cardiomegaly: Secondary | ICD-10-CM

## 2014-10-19 DIAGNOSIS — J9801 Acute bronchospasm: Secondary | ICD-10-CM

## 2014-10-19 DIAGNOSIS — R945 Abnormal results of liver function studies: Secondary | ICD-10-CM

## 2014-10-19 DIAGNOSIS — R7989 Other specified abnormal findings of blood chemistry: Secondary | ICD-10-CM

## 2014-10-19 DIAGNOSIS — I10 Essential (primary) hypertension: Secondary | ICD-10-CM

## 2014-10-19 DIAGNOSIS — R799 Abnormal finding of blood chemistry, unspecified: Secondary | ICD-10-CM | POA: Diagnosis not present

## 2014-10-19 LAB — POCT CBC
GRANULOCYTE PERCENT: 71.3 % (ref 37–80)
HEMATOCRIT: 48.7 % — AB (ref 37.7–47.9)
Hemoglobin: 14.7 g/dL (ref 12.2–16.2)
Lymph, poc: 3.2 (ref 0.6–3.4)
MCH, POC: 25.2 pg — AB (ref 27–31.2)
MCHC: 30.3 g/dL — AB (ref 31.8–35.4)
MCV: 83.2 fL (ref 80–97)
MID (CBC): 0.6 (ref 0–0.9)
MPV: 9 fL (ref 0–99.8)
POC Granulocyte: 9.3 — AB (ref 2–6.9)
POC LYMPH %: 24.1 % (ref 10–50)
POC MID %: 4.6 % (ref 0–12)
Platelet Count, POC: 307 10*3/uL (ref 142–424)
RBC: 5.85 M/uL — AB (ref 4.04–5.48)
RDW, POC: 16 %
WBC: 13.1 10*3/uL — AB (ref 4.6–10.2)

## 2014-10-19 LAB — HEPATIC FUNCTION PANEL
ALBUMIN: 4.4 g/dL (ref 3.6–5.1)
ALK PHOS: 92 U/L (ref 33–130)
ALT: 46 U/L — ABNORMAL HIGH (ref 6–29)
AST: 24 U/L (ref 10–35)
BILIRUBIN INDIRECT: 0.5 mg/dL (ref 0.2–1.2)
Bilirubin, Direct: 0.1 mg/dL (ref <=0.2)
TOTAL PROTEIN: 7.5 g/dL (ref 6.1–8.1)
Total Bilirubin: 0.6 mg/dL (ref 0.2–1.2)

## 2014-10-19 LAB — HEPATITIS PANEL, ACUTE
HCV Ab: NEGATIVE
HEP A IGM: NONREACTIVE
HEP B C IGM: NONREACTIVE
Hepatitis B Surface Ag: NEGATIVE

## 2014-10-19 LAB — CK: Total CK: 127 U/L (ref 7–177)

## 2014-10-19 MED ORDER — ALBUTEROL SULFATE HFA 108 (90 BASE) MCG/ACT IN AERS: 2.0000 | INHALATION_SPRAY | RESPIRATORY_TRACT | Status: DC | PRN

## 2014-10-19 MED ORDER — FLUTICASONE PROPIONATE 50 MCG/ACT NA SUSP: 2.0000 | Freq: Every day | NASAL | Status: DC

## 2014-10-19 MED ORDER — ALBUTEROL SULFATE (2.5 MG/3ML) 0.083% IN NEBU
2.5000 mg | INHALATION_SOLUTION | Freq: Once | RESPIRATORY_TRACT | Status: AC
  Administered 2014-10-19: 2.5 mg via RESPIRATORY_TRACT

## 2014-10-19 MED ORDER — AZITHROMYCIN 250 MG PO TABS: ORAL_TABLET | ORAL | Status: DC

## 2014-10-19 NOTE — Progress Notes (Addendum)
This chart was scribed for Kathleen Queen, MD by Moises Blood, Medical Scribe. This patient was seen in Room 12 and the patient's care was started 3:40 PM.  Chief Complaint:  Chief Complaint  Patient presents with  . Cough    Non productive  . Explain lab work    Liver enzymes    HPI: Kathleen Pacheco is a 79 y.o. female who reports to [facility] today complaining of chest congestion with a cough.  She saw Dr. Tamala Julian 3 days ago with these symptoms. Since that visit, she feels her congestion has moved down to her chest. She was given an inhaler but she denies using it. Every time she coughs, it makes go to the bathroom for BM.   She wants her liver enzymes lab work checked. She denies complications with her liver. She came in a month ago with abd distention. She's had a cholecystectomy and a hysterectomy.   She had chest Xray done on 10/16/14 with mild cardiomegaly without acute disease.   Past Medical History  Diagnosis Date  . Thyroid disease   . Migraine   . Hypertension   . Hx of bladder infections   . Vertigo   . Bradycardia   . Hyperlipidemia   . Gout    Past Surgical History  Procedure Laterality Date  . Abdominal hysterectomy    . Parathyroidectomy    . Gallbladder surgery    . Cholecystectomy     Social History   Social History  . Marital Status: Divorced    Spouse Name: N/A  . Number of Children: N/A  . Years of Education: N/A   Social History Main Topics  . Smoking status: Never Smoker   . Smokeless tobacco: Never Used  . Alcohol Use: No  . Drug Use: No  . Sexual Activity: No     Comment: HYST    Other Topics Concern  . None   Social History Narrative   No family history on file. Allergies  Allergen Reactions  . Ace Inhibitors   . Other     Artificial sweeteners  . Penicillins     Causes yeast infection   Prior to Admission medications   Medication Sig Start Date End Date Taking? Authorizing Provider  albuterol (PROVENTIL HFA;VENTOLIN HFA)  108 (90 BASE) MCG/ACT inhaler Inhale 2 puffs into the lungs every 6 (six) hours as needed for wheezing or shortness of breath (cough, shortness of breath or wheezing.). 10/16/14  Yes Wardell Honour, MD  amLODipine (NORVASC) 10 MG tablet Take 10 mg by mouth daily.   Yes Historical Provider, MD  amoxicillin (AMOXIL) 875 MG tablet Take 1 tablet (875 mg total) by mouth 2 (two) times daily. 10/13/14  Yes Robyn Haber, MD  aspirin EC 81 MG tablet Take 81 mg by mouth daily.   Yes Historical Provider, MD  Colchicine 0.6 MG CAPS Twice a day til foot seems well then once a day for 3 more days 09/03/14  Yes [name], MD  Dextromethorphan-Guaifenesin [facility] DM) 30-600 MG TB12 Take 1 tablet by mouth 2 (two) times daily. 10/16/14  Yes Wardell Honour, MD  hydrochlorothiazide (HYDRODIURIL) 25 MG tablet Take 25 mg by mouth daily.   Yes Historical Provider, MD  HYDROcodone-homatropine (HYCODAN) 5-1.5 MG/5ML syrup Take 5 mLs by mouth every 8 (eight) hours as needed for cough. 10/16/14  Yes Wardell Honour, MD  KLOR-CON M20 20 MEQ tablet Take 20 mEq by mouth daily.  05/03/13  Yes Historical  Provider, MD  meloxicam (MOBIC) 15 MG tablet Take 15 mg by mouth daily.  06/28/13  Yes Historical Provider, MD  predniSONE (DELTASONE) 20 MG tablet Three tablets daily x 2 days then two tablets daily x 5 days then one tablet daily x 5 days 10/16/14  Yes Wardell Honour, MD  ranitidine (ZANTAC) 150 MG tablet Take 1 tablet (150 mg total) by mouth 2 (two) times daily. 10/05/14  Yes Wardell Honour, MD  rosuvastatin (CRESTOR) 10 MG tablet Take 10 mg by mouth daily.   Yes Historical Provider, MD  solifenacin (VESICARE) 5 MG tablet Take 1 tablet (5 mg total) by mouth daily. 12/02/13  Yes [name], MD     ROS:  Constitutional: negative for chills, fever, night sweats, weight changes, or fatigue  HEENT: negative for vision changes, hearing loss, congestion, rhinorrhea, ST, epistaxis, or sinus pressure Cardiovascular: negative  for chest pain or palpitations Respiratory: negative for hemoptysis, shortness of breath; positive for cough, wheezing Abdominal: negative for abdominal pain, nausea, vomiting, diarrhea, or constipation Dermatological: negative for rash Neurologic: negative for headache, dizziness, or syncope All other systems reviewed and are otherwise negative with the exception to those above and in the HPI.  PHYSICAL EXAM: Filed Vitals:   10/19/14 1458  BP: 138/88  Pulse: 57  Temp: 97.7 F (36.5 C)  Resp: 18   Body mass index is 35.61 kg/(m^2).   General: Alert, no acute distress HEENT:  Normocephalic, atraumatic, oropharynx patent. Eye: [name] [facility] Cardiovascular:  Regular rate and rhythm, no rubs murmurs or gallops.  No Carotid bruits, radial pulse intact. No pedal edema.  Respiratory: No rales, or rhonchi.  No cyanosis, no use of accessory musculature; inhale and expiratory wheezes bilaterally Abdominal: No organomegaly, abdomen is soft and non-tender, positive bowel sounds. No masses. Musculoskeletal: Gait intact. No edema, tenderness Skin: No rashes. Neurologic: Facial musculature symmetric. Psychiatric: Patient acts appropriately throughout our interaction.  Lymphatic: No cervical or submandibular lymphadenopathy Genitourinary/Anorectal: No acute findings   LABS: Results for orders placed or performed in visit on 10/19/14  POCT CBC  Result Value Ref Range   WBC 13.1 (A) 4.6 - 10.2 K/uL   Lymph, poc 3.2 0.6 - 3.4   POC LYMPH PERCENT 24.1 10 - 50 %L   MID (cbc) 0.6 0 - 0.9   POC MID % 4.6 0 - 12 %M   POC Granulocyte 9.3 (A) 2 - 6.9   Granulocyte percent 71.3 37 - 80 %G   RBC 5.85 (A) 4.04 - 5.48 M/uL   Hemoglobin 14.7 12.2 - 16.2 g/dL   HCT, POC 48.7 (A) 37.7 - 47.9 %   MCV 83.2 80 - 97 fL   MCH, POC 25.2 (A) 27 - 31.2 pg   MCHC 30.3 (A) 31.8 - 35.4 g/dL   RDW, POC 16.0 %   Platelet Count, POC 307 142 - 424 K/uL   MPV 9.0 0 - 99.8 fL     EKG/XRAY:   Primary read  interpreted by Dr. Everlene Farrier at [facility]. Patient has cardiomegaly but no pneumonic infiltrates.   ASSESSMENT/PLAN: Peak flow 140 prior to nebulizer treatment following nebulizer treatment it was 150. Chest x-ray does not show any acute infiltrate. LFTs were repeated today I did add an Ace level and BNP. She does not have hilar adenopathy. She will finish out her prednisone dosage and start using her albuterol inhaler 2 puffs every 4 hours. We'll recheck Friday morning. I changed her from amoxicillin to Zithromax to cover atypicals  she was also given Flonase for nasal congestion.. I had her walk a loop around the office and she did well and did not desaturate below 97%.  By signing my name below, I, Moises Blood, attest that this documentation has been prepared under the direction and in the presence of Kathleen Queen, MD. Electronically Signed: Moises Blood, [place]. 10/19/2014 , 3:40 PM .    Gross sideeffects, risk and benefits, and alternatives of medications d/w patient. Patient is aware that all medications have potential sideeffects and we are unable to predict every sideeffect or drug-drug interaction that may occur.  Kathleen Queen MD 10/19/2014 3:40 PM  significantly stiff and

## 2014-10-19 NOTE — Patient Instructions (Signed)
Please use your inhaler every 4-6 hours. Please drink good quantities of water. Finish her prednisone. Stop your amoxicillin. Take your Zithromax. See me Friday morning for recheck. Keep you start to have breathing difficulty or feels short of breath or unable to catch your breath please call 911.

## 2014-10-20 LAB — ANGIOTENSIN CONVERTING ENZYME: Angiotensin-Converting Enzyme: 21 U/L (ref 8–52)

## 2014-10-21 ENCOUNTER — Encounter: Payer: Self-pay | Admitting: Emergency Medicine

## 2014-10-21 ENCOUNTER — Other Ambulatory Visit: Payer: Self-pay

## 2014-10-21 ENCOUNTER — Ambulatory Visit (INDEPENDENT_AMBULATORY_CARE_PROVIDER_SITE_OTHER): Payer: Medicare Other | Admitting: Emergency Medicine

## 2014-10-21 VITALS — BP 116/62 | HR 58 | Temp 97.0°F | Resp 16 | Ht 63.0 in | Wt 202.8 lb

## 2014-10-21 DIAGNOSIS — R062 Wheezing: Secondary | ICD-10-CM

## 2014-10-21 MED ORDER — ALBUTEROL SULFATE (2.5 MG/3ML) 0.083% IN NEBU
2.5000 mg | INHALATION_SOLUTION | Freq: Once | RESPIRATORY_TRACT | Status: AC
  Administered 2014-10-21: 2.5 mg via RESPIRATORY_TRACT

## 2014-10-21 MED ORDER — PREDNISONE 20 MG PO TABS: ORAL_TABLET | ORAL | Status: DC

## 2014-10-21 NOTE — Patient Instructions (Signed)
Please use your inhaler 2 puffs every 4 hours. Continue to drink fluids. Take. Taper dose of prednisone. Finish your Zithromax. Recheck one week. Her past

## 2014-10-21 NOTE — Progress Notes (Signed)
Patient ID: Kathleen Pacheco, female   DOB: Sep 24, 1935, 79 y.o.   MRN: [id number]     This chart was scribed for Kathleen Queen, MD by Zola Button, Medical Scribe. This patient was seen in room 13 and the patient's care was started at 8:39 AM.   Chief Complaint:  Chief Complaint  Patient presents with  . Follow-up    bronchospasm    HPI: Kathleen Pacheco is a 79 y.o. female who reports to [facility] today for a follow-up for bronchospasm. She does feel improvement in her symptoms since she was last seen here 2 days ago. She has still been coughing, but this has improved. Her head has been clearing up too. Patient has been using her inhaler which has been providing relief. She last used her inhaler around 6:00 AM this morning. She was initially seen by Dr. Joseph Pacheco on 9/29 and was put on amoxicillin and Hycodan. She then saw Dr. Tamala Pacheco on 10/2 and was started on albuterol and prednisone; however, she did not start taking the inhaler until she saw me 2 days ago on 10/5.  Past Medical History  Diagnosis Date  . Thyroid disease   . Migraine   . Hypertension   . Hx of bladder infections   . Vertigo   . Bradycardia   . Hyperlipidemia   . Gout    Past Surgical History  Procedure Laterality Date  . Abdominal hysterectomy    . Parathyroidectomy    . Gallbladder surgery    . Cholecystectomy     Social History   Social History  . Marital Status: Divorced    Spouse Name: N/A  . Number of Children: N/A  . Years of Education: N/A   Social History Main Topics  . Smoking status: Never Smoker   . Smokeless tobacco: Never Used  . Alcohol Use: No  . Drug Use: No  . Sexual Activity: No     Comment: HYST    Other Topics Concern  . None   Social History Narrative   History reviewed. No pertinent family history. Allergies  Allergen Reactions  . Ace Inhibitors   . Other     Artificial sweeteners  . Penicillins     Causes yeast infection   Prior to Admission medications   Medication Sig  Start Date End Date Taking? Authorizing Provider  albuterol (PROVENTIL HFA;VENTOLIN HFA) 108 (90 BASE) MCG/ACT inhaler Inhale 2 puffs into the lungs every 6 (six) hours as needed for wheezing or shortness of breath (cough, shortness of breath or wheezing.). 10/16/14  Yes Kathleen Honour, MD  albuterol (PROVENTIL HFA;VENTOLIN HFA) 108 (90 BASE) MCG/ACT inhaler Inhale 2 puffs into the lungs every 4 (four) hours as needed for wheezing or shortness of breath (cough, shortness of breath or wheezing.). 10/19/14  Yes Kathleen Russian, MD  amLODipine (NORVASC) 10 MG tablet Take 10 mg by mouth daily.   Yes Historical Provider, MD  aspirin EC 81 MG tablet Take 81 mg by mouth daily.   Yes Historical Provider, MD  azithromycin (ZITHROMAX) 250 MG tablet Take 2 tabs PO x 1 dose, then 1 tab PO QD x 4 days 10/19/14  Yes Kathleen Russian, MD  Dextromethorphan-Guaifenesin [facility] DM) 30-600 MG TB12 Take 1 tablet by mouth 2 (two) times daily. 10/16/14  Yes Kathleen Honour, MD  fluticasone (FLONASE) 50 MCG/ACT nasal spray Place 2 sprays into both nostrils daily. 10/19/14  Yes Kathleen Russian, MD  hydrochlorothiazide (HYDRODIURIL) 25 MG tablet Take 25  mg by mouth daily.   Yes Historical Provider, MD  HYDROcodone-homatropine (HYCODAN) 5-1.5 MG/5ML syrup Take 5 mLs by mouth every 8 (eight) hours as needed for cough. 10/16/14  Yes Kathleen Honour, MD  KLOR-CON M20 20 MEQ tablet Take 20 mEq by mouth daily.  05/03/13  Yes Historical Provider, MD  meloxicam (MOBIC) 15 MG tablet Take 15 mg by mouth daily.  06/28/13  Yes Historical Provider, MD  predniSONE (DELTASONE) 20 MG tablet Three tablets daily x 2 days then two tablets daily x 5 days then one tablet daily x 5 days 10/16/14  Yes Kathleen Honour, MD  rosuvastatin (CRESTOR) 10 MG tablet Take 10 mg by mouth daily.   Yes Historical Provider, MD  amoxicillin (AMOXIL) 875 MG tablet Take 1 tablet (875 mg total) by mouth 2 (two) times daily. Patient not taking: Reported on 10/21/2014 10/13/14   Kathleen Haber, MD  Colchicine 0.6 MG CAPS Twice a day til foot seems well then once a day for 3 more days Patient not taking: Reported on 10/21/2014 09/03/14   [name], MD  ranitidine (ZANTAC) 150 MG tablet Take 1 tablet (150 mg total) by mouth 2 (two) times daily. Patient not taking: Reported on 10/21/2014 10/05/14   Kathleen Honour, MD  solifenacin (VESICARE) 5 MG tablet Take 1 tablet (5 mg total) by mouth daily. Patient not taking: Reported on 10/21/2014 12/02/13   [name], MD     ROS: The patient denies fevers, chills, night sweats, unintentional weight loss, chest pain, palpitations, wheezing, dyspnea on exertion, nausea, vomiting, abdominal pain, dysuria, hematuria, melena, numbness, weakness, or tingling.   All other systems have been reviewed and were otherwise negative with the exception of those mentioned in the HPI and as above.    PHYSICAL EXAM: Filed Vitals:   10/21/14 0829  BP: 116/62  Pulse: 58  Temp: 97 F (36.1 C)  Resp: 16   Body mass index is 35.93 kg/(m^2).   General: Alert, no acute distress HEENT:  Normocephalic, atraumatic, oropharynx patent. Eye: [name] [facility] Cardiovascular:  Regular rate and rhythm, no rubs murmurs or gallops.  No Carotid bruits, radial pulse intact. No pedal edema.  Respiratory: Bilateral and expiratory wheezes. Breath sounds symmetrical. No dullness. Abdominal: No organomegaly, abdomen is soft and non-tender, positive bowel sounds.  No masses. Musculoskeletal: Gait intact. No edema, tenderness Skin: No rashes. Neurologic: Facial musculature symmetric. Psychiatric: Patient acts appropriately throughout our interaction. Lymphatic: No cervical or submandibular lymphadenopathy    LABS:    EKG/XRAY:   Primary read interpreted by Dr. Everlene Farrier at [facility].   ASSESSMENT/PLAN: Patient improving. Peak flow up to 250. I again instructed her to be sure she uses her inhaler every 4 hours. I placed her on a taper dose of prednisone.  She will take 40 mg daily for 2 days and then 20 mg daily for 4 days. Recheck in one week.I personally performed the services described in this documentation, which was scribed in my presence. The recorded information has been reviewed and is accurate. Posttreatment peak flow was up to 250.  By signing my name below, I, Zola Button, attest that this documentation has been prepared under the direction and in the presence of Kathleen Queen, MD.  Electronically Signed: Zola Button, Medical Scribe. 10/21/2014. 8:39 AM.   Johney Maine sideeffects, risk and benefits, and alternatives of medications d/w patient. Patient is aware that all medications have potential sideeffects and we are unable to predict every sideeffect or drug-drug interaction  that may occur.  Kathleen Queen MD 10/21/2014 8:39 AM

## 2014-10-25 ENCOUNTER — Other Ambulatory Visit: Payer: Self-pay | Admitting: Internal Medicine

## 2014-10-27 ENCOUNTER — Ambulatory Visit: Payer: Medicare Other | Admitting: Emergency Medicine

## 2014-10-27 DIAGNOSIS — M791 Myalgia: Secondary | ICD-10-CM | POA: Diagnosis not present

## 2014-10-27 DIAGNOSIS — M5030 Other cervical disc degeneration, unspecified cervical region: Secondary | ICD-10-CM | POA: Diagnosis not present

## 2014-10-27 DIAGNOSIS — M9903 Segmental and somatic dysfunction of lumbar region: Secondary | ICD-10-CM | POA: Diagnosis not present

## 2014-10-27 DIAGNOSIS — M9901 Segmental and somatic dysfunction of cervical region: Secondary | ICD-10-CM | POA: Diagnosis not present

## 2014-10-27 DIAGNOSIS — M5136 Other intervertebral disc degeneration, lumbar region: Secondary | ICD-10-CM | POA: Diagnosis not present

## 2014-10-27 DIAGNOSIS — M5134 Other intervertebral disc degeneration, thoracic region: Secondary | ICD-10-CM | POA: Diagnosis not present

## 2014-10-27 DIAGNOSIS — M9902 Segmental and somatic dysfunction of thoracic region: Secondary | ICD-10-CM | POA: Diagnosis not present

## 2014-10-28 ENCOUNTER — Ambulatory Visit (INDEPENDENT_AMBULATORY_CARE_PROVIDER_SITE_OTHER): Payer: Medicare Other | Admitting: Emergency Medicine

## 2014-10-28 VITALS — BP 120/76 | HR 79 | Temp 98.1°F | Resp 16 | Ht 63.0 in | Wt 199.8 lb

## 2014-10-28 DIAGNOSIS — Z1329 Encounter for screening for other suspected endocrine disorder: Secondary | ICD-10-CM | POA: Diagnosis not present

## 2014-10-28 DIAGNOSIS — R062 Wheezing: Secondary | ICD-10-CM | POA: Diagnosis not present

## 2014-10-28 DIAGNOSIS — R5383 Other fatigue: Secondary | ICD-10-CM | POA: Diagnosis not present

## 2014-10-28 DIAGNOSIS — R7989 Other specified abnormal findings of blood chemistry: Secondary | ICD-10-CM

## 2014-10-28 DIAGNOSIS — R799 Abnormal finding of blood chemistry, unspecified: Secondary | ICD-10-CM | POA: Diagnosis not present

## 2014-10-28 DIAGNOSIS — R1010 Upper abdominal pain, unspecified: Secondary | ICD-10-CM | POA: Diagnosis not present

## 2014-10-28 DIAGNOSIS — Z13220 Encounter for screening for lipoid disorders: Secondary | ICD-10-CM | POA: Diagnosis not present

## 2014-10-28 DIAGNOSIS — R350 Frequency of micturition: Secondary | ICD-10-CM

## 2014-10-28 DIAGNOSIS — E785 Hyperlipidemia, unspecified: Secondary | ICD-10-CM

## 2014-10-28 DIAGNOSIS — R945 Abnormal results of liver function studies: Secondary | ICD-10-CM

## 2014-10-28 DIAGNOSIS — R739 Hyperglycemia, unspecified: Secondary | ICD-10-CM | POA: Diagnosis not present

## 2014-10-28 LAB — POCT URINALYSIS DIP (MANUAL ENTRY)
BILIRUBIN UA: NEGATIVE
BILIRUBIN UA: NEGATIVE
Glucose, UA: NEGATIVE
Nitrite, UA: NEGATIVE
PH UA: 7
Protein Ur, POC: NEGATIVE
RBC UA: NEGATIVE
Spec Grav, UA: 1.015
Urobilinogen, UA: 0.2

## 2014-10-28 LAB — HEPATIC FUNCTION PANEL
ALT: 45 U/L — ABNORMAL HIGH (ref 6–29)
AST: 24 U/L (ref 10–35)
Albumin: 3.9 g/dL (ref 3.6–5.1)
Alkaline Phosphatase: 84 U/L (ref 33–130)
BILIRUBIN INDIRECT: 0.6 mg/dL (ref 0.2–1.2)
BILIRUBIN TOTAL: 0.7 mg/dL (ref 0.2–1.2)
Bilirubin, Direct: 0.1 mg/dL (ref <=0.2)
Total Protein: 6.9 g/dL (ref 6.1–8.1)

## 2014-10-28 LAB — POC MICROSCOPIC URINALYSIS (UMFC): Mucus: ABSENT

## 2014-10-28 LAB — GLUCOSE, POCT (MANUAL RESULT ENTRY): POC GLUCOSE: 90 mg/dL (ref 70–99)

## 2014-10-28 LAB — LIPID PANEL
Cholesterol: 189 mg/dL (ref 125–200)
HDL: 67 mg/dL (ref >=46)
LDL CALC: 73 mg/dL (ref <130)
TRIGLYCERIDES: 244 mg/dL — AB (ref <150)
Total CHOL/HDL Ratio: 2.8 Ratio (ref <=5.0)
VLDL: 49 mg/dL — AB (ref <30)

## 2014-10-28 LAB — TSH: TSH: 2.279 u[IU]/mL (ref 0.350–4.500)

## 2014-10-28 NOTE — Progress Notes (Addendum)
This chart was scribed for Kathleen Jordan, MD by Thea Alken, ED Scribe. This patient was seen in room 3 and the patient's care was started at 10:18 AM.  Chief Complaint:  Chief Complaint  Patient presents with  . Follow-up    HPI: Kathleen Pacheco is a 79 y.o. female who reports to [facility] today for a follow up. She was initially seen here 12 days ago for a cough and was diagnosed with bronchospasm. She has had 2 follow up visits since initial visit with gradual improvement to cough. Today ,she reports improvement since last recheck which was 7 days ago. States she has been breathing better and has not been coughing as much. She has 1 day left of prednisone and has an inhaler at home if needed.  Pt reports urinary frequency. States she has been urinating every hour.  Pt would like to schedule a colonoscopy and have them check for pylori.  Past Medical History  Diagnosis Date  . Thyroid disease   . Migraine   . Hypertension   . Hx of bladder infections   . Vertigo   . Bradycardia   . Hyperlipidemia   . Gout    Past Surgical History  Procedure Laterality Date  . Abdominal hysterectomy    . Parathyroidectomy    . Gallbladder surgery    . Cholecystectomy     Social History   Social History  . Marital Status: Divorced    Spouse Name: N/A  . Number of Children: N/A  . Years of Education: N/A   Social History Main Topics  . Smoking status: Never Smoker   . Smokeless tobacco: Never Used  . Alcohol Use: No  . Drug Use: No  . Sexual Activity: No     Comment: HYST    Other Topics Concern  . None   Social History Narrative   History reviewed. No pertinent family history. Allergies  Allergen Reactions  . Ace Inhibitors   . Other     Artificial sweeteners  . Penicillins     Causes yeast infection   Prior to Admission medications   Medication Sig Start Date End Date Taking? Authorizing Provider  albuterol (PROVENTIL HFA;VENTOLIN HFA) 108 (90 BASE) MCG/ACT inhaler  Inhale 2 puffs into the lungs every 6 (six) hours as needed for wheezing or shortness of breath (cough, shortness of breath or wheezing.). 10/16/14  Yes Wardell Honour, MD  albuterol (PROVENTIL HFA;VENTOLIN HFA) 108 (90 BASE) MCG/ACT inhaler Inhale 2 puffs into the lungs every 4 (four) hours as needed for wheezing or shortness of breath (cough, shortness of breath or wheezing.). 10/19/14  Yes Darlyne Russian, MD  amLODipine (NORVASC) 10 MG tablet Take 10 mg by mouth daily.   Yes Historical Provider, MD  aspirin EC 81 MG tablet Take 81 mg by mouth daily.   Yes Historical Provider, MD  hydrochlorothiazide (HYDRODIURIL) 25 MG tablet Take 25 mg by mouth daily.   Yes Historical Provider, MD  HYDROcodone-homatropine (HYCODAN) 5-1.5 MG/5ML syrup Take 5 mLs by mouth every 8 (eight) hours as needed for cough. 10/16/14  Yes Wardell Honour, MD  meloxicam (MOBIC) 15 MG tablet Take 15 mg by mouth daily.  06/28/13  Yes Historical Provider, MD  predniSONE (DELTASONE) 20 MG tablet Take 2 a day for  4 days then half tablet a day for 6 days 10/21/14  Yes Darlyne Russian, MD  rosuvastatin (CRESTOR) 10 MG tablet Take 10 mg by mouth daily.   Yes  Historical Provider, MD     ROS: The patient denies fevers, chills, night sweats, unintentional weight loss, chest pain, palpitations, wheezing, dyspnea on exertion, nausea, vomiting, abdominal pain, dysuria, hematuria, melena, numbness, weakness, or tingling.   All other systems have been reviewed and were otherwise negative with the exception of those mentioned in the HPI and as above.    PHYSICAL EXAM: Filed Vitals:   10/28/14 1008  BP: 120/76  Pulse: 79  Temp: 98.1 F (36.7 C)  Resp: 16   Body mass index is 35.4 kg/(m^2).   General: Alert, no acute distress HEENT:  Normocephalic, atraumatic, oropharynx patent. Eye: [name] [facility] Cardiovascular:  Regular rate and rhythm, no rubs murmurs or gallops.  No Carotid bruits, radial pulse intact. No pedal edema. iRespiratory:  Clear to auscultation bilaterally.  No wheezes, rales, or rhonchi.  No cyanosis, no use of accessory musculature Abdominal: No organomegaly, abdomen is soft and non-tender, positive bowel sounds.  No masses. Musculoskeletal: Gait intact. No edema, tenderness Skin: No rashes. Neurologic: Facial musculature symmetric. Psychiatric: Patient acts appropriately throughout our interaction. Lymphatic: No cervical or submandibular lymphadenopathy    LABS: Results for orders placed or performed in visit on 10/28/14  POCT glucose (manual entry)  Result Value Ref Range   POC Glucose 90 70 - 99 mg/dl  POCT urinalysis dipstick  Result Value Ref Range   Color, UA yellow yellow   Clarity, UA clear clear   Glucose, UA negative negative   Bilirubin, UA negative negative   Ketones, POC UA negative negative   Spec Grav, UA 1.015    Blood, UA negative negative   pH, UA 7.0    Protein Ur, POC negative negative   Urobilinogen, UA 0.2    Nitrite, UA Negative Negative   Leukocytes, UA small (1+) (A) Negative  POCT Microscopic Urinalysis ([facility])  Result Value Ref Range   WBC,UR,HPF,POC Few (A) None WBC/hpf   RBC,UR,HPF,POC None None RBC/hpf   Bacteria Few (A) None   Mucus Absent Absent   Epithelial Cells, UR Per Microscopy Moderate (A) None cells/hpf   Calcium Oxalate Crystal present      EKG/XRAY:   Primary read interpreted by Dr. Everlene Farrier at [facility].   ASSESSMENT/PLAN: Patient looks good no change in medication. We'll go ahead and culture her urine she did have a few white cells. Sugar is normal.   Gross sideeffects, risk and benefits, and alternatives of medications d/w patient. Patient is aware that all medications have potential sideeffects and we are unable to predict every sideeffect or drug-drug interaction that may occur.I personally performed the services described in this documentation, which was scribed in my presence. The recorded information has been reviewed and is accurate.  By  signing my name below, I, Raven Small, attest that this documentation has been prepared under the direction and in the presence of Kathleen Jordan, MD.  Electronically Signed: Thea Alken, ED Scribe. 10/28/2014. 10:30 AM.  [name] MD 10/28/2014 10:20 AM

## 2014-10-29 LAB — URINE CULTURE
COLONY COUNT: NO GROWTH
Organism ID, Bacteria: NO GROWTH

## 2014-10-31 ENCOUNTER — Other Ambulatory Visit: Payer: Self-pay | Admitting: Family Medicine

## 2014-11-07 NOTE — Telephone Encounter (Signed)
Dr Laney Pastor inst'd pt to f/up w/her PCP when he saw pt for gout on 09/03/14, but at 9/21 OV, Dr Tamala Julian reported "She currently does not have a primary physician". Can we RF?

## 2014-11-07 NOTE — Telephone Encounter (Signed)
Meds ordered this encounter  Medications  . Colchicine 0.6 MG CAPS    Sig: TAKE 1 TABLET BY MOUTH TWICE A DAY UNTIL FOOT SEEMS WELL THEN ONCE A DAY FOR 3 MORE DAYS    Dispense:  15 capsule    Refill:  0

## 2014-11-08 DIAGNOSIS — R7309 Other abnormal glucose: Secondary | ICD-10-CM | POA: Diagnosis not present

## 2014-11-08 DIAGNOSIS — R002 Palpitations: Secondary | ICD-10-CM | POA: Diagnosis not present

## 2014-11-08 DIAGNOSIS — E049 Nontoxic goiter, unspecified: Secondary | ICD-10-CM | POA: Diagnosis not present

## 2014-11-08 DIAGNOSIS — I10 Essential (primary) hypertension: Secondary | ICD-10-CM | POA: Diagnosis not present

## 2014-11-08 DIAGNOSIS — E785 Hyperlipidemia, unspecified: Secondary | ICD-10-CM | POA: Diagnosis not present

## 2014-11-08 DIAGNOSIS — I361 Nonrheumatic tricuspid (valve) insufficiency: Secondary | ICD-10-CM | POA: Diagnosis not present

## 2014-11-08 DIAGNOSIS — M1990 Unspecified osteoarthritis, unspecified site: Secondary | ICD-10-CM | POA: Diagnosis not present

## 2014-11-15 DIAGNOSIS — R140 Abdominal distension (gaseous): Secondary | ICD-10-CM | POA: Diagnosis not present

## 2014-11-15 DIAGNOSIS — I10 Essential (primary) hypertension: Secondary | ICD-10-CM | POA: Diagnosis not present

## 2014-11-15 DIAGNOSIS — Z86010 Personal history of colonic polyps: Secondary | ICD-10-CM | POA: Diagnosis not present

## 2014-12-01 DIAGNOSIS — Z1211 Encounter for screening for malignant neoplasm of colon: Secondary | ICD-10-CM | POA: Diagnosis not present

## 2014-12-01 DIAGNOSIS — K635 Polyp of colon: Secondary | ICD-10-CM | POA: Diagnosis not present

## 2014-12-01 DIAGNOSIS — D125 Benign neoplasm of sigmoid colon: Secondary | ICD-10-CM | POA: Diagnosis not present

## 2014-12-01 DIAGNOSIS — K5730 Diverticulosis of large intestine without perforation or abscess without bleeding: Secondary | ICD-10-CM | POA: Diagnosis not present

## 2014-12-01 DIAGNOSIS — D122 Benign neoplasm of ascending colon: Secondary | ICD-10-CM | POA: Diagnosis not present

## 2014-12-01 DIAGNOSIS — D3610 Benign neoplasm of peripheral nerves and autonomic nervous system, unspecified: Secondary | ICD-10-CM | POA: Diagnosis not present

## 2014-12-01 DIAGNOSIS — Z86010 Personal history of colonic polyps: Secondary | ICD-10-CM | POA: Diagnosis not present

## 2014-12-13 DIAGNOSIS — H04123 Dry eye syndrome of bilateral lacrimal glands: Secondary | ICD-10-CM | POA: Diagnosis not present

## 2015-01-11 DIAGNOSIS — M545 Low back pain: Secondary | ICD-10-CM | POA: Diagnosis not present

## 2015-01-13 DIAGNOSIS — M79672 Pain in left foot: Secondary | ICD-10-CM | POA: Diagnosis not present

## 2015-01-13 DIAGNOSIS — E785 Hyperlipidemia, unspecified: Secondary | ICD-10-CM | POA: Diagnosis not present

## 2015-01-13 DIAGNOSIS — M545 Low back pain: Secondary | ICD-10-CM | POA: Diagnosis not present

## 2015-01-13 DIAGNOSIS — M7062 Trochanteric bursitis, left hip: Secondary | ICD-10-CM | POA: Diagnosis not present

## 2015-01-13 DIAGNOSIS — I10 Essential (primary) hypertension: Secondary | ICD-10-CM | POA: Diagnosis not present

## 2015-01-18 ENCOUNTER — Ambulatory Visit (INDEPENDENT_AMBULATORY_CARE_PROVIDER_SITE_OTHER): Payer: Medicare Other | Admitting: Emergency Medicine

## 2015-01-18 ENCOUNTER — Ambulatory Visit (INDEPENDENT_AMBULATORY_CARE_PROVIDER_SITE_OTHER): Payer: Medicare Other

## 2015-01-18 VITALS — BP 118/78 | HR 71 | Temp 98.5°F | Resp 16 | Ht 63.0 in | Wt 207.2 lb

## 2015-01-18 DIAGNOSIS — M5441 Lumbago with sciatica, right side: Secondary | ICD-10-CM | POA: Diagnosis not present

## 2015-01-18 DIAGNOSIS — M4310 Spondylolisthesis, site unspecified: Secondary | ICD-10-CM

## 2015-01-18 DIAGNOSIS — Q762 Congenital spondylolisthesis: Secondary | ICD-10-CM

## 2015-01-18 DIAGNOSIS — R109 Unspecified abdominal pain: Secondary | ICD-10-CM

## 2015-01-18 DIAGNOSIS — R1010 Upper abdominal pain, unspecified: Secondary | ICD-10-CM | POA: Diagnosis not present

## 2015-01-18 LAB — POCT URINALYSIS DIP (MANUAL ENTRY)
Bilirubin, UA: NEGATIVE
Glucose, UA: NEGATIVE
Ketones, POC UA: NEGATIVE
Nitrite, UA: NEGATIVE
PH UA: 7
PROTEIN UA: NEGATIVE
SPEC GRAV UA: 1.015
UROBILINOGEN UA: 0.2

## 2015-01-18 LAB — POCT CBC
GRANULOCYTE PERCENT: 63.7 % (ref 37–80)
HEMATOCRIT: 41.8 % (ref 37.7–47.9)
Hemoglobin: 13.7 g/dL (ref 12.2–16.2)
Lymph, poc: 3.3 (ref 0.6–3.4)
MCH: 27.7 pg (ref 27–31.2)
MCHC: 32.9 g/dL (ref 31.8–35.4)
MCV: 84.4 fL (ref 80–97)
MID (CBC): 0.4 (ref 0–0.9)
MPV: 8.1 fL (ref 0–99.8)
POC Granulocyte: 6.4 (ref 2–6.9)
POC LYMPH %: 32.7 % (ref 10–50)
POC MID %: 3.6 % (ref 0–12)
Platelet Count, POC: 206 10*3/uL (ref 142–424)
RBC: 4.95 M/uL (ref 4.04–5.48)
RDW, POC: 15.1 %
WBC: 10 10*3/uL (ref 4.6–10.2)

## 2015-01-18 LAB — POC MICROSCOPIC URINALYSIS (UMFC): MUCUS RE: ABSENT

## 2015-01-18 MED ORDER — TRAMADOL HCL 50 MG PO TABS: 50.0000 mg | ORAL_TABLET | Freq: Three times a day (TID) | ORAL | Status: DC | PRN

## 2015-01-18 NOTE — Progress Notes (Signed)
Patient ID: Kathleen Pacheco, female   DOB: [birth year], 80 y.o.   MRN: [id number]     By signing my name below, I, Zola Button, attest that this documentation has been prepared under the direction and in the presence of [name], MD.  Electronically Signed: Zola Button, Medical Scribe. 01/18/2015. 8:21 AM.   Chief Complaint:  Chief Complaint  Patient presents with  . Flank Pain    right side pain    HPI: Kathleen Pacheco is a 80 y.o. female who reports to [facility] today complaining of excruciating right flank pain that started 4-5 days ago. The pain is worse with movement and twisting. She went to her orthopedist because she thought she was having problems with her back, but her orthopedist told her the pain could be due to her kidney or GI. Patient denies fever, chills, nausea and vomiting. She also denies history of kidney stones. Her PSHx includes cholecystectomy. Patient notes that while she was in [place] over the holidays, her left leg had given out. She went to an urgent care and was diagnosed with bursitis. She also notes that she had broken a toe.  Past Medical History  Diagnosis Date  . Thyroid disease   . Migraine   . Hypertension   . Hx of bladder infections   . Vertigo   . Bradycardia   . Hyperlipidemia   . Gout    Past Surgical History  Procedure Laterality Date  . Abdominal hysterectomy    . Parathyroidectomy    . Gallbladder surgery    . Cholecystectomy     Social History   Social History  . Marital Status: Divorced    Spouse Name: N/A  . Number of Children: N/A  . Years of Education: N/A   Social History Main Topics  . Smoking status: Never Smoker   . Smokeless tobacco: Never Used  . Alcohol Use: No  . Drug Use: No  . Sexual Activity: No     Comment: HYST    Other Topics Concern  . None   Social History Narrative   History reviewed. No pertinent family history. Allergies  Allergen Reactions  . Ace Inhibitors   . Other     Artificial sweeteners   . Penicillins     Causes yeast infection   Prior to Admission medications   Medication Sig Start Date End Date Taking? Authorizing Provider  amLODipine (NORVASC) 10 MG tablet Take 10 mg by mouth daily.   Yes Historical Provider, MD  aspirin EC 81 MG tablet Take 81 mg by mouth daily.   Yes Historical Provider, MD  hydrochlorothiazide (HYDRODIURIL) 25 MG tablet Take 25 mg by mouth daily.   Yes Historical Provider, MD  meloxicam (MOBIC) 15 MG tablet Take 15 mg by mouth daily.  06/28/13  Yes Historical Provider, MD  albuterol (PROVENTIL HFA;VENTOLIN HFA) 108 (90 BASE) MCG/ACT inhaler Inhale 2 puffs into the lungs every 6 (six) hours as needed for wheezing or shortness of breath (cough, shortness of breath or wheezing.). Patient not taking: Reported on 01/18/2015 10/16/14   Wardell Honour, MD  albuterol (PROVENTIL HFA;VENTOLIN HFA) 108 (90 BASE) MCG/ACT inhaler Inhale 2 puffs into the lungs every 4 (four) hours as needed for wheezing or shortness of breath (cough, shortness of breath or wheezing.). Patient not taking: Reported on 01/18/2015 10/19/14   Darlyne Russian, MD  Colchicine 0.6 MG CAPS TAKE 1 TABLET BY MOUTH TWICE A DAY UNTIL FOOT SEEMS WELL THEN ONCE A DAY FOR  3 MORE DAYS Patient not taking: Reported on 01/18/2015 11/07/14   Chelle Jeffery, PA-C  HYDROcodone-homatropine (HYCODAN) 5-1.5 MG/5ML syrup Take 5 mLs by mouth every 8 (eight) hours as needed for cough. Patient not taking: Reported on 01/18/2015 10/16/14   Wardell Honour, MD  rosuvastatin (CRESTOR) 10 MG tablet Take 10 mg by mouth daily. Reported on 01/18/2015    Historical Provider, MD     ROS: The patient denies fevers, chills, night sweats, unintentional weight loss, chest pain, palpitations, wheezing, dyspnea on exertion, nausea, vomiting, abdominal pain, dysuria, hematuria, melena, numbness, weakness, or tingling.   All other systems have been reviewed and were otherwise negative with the exception of those mentioned in the HPI and as  above.    PHYSICAL EXAM: Filed Vitals:   01/18/15 0811  BP: 118/78  Pulse: 71  Temp: 98.5 F (36.9 C)  Resp: 16   Body mass index is 36.71 kg/(m^2).   General: Alert, cooperative HEENT:  Normocephalic, atraumatic, oropharynx patent. Eye: [name] [facility] Cardiovascular:  Regular rate and rhythm.  Respiratory: Clear to auscultation bilaterally.  No wheezes, rales, or rhonchi.  No cyanosis, no use of accessory musculature Abdominal: Mild extreme lateral discomfort. No other abdominal tenderness. No true flank tenderness.  Musculoskeletal: Gait intact. Tender right SI joint down into the right buttocks. Skin: No rashes. Neurologic: Facial musculature symmetric. Psychiatric: Patient acts appropriately throughout our interaction. Lymphatic: No cervical or submandibular lymphadenopathy    LABS: Results for orders placed or performed in visit on 01/18/15  POCT CBC  Result Value Ref Range   WBC 10.0 4.6 - 10.2 K/uL   Lymph, poc 3.3 0.6 - 3.4   POC LYMPH PERCENT 32.7 10 - 50 %L   MID (cbc) 0.4 0 - 0.9   POC MID % 3.6 0 - 12 %M   POC Granulocyte 6.4 2 - 6.9   Granulocyte percent 63.7 37 - 80 %G   RBC 4.95 4.04 - 5.48 M/uL   Hemoglobin 13.7 12.2 - 16.2 g/dL   HCT, POC 41.8 37.7 - 47.9 %   MCV 84.4 80 - 97 fL   MCH, POC 27.7 27 - 31.2 pg   MCHC 32.9 31.8 - 35.4 g/dL   RDW, POC 15.1 %   Platelet Count, POC 206 142 - 424 K/uL   MPV 8.1 0 - 99.8 fL  POCT Microscopic Urinalysis ([facility])  Result Value Ref Range   WBC,UR,HPF,POC Few (A) None WBC/hpf   RBC,UR,HPF,POC Few (A) None RBC/hpf   Bacteria None None, Too numerous to count   Mucus Absent Absent   Epithelial Cells, UR Per Microscopy Few (A) None, Too numerous to count cells/hpf   Crystals starch   POCT urinalysis dipstick  Result Value Ref Range   Color, UA yellow yellow   Clarity, UA clear clear   Glucose, UA negative negative   Bilirubin, UA negative negative   Ketones, POC UA negative negative   Spec Grav, UA 1.015      Blood, UA trace-intact (A) negative   pH, UA 7.0    Protein Ur, POC negative negative   Urobilinogen, UA 0.2    Nitrite, UA Negative Negative   Leukocytes, UA small (1+) (A) Negative    Meds ordered this encounter  Medications  . traMADol (ULTRAM) 50 MG tablet    Sig: Take 1 tablet (50 mg total) by mouth every 8 (eight) hours as needed.    Dispense:  30 tablet    Refill:  0  EKG/XRAY:   Primary read interpreted by Dr. Everlene Farrier at [facility] DG Abdomen - Calcification right upper abdomen. Stool present right colon. 1 cm sclerotic density in the ischial ramus on the right. Surgical clips, right upper abdomen. Please comment. DG Lumbar Spine - There are degenerative changes, L2-L3. Vascular calcification and 1 cm anterolisthesis L4 on L5.   ASSESSMENT/PLAN: I believe this pain is musculoskeletal. Will proceed with MRI of lumbar spine. Urine culture will also be ordered. She was given tramadol to take for pain.I personally performed the services described in this documentation, which was scribed in my presence. The recorded information has been reviewed and is accurate.    Gross sideeffects, risk and benefits, and alternatives of medications d/w patient. Patient is aware that all medications have potential sideeffects and we are unable to predict every sideeffect or drug-drug interaction that may occur.  [name] MD 01/18/2015 8:21 AM

## 2015-01-18 NOTE — Patient Instructions (Signed)
Urine culture will be done. Will proceed with MRI lumbar spine. She has Ultram at home for pain. There is a sclerotic area in the ischial ramus. Radiology reading pending.

## 2015-01-19 LAB — URINE CULTURE

## 2015-01-27 ENCOUNTER — Other Ambulatory Visit: Payer: Self-pay | Admitting: Family Medicine

## 2015-02-03 DIAGNOSIS — M545 Low back pain: Secondary | ICD-10-CM | POA: Diagnosis not present

## 2015-02-06 DIAGNOSIS — M545 Low back pain: Secondary | ICD-10-CM | POA: Diagnosis not present

## 2015-02-09 DIAGNOSIS — M545 Low back pain: Secondary | ICD-10-CM | POA: Diagnosis not present

## 2015-02-13 DIAGNOSIS — M545 Low back pain: Secondary | ICD-10-CM | POA: Diagnosis not present

## 2015-02-16 DIAGNOSIS — M545 Low back pain: Secondary | ICD-10-CM | POA: Diagnosis not present

## 2015-02-17 DIAGNOSIS — R350 Frequency of micturition: Secondary | ICD-10-CM | POA: Diagnosis not present

## 2015-02-17 DIAGNOSIS — N390 Urinary tract infection, site not specified: Secondary | ICD-10-CM | POA: Diagnosis not present

## 2015-02-20 DIAGNOSIS — M9901 Segmental and somatic dysfunction of cervical region: Secondary | ICD-10-CM | POA: Diagnosis not present

## 2015-02-20 DIAGNOSIS — M9903 Segmental and somatic dysfunction of lumbar region: Secondary | ICD-10-CM | POA: Diagnosis not present

## 2015-02-20 DIAGNOSIS — M791 Myalgia: Secondary | ICD-10-CM | POA: Diagnosis not present

## 2015-02-20 DIAGNOSIS — M5030 Other cervical disc degeneration, unspecified cervical region: Secondary | ICD-10-CM | POA: Diagnosis not present

## 2015-02-20 DIAGNOSIS — M9902 Segmental and somatic dysfunction of thoracic region: Secondary | ICD-10-CM | POA: Diagnosis not present

## 2015-02-20 DIAGNOSIS — M545 Low back pain: Secondary | ICD-10-CM | POA: Diagnosis not present

## 2015-02-20 DIAGNOSIS — M5136 Other intervertebral disc degeneration, lumbar region: Secondary | ICD-10-CM | POA: Diagnosis not present

## 2015-02-20 DIAGNOSIS — M5134 Other intervertebral disc degeneration, thoracic region: Secondary | ICD-10-CM | POA: Diagnosis not present

## 2015-03-02 DIAGNOSIS — M545 Low back pain: Secondary | ICD-10-CM | POA: Diagnosis not present

## 2015-03-06 DIAGNOSIS — M545 Low back pain: Secondary | ICD-10-CM | POA: Diagnosis not present

## 2015-03-09 DIAGNOSIS — M545 Low back pain: Secondary | ICD-10-CM | POA: Diagnosis not present

## 2015-03-10 DIAGNOSIS — M1990 Unspecified osteoarthritis, unspecified site: Secondary | ICD-10-CM | POA: Diagnosis not present

## 2015-03-10 DIAGNOSIS — R7309 Other abnormal glucose: Secondary | ICD-10-CM | POA: Diagnosis not present

## 2015-03-10 DIAGNOSIS — I10 Essential (primary) hypertension: Secondary | ICD-10-CM | POA: Diagnosis not present

## 2015-03-10 DIAGNOSIS — E785 Hyperlipidemia, unspecified: Secondary | ICD-10-CM | POA: Diagnosis not present

## 2015-03-10 DIAGNOSIS — I361 Nonrheumatic tricuspid (valve) insufficiency: Secondary | ICD-10-CM | POA: Diagnosis not present

## 2015-03-20 DIAGNOSIS — M545 Low back pain: Secondary | ICD-10-CM | POA: Diagnosis not present

## 2015-03-21 DIAGNOSIS — Z1272 Encounter for screening for malignant neoplasm of vagina: Secondary | ICD-10-CM | POA: Diagnosis not present

## 2015-03-21 DIAGNOSIS — D229 Melanocytic nevi, unspecified: Secondary | ICD-10-CM | POA: Diagnosis not present

## 2015-03-21 DIAGNOSIS — Z01419 Encounter for gynecological examination (general) (routine) without abnormal findings: Secondary | ICD-10-CM | POA: Diagnosis not present

## 2015-03-21 DIAGNOSIS — M8580 Other specified disorders of bone density and structure, unspecified site: Secondary | ICD-10-CM | POA: Diagnosis not present

## 2015-03-21 DIAGNOSIS — Z6836 Body mass index (BMI) 36.0-36.9, adult: Secondary | ICD-10-CM | POA: Diagnosis not present

## 2015-03-21 DIAGNOSIS — Z1231 Encounter for screening mammogram for malignant neoplasm of breast: Secondary | ICD-10-CM | POA: Diagnosis not present

## 2015-03-21 DIAGNOSIS — R32 Unspecified urinary incontinence: Secondary | ICD-10-CM | POA: Diagnosis not present

## 2015-03-22 DIAGNOSIS — M545 Low back pain: Secondary | ICD-10-CM | POA: Diagnosis not present

## 2015-03-22 DIAGNOSIS — M79601 Pain in right arm: Secondary | ICD-10-CM | POA: Diagnosis not present

## 2015-03-22 DIAGNOSIS — M79675 Pain in left toe(s): Secondary | ICD-10-CM | POA: Diagnosis not present

## 2015-03-27 DIAGNOSIS — M545 Low back pain: Secondary | ICD-10-CM | POA: Diagnosis not present

## 2015-03-28 ENCOUNTER — Ambulatory Visit (INDEPENDENT_AMBULATORY_CARE_PROVIDER_SITE_OTHER): Payer: Medicare Other | Admitting: Family Medicine

## 2015-03-28 VITALS — BP 116/78 | HR 67 | Temp 97.8°F | Resp 18 | Wt 198.4 lb

## 2015-03-28 DIAGNOSIS — J4521 Mild intermittent asthma with (acute) exacerbation: Secondary | ICD-10-CM | POA: Diagnosis not present

## 2015-03-28 MED ORDER — METHYLPREDNISOLONE ACETATE 80 MG/ML IJ SUSP
80.0000 mg | Freq: Once | INTRAMUSCULAR | Status: AC
  Administered 2015-03-28: 80 mg via INTRAMUSCULAR

## 2015-03-28 MED ORDER — HYDROCODONE-HOMATROPINE 5-1.5 MG/5ML PO SYRP: 5.0000 mL | ORAL_SOLUTION | Freq: Three times a day (TID) | ORAL | Status: DC | PRN

## 2015-03-28 MED ORDER — ALBUTEROL SULFATE HFA 108 (90 BASE) MCG/ACT IN AERS: 2.0000 | INHALATION_SPRAY | Freq: Four times a day (QID) | RESPIRATORY_TRACT | Status: DC | PRN

## 2015-03-28 NOTE — Patient Instructions (Addendum)
Let me know if you're not feeling better by the weekend.  Look up the poem, "Evangeline" by Longfellow about the ethnic cleansing in [place] by our dear friends, the [place].

## 2015-03-28 NOTE — Progress Notes (Signed)
By signing my name below, I, Moises Blood, attest that this documentation has been prepared under the direction and in the presence of Robyn Haber, MD. Electronically Signed: Moises Blood, [place]. 03/28/2015 , 4:35 PM .  Patient was seen in room 11 .   Patient ID: Kathleen Pacheco MRN: [id number], DOB: Apr 21, 1935, 80 y.o. Date of Encounter: 03/28/2015  Primary Physician: Kennyth Arnold, FNP  Chief Complaint:  Chief Complaint  Patient presents with  . Allergies    coughing, runny nose x1 week    HPI:  Kathleen Pacheco is a 80 y.o. female who presents to [facility] complaining of coughs and rhinorrhea due to allergies that's been ongoing for a week. Today, she's been feeling worse with choking and trouble swallowing. She's been taking zyrtec with relief. She denies history of asthma.   She recently moved back after 11 years from [place], [place].  She's currently retired. She was previously an Psychologist, prison and probation services.   Past Medical History  Diagnosis Date  . Thyroid disease   . Migraine   . Hypertension   . Hx of bladder infections   . Vertigo   . Bradycardia   . Hyperlipidemia   . Gout      Home Meds: Prior to Admission medications   Medication Sig Start Date End Date Taking? Authorizing Provider  amLODipine (NORVASC) 10 MG tablet Take 10 mg by mouth daily.   Yes Historical Provider, MD  aspirin EC 81 MG tablet Take 81 mg by mouth daily.   Yes Historical Provider, MD  hydrochlorothiazide (HYDRODIURIL) 25 MG tablet Take 25 mg by mouth daily.   Yes Historical Provider, MD  meloxicam (MOBIC) 15 MG tablet Take 15 mg by mouth daily.  06/28/13  Yes Historical Provider, MD  rosuvastatin (CRESTOR) 10 MG tablet Take 10 mg by mouth daily. Reported on 01/18/2015   Yes Historical Provider, MD  traMADol (ULTRAM) 50 MG tablet Take 1 tablet (50 mg total) by mouth every 8 (eight) hours as needed. 01/18/15  Yes Darlyne Russian, MD  albuterol (PROVENTIL HFA;VENTOLIN HFA)  108 (90 BASE) MCG/ACT inhaler Inhale 2 puffs into the lungs every 6 (six) hours as needed for wheezing or shortness of breath (cough, shortness of breath or wheezing.). Patient not taking: Reported on 01/18/2015 10/16/14   Wardell Honour, MD  albuterol (PROVENTIL HFA;VENTOLIN HFA) 108 (90 BASE) MCG/ACT inhaler Inhale 2 puffs into the lungs every 4 (four) hours as needed for wheezing or shortness of breath (cough, shortness of breath or wheezing.). Patient not taking: Reported on 01/18/2015 10/19/14   Darlyne Russian, MD  Colchicine 0.6 MG CAPS TAKE 1 TABLET BY MOUTH TWICE A DAY UNTIL FOOT SEEMS WELL THEN ONCE A DAY FOR 3 MORE DAYS Patient not taking: Reported on 01/18/2015 11/07/14   Chelle Jeffery, PA-C  HYDROcodone-homatropine (HYCODAN) 5-1.5 MG/5ML syrup Take 5 mLs by mouth every 8 (eight) hours as needed for cough. Patient not taking: Reported on 01/18/2015 10/16/14   Wardell Honour, MD    Allergies:  Allergies  Allergen Reactions  . Ace Inhibitors   . Other     Artificial sweeteners  . Penicillins     Causes yeast infection    Social History   Social History  . Marital Status: Divorced    Spouse Name: N/A  . Number of Children: N/A  . Years of Education: N/A   Occupational History  . Not on file.   Social History Main Topics  .  Smoking status: Never Smoker   . Smokeless tobacco: Never Used  . Alcohol Use: No  . Drug Use: No  . Sexual Activity: No     Comment: HYST    Other Topics Concern  . Not on file   Social History Narrative     Review of Systems: Constitutional: negative for fever, chills, night sweats, weight changes, or fatigue HEENT: negative for vision changes, hearing loss, congestion, ST, epistaxis, or sinus pressure; positive for rhinorrhea, trouble swallowing, seasonal allergies Cardiovascular: negative for chest pain or palpitations Respiratory: negative for hemoptysis, shortness of breath; positive for cough, choking, wheezing Abdominal: negative for abdominal  pain, nausea, vomiting, diarrhea, or constipation Dermatological: negative for rash Neurologic: negative for headache, dizziness, or syncope All other systems reviewed and are otherwise negative with the exception to those above and in the HPI.  Physical Exam:  Blood pressure 116/78, pulse 67, temperature 97.8 F (36.6 C), temperature source Oral, resp. rate 18, weight 198 lb 6.4 oz (89.994 kg), SpO2 96 %., Body mass index is 35.15 kg/(m^2). General: Well developed, well nourished, in no acute distress. Head: Normocephalic, atraumatic, eyes without discharge, sclera non-icteric, nares are without discharge. Bilateral auditory canals clear, TM's are without perforation, pearly grey and translucent with reflective cone of light bilaterally. Oral cavity moist, posterior pharynx without exudate, erythema, peritonsillar abscess, or post nasal drip.  Neck: Supple. No thyromegaly. Full ROM. No lymphadenopathy. Lungs: Clear bilaterally to auscultation without rales, or rhonchi. Breathing is unlabored. expiratory wheezes bilaterally Heart: RRR with S1 S2. No murmurs, rubs, or gallops appreciated. Abdomen: Soft, non-tender, non-distended with normoactive bowel sounds. No hepatomegaly. No rebound/guarding. No obvious abdominal masses. Msk:  Strength and tone normal for age. Extremities/Skin: Warm and dry. No clubbing or cyanosis. No edema. No rashes or suspicious lesions. Neuro: Alert and oriented X 3. Moves all extremities spontaneously. Gait is normal. CNII-XII grossly in tact. Psych:  Responds to questions appropriately with a normal affect.     ASSESSMENT AND PLAN:  79 y.o. year old female with  This chart was scribed in my presence and reviewed by me personally.    ICD-9-CM ICD-10-CM   1. Asthmatic bronchitis, mild intermittent, with acute exacerbation 493.92 J45.21 albuterol (PROVENTIL HFA;VENTOLIN HFA) 108 (90 Base) MCG/ACT inhaler     methylPREDNISolone acetate (DEPO-MEDROL) injection 80 mg       HYDROcodone-homatropine (HYCODAN) 5-1.5 MG/5ML syrup    Signed, Robyn Haber, MD 03/28/2015 4:35 PM

## 2015-03-29 ENCOUNTER — Other Ambulatory Visit: Payer: Self-pay | Admitting: Obstetrics and Gynecology

## 2015-03-29 DIAGNOSIS — R928 Other abnormal and inconclusive findings on diagnostic imaging of breast: Secondary | ICD-10-CM

## 2015-03-29 DIAGNOSIS — M7989 Other specified soft tissue disorders: Secondary | ICD-10-CM | POA: Diagnosis not present

## 2015-03-29 DIAGNOSIS — M79641 Pain in right hand: Secondary | ICD-10-CM | POA: Diagnosis not present

## 2015-03-29 DIAGNOSIS — M19041 Primary osteoarthritis, right hand: Secondary | ICD-10-CM | POA: Diagnosis not present

## 2015-03-31 DIAGNOSIS — M19041 Primary osteoarthritis, right hand: Secondary | ICD-10-CM | POA: Diagnosis not present

## 2015-03-31 DIAGNOSIS — M79641 Pain in right hand: Secondary | ICD-10-CM | POA: Diagnosis not present

## 2015-03-31 DIAGNOSIS — M7989 Other specified soft tissue disorders: Secondary | ICD-10-CM | POA: Diagnosis not present

## 2015-04-03 ENCOUNTER — Other Ambulatory Visit: Payer: Self-pay | Admitting: Obstetrics and Gynecology

## 2015-04-03 ENCOUNTER — Ambulatory Visit
Admission: RE | Admit: 2015-04-03 | Discharge: 2015-04-03 | Disposition: A | Discharge status: Home / Self Care | Payer: Medicare Other | Source: Ambulatory Visit | Attending: Obstetrics and Gynecology | Admitting: Obstetrics and Gynecology

## 2015-04-03 DIAGNOSIS — R928 Other abnormal and inconclusive findings on diagnostic imaging of breast: Secondary | ICD-10-CM

## 2015-04-03 DIAGNOSIS — N63 Unspecified lump in breast: Secondary | ICD-10-CM | POA: Diagnosis not present

## 2015-04-06 ENCOUNTER — Other Ambulatory Visit: Payer: Medicare Other

## 2015-04-10 DIAGNOSIS — M545 Low back pain: Secondary | ICD-10-CM | POA: Diagnosis not present

## 2015-04-13 ENCOUNTER — Ambulatory Visit
Admission: RE | Admit: 2015-04-13 | Discharge: 2015-04-13 | Disposition: A | Discharge status: Home / Self Care | Payer: Medicare Other | Source: Ambulatory Visit | Attending: Obstetrics and Gynecology | Admitting: Obstetrics and Gynecology

## 2015-04-13 ENCOUNTER — Other Ambulatory Visit: Payer: Self-pay | Admitting: Obstetrics and Gynecology

## 2015-04-13 DIAGNOSIS — R921 Mammographic calcification found on diagnostic imaging of breast: Secondary | ICD-10-CM | POA: Diagnosis not present

## 2015-04-13 DIAGNOSIS — R928 Other abnormal and inconclusive findings on diagnostic imaging of breast: Secondary | ICD-10-CM

## 2015-04-13 DIAGNOSIS — D242 Benign neoplasm of left breast: Secondary | ICD-10-CM | POA: Diagnosis not present

## 2015-05-05 DIAGNOSIS — M5134 Other intervertebral disc degeneration, thoracic region: Secondary | ICD-10-CM | POA: Diagnosis not present

## 2015-05-05 DIAGNOSIS — M791 Myalgia: Secondary | ICD-10-CM | POA: Diagnosis not present

## 2015-05-05 DIAGNOSIS — M9901 Segmental and somatic dysfunction of cervical region: Secondary | ICD-10-CM | POA: Diagnosis not present

## 2015-05-05 DIAGNOSIS — M9902 Segmental and somatic dysfunction of thoracic region: Secondary | ICD-10-CM | POA: Diagnosis not present

## 2015-05-05 DIAGNOSIS — M5030 Other cervical disc degeneration, unspecified cervical region: Secondary | ICD-10-CM | POA: Diagnosis not present

## 2015-05-05 DIAGNOSIS — M5136 Other intervertebral disc degeneration, lumbar region: Secondary | ICD-10-CM | POA: Diagnosis not present

## 2015-05-05 DIAGNOSIS — M9903 Segmental and somatic dysfunction of lumbar region: Secondary | ICD-10-CM | POA: Diagnosis not present

## 2015-06-07 DIAGNOSIS — R7309 Other abnormal glucose: Secondary | ICD-10-CM | POA: Diagnosis not present

## 2015-06-07 DIAGNOSIS — M1990 Unspecified osteoarthritis, unspecified site: Secondary | ICD-10-CM | POA: Diagnosis not present

## 2015-06-07 DIAGNOSIS — I361 Nonrheumatic tricuspid (valve) insufficiency: Secondary | ICD-10-CM | POA: Diagnosis not present

## 2015-06-07 DIAGNOSIS — I10 Essential (primary) hypertension: Secondary | ICD-10-CM | POA: Diagnosis not present

## 2015-06-07 DIAGNOSIS — E049 Nontoxic goiter, unspecified: Secondary | ICD-10-CM | POA: Diagnosis not present

## 2015-06-07 DIAGNOSIS — E785 Hyperlipidemia, unspecified: Secondary | ICD-10-CM | POA: Diagnosis not present

## 2015-06-09 DIAGNOSIS — M5136 Other intervertebral disc degeneration, lumbar region: Secondary | ICD-10-CM | POA: Diagnosis not present

## 2015-06-09 DIAGNOSIS — M9903 Segmental and somatic dysfunction of lumbar region: Secondary | ICD-10-CM | POA: Diagnosis not present

## 2015-06-09 DIAGNOSIS — M9901 Segmental and somatic dysfunction of cervical region: Secondary | ICD-10-CM | POA: Diagnosis not present

## 2015-06-09 DIAGNOSIS — M791 Myalgia: Secondary | ICD-10-CM | POA: Diagnosis not present

## 2015-06-09 DIAGNOSIS — M5134 Other intervertebral disc degeneration, thoracic region: Secondary | ICD-10-CM | POA: Diagnosis not present

## 2015-06-09 DIAGNOSIS — M9902 Segmental and somatic dysfunction of thoracic region: Secondary | ICD-10-CM | POA: Diagnosis not present

## 2015-06-09 DIAGNOSIS — M5030 Other cervical disc degeneration, unspecified cervical region: Secondary | ICD-10-CM | POA: Diagnosis not present

## 2015-06-16 DIAGNOSIS — M9903 Segmental and somatic dysfunction of lumbar region: Secondary | ICD-10-CM | POA: Diagnosis not present

## 2015-06-16 DIAGNOSIS — M9902 Segmental and somatic dysfunction of thoracic region: Secondary | ICD-10-CM | POA: Diagnosis not present

## 2015-06-16 DIAGNOSIS — M791 Myalgia: Secondary | ICD-10-CM | POA: Diagnosis not present

## 2015-06-16 DIAGNOSIS — M5136 Other intervertebral disc degeneration, lumbar region: Secondary | ICD-10-CM | POA: Diagnosis not present

## 2015-06-16 DIAGNOSIS — M5030 Other cervical disc degeneration, unspecified cervical region: Secondary | ICD-10-CM | POA: Diagnosis not present

## 2015-06-16 DIAGNOSIS — M5134 Other intervertebral disc degeneration, thoracic region: Secondary | ICD-10-CM | POA: Diagnosis not present

## 2015-06-16 DIAGNOSIS — M9901 Segmental and somatic dysfunction of cervical region: Secondary | ICD-10-CM | POA: Diagnosis not present

## 2015-07-14 DIAGNOSIS — M9903 Segmental and somatic dysfunction of lumbar region: Secondary | ICD-10-CM | POA: Diagnosis not present

## 2015-07-14 DIAGNOSIS — M9902 Segmental and somatic dysfunction of thoracic region: Secondary | ICD-10-CM | POA: Diagnosis not present

## 2015-07-14 DIAGNOSIS — M791 Myalgia: Secondary | ICD-10-CM | POA: Diagnosis not present

## 2015-07-14 DIAGNOSIS — M5134 Other intervertebral disc degeneration, thoracic region: Secondary | ICD-10-CM | POA: Diagnosis not present

## 2015-07-14 DIAGNOSIS — M5136 Other intervertebral disc degeneration, lumbar region: Secondary | ICD-10-CM | POA: Diagnosis not present

## 2015-07-14 DIAGNOSIS — M9901 Segmental and somatic dysfunction of cervical region: Secondary | ICD-10-CM | POA: Diagnosis not present

## 2015-07-14 DIAGNOSIS — M5030 Other cervical disc degeneration, unspecified cervical region: Secondary | ICD-10-CM | POA: Diagnosis not present

## 2015-07-26 DIAGNOSIS — M79642 Pain in left hand: Secondary | ICD-10-CM | POA: Diagnosis not present

## 2015-07-26 DIAGNOSIS — M19042 Primary osteoarthritis, left hand: Secondary | ICD-10-CM | POA: Diagnosis not present

## 2015-07-26 DIAGNOSIS — M19041 Primary osteoarthritis, right hand: Secondary | ICD-10-CM | POA: Diagnosis not present

## 2015-07-26 DIAGNOSIS — M79641 Pain in right hand: Secondary | ICD-10-CM | POA: Diagnosis not present

## 2015-09-06 DIAGNOSIS — M1990 Unspecified osteoarthritis, unspecified site: Secondary | ICD-10-CM | POA: Diagnosis not present

## 2015-09-06 DIAGNOSIS — E049 Nontoxic goiter, unspecified: Secondary | ICD-10-CM | POA: Diagnosis not present

## 2015-09-06 DIAGNOSIS — R7309 Other abnormal glucose: Secondary | ICD-10-CM | POA: Diagnosis not present

## 2015-09-06 DIAGNOSIS — E785 Hyperlipidemia, unspecified: Secondary | ICD-10-CM | POA: Diagnosis not present

## 2015-09-06 DIAGNOSIS — I10 Essential (primary) hypertension: Secondary | ICD-10-CM | POA: Diagnosis not present

## 2015-09-06 DIAGNOSIS — I361 Nonrheumatic tricuspid (valve) insufficiency: Secondary | ICD-10-CM | POA: Diagnosis not present

## 2015-09-06 DIAGNOSIS — R079 Chest pain, unspecified: Secondary | ICD-10-CM | POA: Diagnosis not present

## 2015-09-14 ENCOUNTER — Other Ambulatory Visit: Payer: Self-pay | Admitting: Obstetrics and Gynecology

## 2015-09-14 DIAGNOSIS — D242 Benign neoplasm of left breast: Secondary | ICD-10-CM

## 2015-09-15 DIAGNOSIS — M9903 Segmental and somatic dysfunction of lumbar region: Secondary | ICD-10-CM | POA: Diagnosis not present

## 2015-09-15 DIAGNOSIS — M5134 Other intervertebral disc degeneration, thoracic region: Secondary | ICD-10-CM | POA: Diagnosis not present

## 2015-09-15 DIAGNOSIS — M9902 Segmental and somatic dysfunction of thoracic region: Secondary | ICD-10-CM | POA: Diagnosis not present

## 2015-09-15 DIAGNOSIS — M5030 Other cervical disc degeneration, unspecified cervical region: Secondary | ICD-10-CM | POA: Diagnosis not present

## 2015-09-15 DIAGNOSIS — M791 Myalgia: Secondary | ICD-10-CM | POA: Diagnosis not present

## 2015-09-15 DIAGNOSIS — M9901 Segmental and somatic dysfunction of cervical region: Secondary | ICD-10-CM | POA: Diagnosis not present

## 2015-09-15 DIAGNOSIS — M5136 Other intervertebral disc degeneration, lumbar region: Secondary | ICD-10-CM | POA: Diagnosis not present

## 2015-10-11 ENCOUNTER — Ambulatory Visit
Admission: RE | Admit: 2015-10-11 | Discharge: 2015-10-11 | Disposition: A | Discharge status: Home / Self Care | Payer: Medicare Other | Source: Ambulatory Visit | Attending: Obstetrics and Gynecology | Admitting: Obstetrics and Gynecology

## 2015-10-11 DIAGNOSIS — N63 Unspecified lump in breast: Secondary | ICD-10-CM | POA: Diagnosis not present

## 2015-10-11 DIAGNOSIS — D242 Benign neoplasm of left breast: Secondary | ICD-10-CM

## 2015-10-19 DIAGNOSIS — Z01411 Encounter for gynecological examination (general) (routine) with abnormal findings: Secondary | ICD-10-CM | POA: Diagnosis not present

## 2015-10-19 DIAGNOSIS — R1031 Right lower quadrant pain: Secondary | ICD-10-CM | POA: Diagnosis not present

## 2015-10-19 DIAGNOSIS — Z01419 Encounter for gynecological examination (general) (routine) without abnormal findings: Secondary | ICD-10-CM | POA: Diagnosis not present

## 2015-10-19 DIAGNOSIS — Z9189 Other specified personal risk factors, not elsewhere classified: Secondary | ICD-10-CM | POA: Diagnosis not present

## 2015-10-19 LAB — BASIC METABOLIC PANEL: CREATININE: 1 mg/dL (ref <=1.1)

## 2015-10-23 DIAGNOSIS — R102 Pelvic and perineal pain: Secondary | ICD-10-CM | POA: Diagnosis not present

## 2015-11-03 DIAGNOSIS — M791 Myalgia: Secondary | ICD-10-CM | POA: Diagnosis not present

## 2015-11-03 DIAGNOSIS — M9901 Segmental and somatic dysfunction of cervical region: Secondary | ICD-10-CM | POA: Diagnosis not present

## 2015-11-03 DIAGNOSIS — M9903 Segmental and somatic dysfunction of lumbar region: Secondary | ICD-10-CM | POA: Diagnosis not present

## 2015-11-03 DIAGNOSIS — M5030 Other cervical disc degeneration, unspecified cervical region: Secondary | ICD-10-CM | POA: Diagnosis not present

## 2015-11-03 DIAGNOSIS — M5134 Other intervertebral disc degeneration, thoracic region: Secondary | ICD-10-CM | POA: Diagnosis not present

## 2015-11-03 DIAGNOSIS — M5136 Other intervertebral disc degeneration, lumbar region: Secondary | ICD-10-CM | POA: Diagnosis not present

## 2015-11-03 DIAGNOSIS — M9902 Segmental and somatic dysfunction of thoracic region: Secondary | ICD-10-CM | POA: Diagnosis not present

## 2015-11-10 ENCOUNTER — Encounter (HOSPITAL_COMMUNITY): Payer: Self-pay | Admitting: Emergency Medicine

## 2015-11-10 ENCOUNTER — Encounter: Payer: Self-pay | Admitting: Family Medicine

## 2015-11-10 ENCOUNTER — Ambulatory Visit (INDEPENDENT_AMBULATORY_CARE_PROVIDER_SITE_OTHER): Payer: Medicare Other | Admitting: Family Medicine

## 2015-11-10 ENCOUNTER — Emergency Department (HOSPITAL_COMMUNITY)
Admission: EM | Admit: 2015-11-10 | Discharge: 2015-11-10 | Disposition: A | Discharge status: Home / Self Care | Payer: Medicare Other | Attending: Emergency Medicine | Admitting: Emergency Medicine

## 2015-11-10 VITALS — BP 120/74 | HR 74 | Temp 98.1°F | Resp 18 | Ht 62.5 in | Wt 204.6 lb

## 2015-11-10 DIAGNOSIS — K5900 Constipation, unspecified: Secondary | ICD-10-CM | POA: Diagnosis not present

## 2015-11-10 DIAGNOSIS — R58 Hemorrhage, not elsewhere classified: Secondary | ICD-10-CM | POA: Diagnosis not present

## 2015-11-10 DIAGNOSIS — R55 Syncope and collapse: Secondary | ICD-10-CM | POA: Insufficient documentation

## 2015-11-10 DIAGNOSIS — Z7982 Long term (current) use of aspirin: Secondary | ICD-10-CM | POA: Diagnosis not present

## 2015-11-10 DIAGNOSIS — I10 Essential (primary) hypertension: Secondary | ICD-10-CM | POA: Diagnosis not present

## 2015-11-10 DIAGNOSIS — R100 Acute abdomen: Secondary | ICD-10-CM | POA: Diagnosis not present

## 2015-11-10 DIAGNOSIS — K625 Hemorrhage of anus and rectum: Secondary | ICD-10-CM

## 2015-11-10 DIAGNOSIS — R42 Dizziness and giddiness: Secondary | ICD-10-CM | POA: Diagnosis not present

## 2015-11-10 DIAGNOSIS — Z79899 Other long term (current) drug therapy: Secondary | ICD-10-CM | POA: Insufficient documentation

## 2015-11-10 DIAGNOSIS — Z791 Long term (current) use of non-steroidal anti-inflammatories (NSAID): Secondary | ICD-10-CM | POA: Diagnosis not present

## 2015-11-10 DIAGNOSIS — R531 Weakness: Secondary | ICD-10-CM | POA: Diagnosis not present

## 2015-11-10 LAB — COMPREHENSIVE METABOLIC PANEL
ALBUMIN: 4.1 g/dL (ref 3.5–5.0)
ALT: 25 U/L (ref 14–54)
AST: 31 U/L (ref 15–41)
Alkaline Phosphatase: 71 U/L (ref 38–126)
Anion gap: 5 (ref 5–15)
BILIRUBIN TOTAL: 0.9 mg/dL (ref 0.3–1.2)
BUN: 12 mg/dL (ref 6–20)
CHLORIDE: 106 mmol/L (ref 101–111)
CO2: 26 mmol/L (ref 22–32)
Calcium: 9.7 mg/dL (ref 8.9–10.3)
Creatinine, Ser: 0.79 mg/dL (ref 0.44–1.00)
GFR calc Af Amer: >60 mL/min (ref >60)
GFR calc non Af Amer: >60 mL/min (ref >60)
GLUCOSE: 102 mg/dL — AB (ref 65–99)
POTASSIUM: 3.7 mmol/L (ref 3.5–5.1)
SODIUM: 137 mmol/L (ref 135–145)
TOTAL PROTEIN: 7.5 g/dL (ref 6.5–8.1)

## 2015-11-10 LAB — POCT CBC
Granulocyte percent: 38.1 %G (ref 37–80)
HCT, POC: 39.4 % (ref 37.7–47.9)
Hemoglobin: 13.4 g/dL (ref 12.2–16.2)
Lymph, poc: 2.5 (ref 0.6–3.4)
MCH: 28.2 pg (ref 27–31.2)
MCHC: 34.1 g/dL (ref 31.8–35.4)
MCV: 82.8 fL (ref 80–97)
MID (CBC): 0.4 (ref 0–0.9)
MPV: 8.4 fL (ref 0–99.8)
PLATELET COUNT, POC: 183 10*3/uL (ref 142–424)
POC Granulocyte: 1.8 — AB (ref 2–6.9)
POC LYMPH %: 53.1 % — AB (ref 10–50)
POC MID %: 8.8 %M (ref 0–12)
RBC: 4.76 M/uL (ref 4.04–5.48)
RDW, POC: 15.9 %
WBC: 4.7 10*3/uL (ref 4.6–10.2)

## 2015-11-10 LAB — CBC
HEMATOCRIT: 43.4 % (ref 36.0–46.0)
HEMOGLOBIN: 14.3 g/dL (ref 12.0–15.0)
MCH: 27.6 pg (ref 26.0–34.0)
MCHC: 32.9 g/dL (ref 30.0–36.0)
MCV: 83.6 fL (ref 78.0–100.0)
Platelets: 232 10*3/uL (ref 150–400)
RBC: 5.19 MIL/uL — ABNORMAL HIGH (ref 3.87–5.11)
RDW: 15.2 % (ref 11.5–15.5)
WBC: 7 10*3/uL (ref 4.0–10.5)

## 2015-11-10 LAB — TYPE AND SCREEN
ABO/RH(D): A POS
Antibody Screen: NEGATIVE

## 2015-11-10 LAB — POC OCCULT BLOOD, ED: Fecal Occult Bld: POSITIVE — AB

## 2015-11-10 MED ORDER — SODIUM CHLORIDE 0.9 % IV BOLUS (SEPSIS)
1000.0000 mL | Freq: Once | INTRAVENOUS | Status: AC
  Administered 2015-11-10: 1000 mL via INTRAVENOUS

## 2015-11-10 NOTE — ED Triage Notes (Signed)
Per EMS patient from urgent care for rectal bleeding.

## 2015-11-10 NOTE — Discharge Instructions (Signed)
Call the gastroenterologist who saw you last year regarding rectal bleeding and constipation to schedule an office visit within the next one or 2 weeks. Or you can call Dr.Harwani. Return if you feel worse for any reason

## 2015-11-10 NOTE — ED Provider Notes (Signed)
[facility] DEPT Provider Note   CSN: [id number] Arrival date & time: 11/10/15  1515     History   Chief Complaint Chief Complaint  Patient presents with  . Rectal Bleeding    HPI Kathleen Pacheco is a 80 y.o. female.Patient had rectal bleeding onset this morning immediately after giving herself an enema for constipation. She no longer feels constipated. She is asymptomatic except feeling "generally weak and hungry" she denies abdominal pain denies chest pain denies shortness of breath denies other associated symptoms. She went to urgent care center where for evaluation of rectal bleeding, where she had a weak episode and possible near syncopal as episode where she became weak for 30 seconds. She did not fall to the ground and did not lose consciousness  HPI  Past Medical History:  Diagnosis Date  . Bradycardia   . Gout   . Hx of bladder infections   . Hyperlipidemia   . Hypertension   . Migraine   . Thyroid disease   . Vertigo     Patient Active Problem List   Diagnosis Date Noted  . Osteoarthrosis, hand 10/22/2013  . DIZZINESS 02/02/2009  . HYPERCHOLESTEROLEMIA 01/25/2009  . HYPERTENSION 01/25/2009    Past Surgical History:  Procedure Laterality Date  . ABDOMINAL HYSTERECTOMY    . CHOLECYSTECTOMY    . GALLBLADDER SURGERY    . PARATHYROIDECTOMY      OB History    Gravida Para Term Preterm AB Living   4 3 3   1 3    SAB TAB Ectopic Multiple Live Births   1               Home Medications    Prior to Admission medications   Medication Sig Start Date End Date Taking? Authorizing Provider  albuterol (PROVENTIL HFA;VENTOLIN HFA) 108 (90 Base) MCG/ACT inhaler Inhale 2 puffs into the lungs every 6 (six) hours as needed for wheezing or shortness of breath (cough, shortness of breath or wheezing.). 03/28/15   Robyn Haber, MD  amLODipine (NORVASC) 10 MG tablet Take 5 mg by mouth daily.     Historical Provider, MD  aspirin EC 81 MG tablet Take 81 mg by mouth  daily.    Historical Provider, MD  atorvastatin (LIPITOR) 20 MG tablet Take 20 mg by mouth daily.    Historical Provider, MD  hydrochlorothiazide (HYDRODIURIL) 25 MG tablet Take 25 mg by mouth daily.    Historical Provider, MD  meloxicam (MOBIC) 15 MG tablet Take 15 mg by mouth daily.  06/28/13   Historical Provider, MD  potassium chloride (KLOR-CON) 20 MEQ packet Take by mouth daily.    Historical Provider, MD  rosuvastatin (CRESTOR) 10 MG tablet Take 10 mg by mouth daily. Reported on 01/18/2015    Historical Provider, MD  traMADol (ULTRAM) 50 MG tablet Take 1 tablet (50 mg total) by mouth every 8 (eight) hours as needed. 01/18/15   Darlyne Russian, MD    Family History History reviewed. No pertinent family history.  Social History Social History  Substance Use Topics  . Smoking status: Never Smoker  . Smokeless tobacco: Never Used  . Alcohol use 0.6 oz/week    1 Glasses of wine per week     Allergies   Ace inhibitors; Other; and Penicillins   Review of Systems Review of Systems  HENT: Negative.   Respiratory: Negative.   Cardiovascular: Negative.   Gastrointestinal: Positive for blood in stool and constipation.       No longer  feels constipated  Musculoskeletal: Negative.   Skin: Negative.   Neurological: Positive for weakness.  Psychiatric/Behavioral: Negative.   All other systems reviewed and are negative.    Physical Exam Updated Vital Signs BP 133/76 (BP Location: Right Arm)   Pulse 66   Temp 98 F (36.7 C) (Oral)   Resp 20   SpO2 98%   Physical Exam  Constitutional: She is oriented to person, place, and time. She appears well-developed and well-nourished.  HENT:  Head: Normocephalic and atraumatic.  Eyes: Conjunctivae are normal. Pupils are equal, round, and reactive to light.  Neck: Neck supple. No tracheal deviation present. No thyromegaly present.  Cardiovascular: Normal rate and regular rhythm.   No murmur heard. Pulmonary/Chest: Effort normal and breath  sounds normal.  Abdominal: Soft. Bowel sounds are normal. She exhibits no distension. There is no tenderness.  Genitourinary: Rectal exam shows guaiac positive stool.  Genitourinary Comments: Rectal normal tone reddish stool  Musculoskeletal: Normal range of motion. She exhibits no edema or tenderness.  Neurological: She is alert and oriented to person, place, and time. Coordination normal.  , Mildly lightheaded on standing Gait normal  Skin: Skin is warm and dry. No rash noted.  Psychiatric: She has a normal mood and affect.  Nursing note and vitals reviewed.    ED Treatments / Results  Labs (all labs ordered are listed, but only abnormal results are displayed) Labs Reviewed  CBC - Abnormal; Notable for the following:       Result Value   RBC 5.19 (*)    All other components within normal limits  POC OCCULT BLOOD, ED - Abnormal; Notable for the following:    Fecal Occult Bld POSITIVE (*)    All other components within normal limits  COMPREHENSIVE METABOLIC PANEL  TYPE AND SCREEN    EKG  EKG Interpretation  Date/Time:  Friday November 10 2015 19:45:01 EDT Ventricular Rate:  59 PR Interval:    QRS Duration: 91 QT Interval:  371 QTC Calculation: 368 R Axis:   4 Text Interpretation:  Age not entered, assumed to be  80 years old for purpose of ECG interpretation Sinus rhythm Borderline T abnormalities, diffuse leads No significant change since last tracing Confirmed by Winfred Leeds  MD, [place] Reliford [phone]) on 11/10/2015 7:59:24 PM       Radiology No results found.  Procedures Procedures (including critical care time)  Medications Ordered in ED Medications  sodium chloride 0.9 % bolus 1,000 mL (not administered)   Results for orders placed or performed during the hospital encounter of 11/10/15  Comprehensive metabolic panel  Result Value Ref Range   Sodium 137 135 - 145 mmol/L   Potassium 3.7 3.5 - 5.1 mmol/L   Chloride 106 101 - 111 mmol/L   CO2 26 22 - 32 mmol/L    Glucose, Bld 102 (H) 65 - 99 mg/dL   BUN 12 6 - 20 mg/dL   Creatinine, Ser 0.79 0.44 - 1.00 mg/dL   Calcium 9.7 8.9 - 10.3 mg/dL   Total Protein 7.5 6.5 - 8.1 g/dL   Albumin 4.1 3.5 - 5.0 g/dL   AST 31 15 - 41 U/L   ALT 25 14 - 54 U/L   Alkaline Phosphatase 71 38 - 126 U/L   Total Bilirubin 0.9 0.3 - 1.2 mg/dL   GFR calc non Af Amer >60 >60 mL/min   GFR calc Af Amer >60 >60 mL/min   Anion gap 5 5 - 15  CBC  Result Value Ref Range  WBC 7.0 4.0 - 10.5 K/uL   RBC 5.19 (H) 3.87 - 5.11 MIL/uL   Hemoglobin 14.3 12.0 - 15.0 g/dL   HCT 43.4 36.0 - 46.0 %   MCV 83.6 78.0 - 100.0 fL   MCH 27.6 26.0 - 34.0 pg   MCHC 32.9 30.0 - 36.0 g/dL   RDW 15.2 11.5 - 15.5 %   Platelets 232 150 - 400 K/uL  POC occult blood, ED  Result Value Ref Range   Fecal Occult Bld POSITIVE (A) NEGATIVE   No results found.  Initial Impression / Assessment and Plan / ED Course  I have reviewed the triage vital signs and the nursing notes.  Pertinent labs & imaging results that were available during my care of the patient were reviewed by me and considered in my medical decision making (see chart for details).  Clinical Course    9:50 PM patient asymptomatic not lightheaded on standing after treatment with IV fluids and eating a meal. She had reported that she had not eaten since 7:30 AM today. Rectal bleeding may be secondary to traumatic enema. I suggest she follow-up with her gastroenterologist whom she seen in the past year or she can follow-up with Dr.Harwani reviewed hemoglobin is stable Final Clinical Impressions(s) / ED Diagnoses  Diagnoses #1 rectal bleeding #2 near syncope Final diagnoses:  None    New Prescriptions New Prescriptions   No medications on file     Orlie Dakin, MD 11/10/15 2157

## 2015-11-10 NOTE — ED Notes (Signed)
Patient states that she passed out at Urgent Care

## 2015-11-10 NOTE — ED Triage Notes (Signed)
Patient reports this morning she began having rectal bleeding after giving herself an enema.  Last bowel movement today.  Patient seen at urgent care and labs /ekg were performed.

## 2015-11-10 NOTE — Patient Instructions (Addendum)
     IF you received an x-ray today, you will receive an invoice from [facility]. Please contact [facility] at [phone] with questions or concerns regarding your invoice.   IF you received labwork today, you will receive an invoice from [facility]. Please contact [facility] at [phone] with questions or concerns regarding your invoice.   Our billing staff will not be able to assist you with questions regarding bills from these companies.  You will be contacted with the lab results as soon as they are available. The fastest way to get your results is to activate your My Chart account. Instructions are located on the last page of this paperwork. If you have not heard from us regarding the results in 2 weeks, please contact this office.      

## 2015-11-10 NOTE — Progress Notes (Signed)
Patient ID: Kathleen Pacheco, female    DOB: 31-May-1935, 80 y.o.   MRN: [id number]  PCP: Kennyth Arnold, FNP  Chief Complaint  Patient presents with  . Rectal Bleeding    started this morning at 7:00  . Flu Vaccine    Subjective:   HPI 80 year old female presents for evaluation of rectal bleeding since 0300 am today. She was going on vacation and gave herself an enema and started with having rectal bleeding mixed with stool. Later progressed to full blood in toilet. She presents today with acute onset dizziness and just not feeling well. Denies chest pain, headache, or shortness of breath. Denies any prior episodes of rectal bleeding. She had been constipated and inserted an enema just to relieve constipation. She takes aspirin chronically, last dose times one day ago.  Social History   Social History  . Marital status: Divorced    Spouse name: N/A  . Number of children: N/A  . Years of education: N/A   Occupational History  . Not on file.   Social History Main Topics  . Smoking status: Never Smoker  . Smokeless tobacco: Never Used  . Alcohol use 0.6 oz/week    1 Glasses of wine per week  . Drug use: No  . Sexual activity: No     Comment: HYST    Other Topics Concern  . Not on file   Social History Narrative   Diet?      Do you drink/eat things with caffeine? Cup of macho- 1/week      Marital status?                divorced                    What year were you married? 1964      Do you live in a house, apartment, assisted living, condo, trailer, etc.? house      Is it one or more stories? one      How many persons live in your home? 1       Do you have any pets in your home? (please list) no      Current or past profession: Retired Pharmacist, hospital       Do you exercise? Work sometimes                                     Type & how often?      Do you have a living will? no      Do you have a DNR form?   no                               If not, do you want to  discuss one? no      Do you have signed POA/HPOA for forms?       No family history on file.  Review of Systems See HPI  Patient Active Problem List   Diagnosis Date Noted  . Osteoarthrosis, hand 10/22/2013  . DIZZINESS 02/02/2009  . HYPERCHOLESTEROLEMIA 01/25/2009  . HYPERTENSION 01/25/2009     Prior to Admission medications   Medication Sig Start Date End Date Taking? Authorizing Provider  albuterol (PROVENTIL HFA;VENTOLIN HFA) 108 (90 Base) MCG/ACT inhaler Inhale 2 puffs into the lungs every 6 (six) hours as needed for wheezing  or shortness of breath (cough, shortness of breath or wheezing.). 03/28/15  Yes Robyn Haber, MD  amLODipine (NORVASC) 10 MG tablet Take 10 mg by mouth daily.   Yes Historical Provider, MD  aspirin EC 81 MG tablet Take 81 mg by mouth daily.   Yes Historical Provider, MD  hydrochlorothiazide (HYDRODIURIL) 25 MG tablet Take 25 mg by mouth daily.   Yes Historical Provider, MD  rosuvastatin (CRESTOR) 10 MG tablet Take 10 mg by mouth daily. Reported on 01/18/2015   Yes Historical Provider, MD  traMADol (ULTRAM) 50 MG tablet Take 1 tablet (50 mg total) by mouth every 8 (eight) hours as needed. 01/18/15  Yes Darlyne Russian, MD  Colchicine 0.6 MG CAPS TAKE 1 TABLET BY MOUTH TWICE A DAY UNTIL FOOT SEEMS WELL THEN ONCE A DAY FOR 3 MORE DAYS Patient not taking: Reported on 11/10/2015 11/07/14   Harrison Mons, PA-C  meloxicam (MOBIC) 15 MG tablet Take 15 mg by mouth daily.  06/28/13   Historical Provider, MD     Allergies  Allergen Reactions  . Ace Inhibitors   . Other     Artificial sweeteners  . Penicillins     Causes yeast infection       Objective:  Physical Exam  Constitutional: She is oriented to person, place, and time. She appears well-developed and well-nourished.  HENT:  Head: Normocephalic and atraumatic.  Right Ear: External ear normal.  Left Ear: External ear normal.  Nose: Nose normal.  Mouth/Throat: Oropharynx is clear and moist.  Eyes:  Conjunctivae and EOM are normal. Pupils are equal, round, and reactive to light.  Neck: Normal range of motion. Neck supple.  Cardiovascular: Normal rate.   Non-specific changes in T wave and possible chronic ischemia compared with EKG from 2016. QRS changes in leads V2 and AVF.  Pulmonary/Chest: Effort normal and breath sounds normal. No respiratory distress.  Neurological: She is alert and oriented to person, place, and time. Coordination abnormal.  Dizziness, not syncopal  Skin: Skin is warm and dry.  Psychiatric: She has a normal mood and affect. Her behavior is normal. Judgment and thought content normal.   Vitals:   11/10/15 1350  BP: 120/74  Pulse: 74  Resp: 18  Temp: 98.1 F (36.7 C)       Assessment & Plan:  1. Dizziness - EKG 12-Lead - POCT CBC  2. Rectal bleeding - POCT CBC  Patient transported non-emergently to the ED after discussing case with Dr. Lynne Leader. Patient presented to clinic with acute dizziness and lightheaded and required assistance ambulating to the exam table while in exam room. Although EKG showed only minimal changes in comparison to the EKG obtained one year ago, the patient is being referred to the emergency department for further evaluation.   Carroll Sage. Kenton Kingfisher, MSN, FNP-C [facility]

## 2015-11-17 ENCOUNTER — Encounter: Payer: Self-pay | Admitting: Internal Medicine

## 2015-11-17 ENCOUNTER — Ambulatory Visit (INDEPENDENT_AMBULATORY_CARE_PROVIDER_SITE_OTHER): Payer: Medicare Other | Admitting: Internal Medicine

## 2015-11-17 VITALS — BP 142/80 | HR 58 | Temp 97.8°F | Ht 63.0 in | Wt 206.6 lb

## 2015-11-17 DIAGNOSIS — M2550 Pain in unspecified joint: Secondary | ICD-10-CM | POA: Diagnosis not present

## 2015-11-17 DIAGNOSIS — E215 Disorder of parathyroid gland, unspecified: Secondary | ICD-10-CM | POA: Diagnosis not present

## 2015-11-17 DIAGNOSIS — E782 Mixed hyperlipidemia: Secondary | ICD-10-CM | POA: Diagnosis not present

## 2015-11-17 DIAGNOSIS — Z23 Encounter for immunization: Secondary | ICD-10-CM | POA: Diagnosis not present

## 2015-11-17 DIAGNOSIS — I10 Essential (primary) hypertension: Secondary | ICD-10-CM | POA: Diagnosis not present

## 2015-11-17 DIAGNOSIS — J301 Allergic rhinitis due to pollen: Secondary | ICD-10-CM

## 2015-11-17 DIAGNOSIS — R140 Abdominal distension (gaseous): Secondary | ICD-10-CM

## 2015-11-17 LAB — TSH: TSH: 1.58 mIU/L

## 2015-11-17 NOTE — Progress Notes (Signed)
Patient ID: Kathleen Pacheco, female   DOB: 1935-11-15, 80 y.o.   MRN: [id number]    Location:  [facility] Place of Service: OFFICE    Advanced Directive information Does patient have an advance directive?: No, Would patient like information on creating an advanced directive?: Yes - Educational materials given  Chief Complaint  Patient presents with  . Establish Care    New patient to establish care  . Advanced Directive    discuss Advance Directive  . Flu Vaccine    Requested    HPI:  80 yo female seen today as a new pt. She was seen in the ED last week for rectal bleeding. Prior to onset of bleeding, she gave herself an enema and noticed the bleeding. Hgb/Hct 14.3/43.4; Cr 0.79; K+ 3.7; fecal occult blood test (+)  Hx left breast fibroadenoma with calcifications- breast bx in 03/2015 neg for atypical and malignant cells; followed by breast center  HTN - BP stable on amlodipine, HCTZ and K+ supplement. She is on ASA daily  Hyperlipidemia -  Stable on lipitor. She noticed myalgias and stiffness and does not take on a regular basis  Joint pain - stable on , tumeric. She does have pain in right 1st toe and has rec'd injection in past. She would like to be tested for gout  She takes MVI daily  seasonal allergy with occasional wheezing - uses HFA prn. She has not used it in >1 yr. She takes zyrtec   Flatulence - stable on beano; she denies acid reflux sx's  Past Medical History:  Diagnosis Date  . Bradycardia   . Gout   . Hx of bladder infections   . Hyperlipidemia   . Hypertension   . Migraine   . Thyroid disease   . Vertigo     Past Surgical History:  Procedure Laterality Date  . ABDOMINAL HYSTERECTOMY    . CHOLECYSTECTOMY    . GALLBLADDER SURGERY    . PARATHYROIDECTOMY      Patient Care Team: Gildardo Cranker, DO as PCP - General (Internal Medicine) Everett Graff, MD as Consulting Physician (Obstetrics and Gynecology) Charolette Forward, MD as Consulting Physician  (Cardiology)  Social History   Social History  . Marital status: Divorced    Spouse name: N/A  . Number of children: N/A  . Years of education: N/A   Occupational History  . Not on file.   Social History Main Topics  . Smoking status: Never Smoker  . Smokeless tobacco: Never Used  . Alcohol use 0.6 oz/week    1 Glasses of wine per week  . Drug use: No  . Sexual activity: No     Comment: HYST    Other Topics Concern  . Not on file   Social History Narrative   Diet?      Do you drink/eat things with caffeine? Cup of macho- 1/week      Marital status?                divorced                    What year were you married? 1964      Do you live in a house, apartment, assisted living, condo, trailer, etc.? house      Is it one or more stories? one      How many persons live in your home? 1       Do you have any pets in your home? (please  list) no      Current or past profession: Retired Pharmacist, hospital       Do you exercise? Work sometimes                                     Type & how often?      Do you have a living will? no      Do you have a DNR form?   no                               If not, do you want to discuss one? no      Do you have signed POA/HPOA for forms?         reports that she has never smoked. She has never used smokeless tobacco. She reports that she drinks about 0.6 oz of alcohol per week . She reports that she does not use drugs.  History reviewed. No pertinent family history. Family Status  Relation Status  . Mother Deceased  . Father Deceased  . Brother Alive  . Daughter Alive  . Daughter Alive  . Daughter Alive    Immunization History  Administered Date(s) Administered  . Influenza,inj,Quad PF,36+ Mos 10/05/2014  . Pneumococcal Conjugate-13 10/22/2013, 10/05/2014    Allergies  Allergen Reactions  . Ace Inhibitors Other (See Comments)    Reaction:  Unknown   . Other Swelling and Other (See Comments)    Pt states that she is  allergic to artificial sweeteners.   Reaction:  Facial swelling   . Penicillins Other (See Comments)    Reaction:  Yeast infection  Has patient had a PCN reaction causing immediate rash, facial/tongue/throat swelling, SOB or lightheadedness with hypotension: No Has patient had a PCN reaction causing severe rash involving mucus membranes or skin necrosis: No Has patient had a PCN reaction that required hospitalization No Has patient had a PCN reaction occurring within the last 10 years: No If all of the above answers are "NO", then may proceed with Cephalosporin use.    Medications: Patient's Medications  New Prescriptions   No medications on file  Previous Medications   ALBUTEROL (PROVENTIL HFA;VENTOLIN HFA) 108 (90 BASE) MCG/ACT INHALER    Inhale 2 puffs into the lungs every 6 (six) hours as needed for wheezing or shortness of breath.   ALPHA-D-GALACTOSIDASE (BEANO) TABS    Take 2 tablets by mouth as needed (for flatulence).   AMLODIPINE (NORVASC) 5 MG TABLET    Take 5 mg by mouth daily.   ASPIRIN EC 81 MG TABLET    Take 81 mg by mouth daily.   ATORVASTATIN (LIPITOR) 20 MG TABLET    Take 20 mg by mouth 2 (two) times a week.    B COMPLEX-C (B-COMPLEX WITH VITAMIN C) TABLET    Take 1 tablet by mouth daily.   CETIRIZINE (ZYRTEC) 10 MG TABLET    Take 10 mg by mouth at bedtime as needed for allergies.   CHOLECALCIFEROL (EQL VITAMIN D3) 2000 UNITS CAPS    Take 1 capsule by mouth daily.   HYDROCHLOROTHIAZIDE (HYDRODIURIL) 25 MG TABLET    Take 25 mg by mouth daily.   MULTIPLE VITAMIN (MULTI-VITAMIN DAILY PO)    Take 3 tablets by mouth daily.   POTASSIUM CHLORIDE (KLOR-CON) 20 MEQ PACKET    Take 20 mEq by mouth daily.  TRAMADOL (ULTRAM) 50 MG TABLET    Take 50 mg by mouth every 6 (six) hours as needed for moderate pain.   TURMERIC PO    Take by mouth.  Modified Medications   No medications on file  Discontinued Medications   No medications on file    Review of Systems  Constitutional:  Positive for fatigue.       Weight gain  HENT: Positive for dental problem (dentures) and sinus pressure.   Eyes:       Dry  Respiratory: Positive for cough and shortness of breath.   Gastrointestinal: Positive for constipation.  Genitourinary: Positive for frequency.  Musculoskeletal: Positive for back pain.       Joint stiffness  All other systems reviewed and are negative.   Vitals:   11/17/15 1058  BP: (!) 142/80  Pulse: (!) 58  Temp: 97.8 F (36.6 C)  TempSrc: Oral  SpO2: 96%  Weight: 206 lb 9.6 oz (93.7 kg)  Height: '5\' 3"'$  (1.6 m)   Body mass index is 36.6 kg/m.  Physical Exam  Constitutional: She is oriented to person, place, and time. She appears well-developed and well-nourished.  HENT:  Mouth/Throat: Oropharynx is clear and moist. No oropharyngeal exudate.  Eyes: Pupils are equal, round, and reactive to light. No scleral icterus.  Neck: Neck supple. Carotid bruit is not present. No tracheal deviation present. No thyromegaly present.  Cardiovascular: Normal rate, regular rhythm and intact distal pulses.  Exam reveals no gallop and no friction rub.   Murmur (1/6 SEM) heard. No LE edema b/l. no calf TTP.   Pulmonary/Chest: Effort normal and breath sounds normal. No stridor. No respiratory distress. She has no wheezes. She has no rales.  Abdominal: Soft. Bowel sounds are normal. She exhibits distension. She exhibits no mass. There is no hepatomegaly. There is tenderness (epigastric). There is no rebound and no guarding.  Musculoskeletal: She exhibits edema and tenderness (right 1st MTP joint).  (+)bunion in right 1st toe  Lymphadenopathy:    She has no cervical adenopathy.  Neurological: She is alert and oriented to person, place, and time. She has normal reflexes.  Skin: Skin is warm and dry. No rash noted.  Psychiatric: She has a normal mood and affect. Her behavior is normal. Judgment and thought content normal.     Labs reviewed: Admission on 11/10/2015,  Discharged on 11/10/2015  Component Date Value Ref Range Status  . Sodium 11/10/2015 137  135 - 145 mmol/L Final  . Potassium 11/10/2015 3.7  3.5 - 5.1 mmol/L Final  . Chloride 11/10/2015 106  101 - 111 mmol/L Final  . CO2 11/10/2015 26  22 - 32 mmol/L Final  . Glucose, Bld 11/10/2015 102* 65 - 99 mg/dL Final  . BUN 11/10/2015 12  6 - 20 mg/dL Final  . Creatinine, Ser 11/10/2015 0.79  0.44 - 1.00 mg/dL Final  . Calcium 11/10/2015 9.7  8.9 - 10.3 mg/dL Final  . Total Protein 11/10/2015 7.5  6.5 - 8.1 g/dL Final  . Albumin 11/10/2015 4.1  3.5 - 5.0 g/dL Final  . AST 11/10/2015 31  15 - 41 U/L Final  . ALT 11/10/2015 25  14 - 54 U/L Final  . Alkaline Phosphatase 11/10/2015 71  38 - 126 U/L Final  . Total Bilirubin 11/10/2015 0.9  0.3 - 1.2 mg/dL Final  . GFR calc non Af Amer 11/10/2015 >60  >60 mL/min Final  . GFR calc Af Amer 11/10/2015 >60  >60 mL/min Final   Comment: (  NOTE) The eGFR has been calculated using the CKD EPI equation. This calculation has not been validated in all clinical situations. eGFR's persistently <60 mL/min signify possible Chronic Kidney Disease.   . Anion gap 11/10/2015 5  5 - 15 Final  . WBC 11/10/2015 7.0  4.0 - 10.5 K/uL Final  . RBC 11/10/2015 5.19* 3.87 - 5.11 MIL/uL Final  . Hemoglobin 11/10/2015 14.3  12.0 - 15.0 g/dL Final  . HCT 11/10/2015 43.4  36.0 - 46.0 % Final  . MCV 11/10/2015 83.6  78.0 - 100.0 fL Final  . MCH 11/10/2015 27.6  26.0 - 34.0 pg Final  . MCHC 11/10/2015 32.9  30.0 - 36.0 g/dL Final  . RDW 11/10/2015 15.2  11.5 - 15.5 % Final  . Platelets 11/10/2015 232  150 - 400 K/uL Final  . ABO/RH(D) 11/10/2015 A POS   Final  . Antibody Screen 11/10/2015 NEG   Final  . Sample Expiration 11/10/2015 11/13/2015   Final  . Fecal Occult Bld 11/10/2015 POSITIVE* NEGATIVE Final  Office Visit on 11/10/2015  Component Date Value Ref Range Status  . WBC 11/10/2015 4.7  4.6 - 10.2 K/uL Final  . Lymph, poc 11/10/2015 2.5  0.6 - 3.4 Final  . POC LYMPH  PERCENT 11/10/2015 53.1* 10 - 50 %L Final  . MID (cbc) 11/10/2015 0.4  0 - 0.9 Final  . POC MID % 11/10/2015 8.8  0 - 12 %M Final  . POC Granulocyte 11/10/2015 1.8* 2 - 6.9 Final  . Granulocyte percent 11/10/2015 38.1  37 - 80 %G Final  . RBC 11/10/2015 4.76  4.04 - 5.48 M/uL Final  . Hemoglobin 11/10/2015 13.4  12.2 - 16.2 g/dL Final  . HCT, POC 11/10/2015 39.4  37.7 - 47.9 % Final  . MCV 11/10/2015 82.8  80 - 97 fL Final  . MCH, POC 11/10/2015 28.2  27 - 31.2 pg Final  . MCHC 11/10/2015 34.1  31.8 - 35.4 g/dL Final  . RDW, POC 11/10/2015 15.9  % Final  . Platelet Count, POC 11/10/2015 183  142 - 424 K/uL Final  . MPV 11/10/2015 8.4  0 - 99.8 fL Final    No results found.   Assessment/Plan   ICD-9-CM ICD-10-CM   1. Pain in joint involving multiple sites 719.49 M25.50 Uric Acid  2. Essential hypertension 401.9 I10   3. Mixed hyperlipidemia 272.2 E78.2 TSH     Lipid Panel  4. Chronic seasonal allergic rhinitis due to pollen 477.0 J30.1   5. Flatulence/gas pain/belching 787.3 R14.0   6. Parathyroid abnormality (HCC) 252.9 E21.5 PTH, Intact and Calcium  7. Need for immunization against influenza V04.81 Z23 Flu Vaccine QUAD 36+ mos PF IM (Fluarix & Fluzone Quad PF)  8. Need for pneumococcal vaccine V03.82 Z23    Continue current medications as ordered  Will call with lab results  flu shot and pneumovax given today  Please schedule AWV  Follow up in 3 mos for CPE/ECG  Thermon Zulauf S. Perlie Gold  [facility] [address] [place], [place] [address] [phone] Cell (Monday-Friday 8 AM - 5 PM) [phone] After 5 PM and follow prompts

## 2015-11-17 NOTE — Patient Instructions (Addendum)
Continue current medications as ordered  Will call with lab results  flu shot and pneumovax given today  Please schedule AWV  Follow up in 3 mos for CPE/ECG

## 2015-11-18 LAB — LIPID PANEL
Cholesterol: 168 mg/dL (ref 125–200)
HDL: 50 mg/dL (ref >=46)
LDL CALC: 84 mg/dL (ref <130)
Total CHOL/HDL Ratio: 3.4 Ratio (ref <=5.0)
Triglycerides: 169 mg/dL — ABNORMAL HIGH (ref <150)
VLDL: 34 mg/dL — AB (ref <30)

## 2015-11-18 LAB — URIC ACID: URIC ACID, SERUM: 6.3 mg/dL (ref 2.5–7.0)

## 2015-11-20 LAB — PTH, INTACT AND CALCIUM
CALCIUM: 9.4 mg/dL (ref 8.6–10.4)
PTH: 49 pg/mL (ref 14–64)

## 2015-11-29 ENCOUNTER — Encounter: Payer: Self-pay | Admitting: *Deleted

## 2015-12-15 DIAGNOSIS — M9901 Segmental and somatic dysfunction of cervical region: Secondary | ICD-10-CM | POA: Diagnosis not present

## 2015-12-15 DIAGNOSIS — M9902 Segmental and somatic dysfunction of thoracic region: Secondary | ICD-10-CM | POA: Diagnosis not present

## 2015-12-15 DIAGNOSIS — M5030 Other cervical disc degeneration, unspecified cervical region: Secondary | ICD-10-CM | POA: Diagnosis not present

## 2015-12-15 DIAGNOSIS — M9903 Segmental and somatic dysfunction of lumbar region: Secondary | ICD-10-CM | POA: Diagnosis not present

## 2015-12-18 ENCOUNTER — Other Ambulatory Visit: Payer: Self-pay | Admitting: Cardiology

## 2015-12-18 DIAGNOSIS — K625 Hemorrhage of anus and rectum: Secondary | ICD-10-CM | POA: Diagnosis not present

## 2015-12-18 DIAGNOSIS — I10 Essential (primary) hypertension: Secondary | ICD-10-CM | POA: Diagnosis not present

## 2015-12-18 DIAGNOSIS — I25119 Atherosclerotic heart disease of native coronary artery with unspecified angina pectoris: Secondary | ICD-10-CM | POA: Diagnosis not present

## 2015-12-18 DIAGNOSIS — K5900 Constipation, unspecified: Secondary | ICD-10-CM | POA: Diagnosis not present

## 2015-12-18 DIAGNOSIS — E049 Nontoxic goiter, unspecified: Secondary | ICD-10-CM | POA: Diagnosis not present

## 2015-12-18 DIAGNOSIS — R079 Chest pain, unspecified: Secondary | ICD-10-CM

## 2015-12-18 DIAGNOSIS — R0609 Other forms of dyspnea: Secondary | ICD-10-CM | POA: Diagnosis not present

## 2015-12-18 DIAGNOSIS — R140 Abdominal distension (gaseous): Secondary | ICD-10-CM | POA: Diagnosis not present

## 2015-12-18 DIAGNOSIS — I361 Nonrheumatic tricuspid (valve) insufficiency: Secondary | ICD-10-CM | POA: Diagnosis not present

## 2015-12-18 DIAGNOSIS — R7309 Other abnormal glucose: Secondary | ICD-10-CM | POA: Diagnosis not present

## 2015-12-18 DIAGNOSIS — E785 Hyperlipidemia, unspecified: Secondary | ICD-10-CM | POA: Diagnosis not present

## 2015-12-18 DIAGNOSIS — M1990 Unspecified osteoarthritis, unspecified site: Secondary | ICD-10-CM | POA: Diagnosis not present

## 2015-12-26 DIAGNOSIS — Z0100 Encounter for examination of eyes and vision without abnormal findings: Secondary | ICD-10-CM | POA: Diagnosis not present

## 2015-12-26 DIAGNOSIS — Z961 Presence of intraocular lens: Secondary | ICD-10-CM | POA: Diagnosis not present

## 2015-12-27 ENCOUNTER — Encounter (HOSPITAL_COMMUNITY)
Admission: RE | Admit: 2015-12-27 | Discharge: 2015-12-27 | Disposition: A | Discharge status: Home / Self Care | Payer: Medicare Other | Source: Ambulatory Visit | Attending: Cardiology | Admitting: Cardiology

## 2015-12-27 DIAGNOSIS — I10 Essential (primary) hypertension: Secondary | ICD-10-CM | POA: Diagnosis not present

## 2015-12-27 DIAGNOSIS — R079 Chest pain, unspecified: Secondary | ICD-10-CM

## 2015-12-27 DIAGNOSIS — E785 Hyperlipidemia, unspecified: Secondary | ICD-10-CM | POA: Diagnosis not present

## 2015-12-27 DIAGNOSIS — I25119 Atherosclerotic heart disease of native coronary artery with unspecified angina pectoris: Secondary | ICD-10-CM | POA: Diagnosis not present

## 2015-12-27 DIAGNOSIS — R7309 Other abnormal glucose: Secondary | ICD-10-CM | POA: Diagnosis not present

## 2015-12-27 DIAGNOSIS — R9431 Abnormal electrocardiogram [ECG] [EKG]: Secondary | ICD-10-CM | POA: Diagnosis not present

## 2015-12-27 MED ORDER — TECHNETIUM TC 99M TETROFOSMIN IV KIT
30.0000 | PACK | Freq: Once | INTRAVENOUS | Status: AC | PRN
Start: 2015-12-27 — End: 2015-12-27
  Administered 2015-12-27: 30 via INTRAVENOUS

## 2015-12-27 MED ORDER — TECHNETIUM TC 99M TETROFOSMIN IV KIT
10.0000 | PACK | Freq: Once | INTRAVENOUS | Status: AC | PRN
Start: 2015-12-27 — End: 2015-12-27
  Administered 2015-12-27: 10 via INTRAVENOUS

## 2015-12-27 MED ORDER — REGADENOSON 0.4 MG/5ML IV SOLN
0.4000 mg | Freq: Once | INTRAVENOUS | Status: AC
  Administered 2015-12-27: 0.4 mg via INTRAVENOUS

## 2015-12-27 MED ORDER — REGADENOSON 0.4 MG/5ML IV SOLN
INTRAVENOUS | Status: AC
  Administered 2015-12-27: 0.4 mg via INTRAVENOUS
  Filled 2015-12-27: qty 5

## 2015-12-28 DIAGNOSIS — M5030 Other cervical disc degeneration, unspecified cervical region: Secondary | ICD-10-CM | POA: Diagnosis not present

## 2015-12-28 DIAGNOSIS — M9903 Segmental and somatic dysfunction of lumbar region: Secondary | ICD-10-CM | POA: Diagnosis not present

## 2015-12-28 DIAGNOSIS — M9902 Segmental and somatic dysfunction of thoracic region: Secondary | ICD-10-CM | POA: Diagnosis not present

## 2015-12-28 DIAGNOSIS — M9901 Segmental and somatic dysfunction of cervical region: Secondary | ICD-10-CM | POA: Diagnosis not present

## 2016-01-12 ENCOUNTER — Encounter (HOSPITAL_COMMUNITY): Payer: Self-pay | Admitting: *Deleted

## 2016-01-12 ENCOUNTER — Emergency Department (HOSPITAL_COMMUNITY)
Admission: EM | Admit: 2016-01-12 | Discharge: 2016-01-13 | Disposition: A | Discharge status: Home / Self Care | Payer: Medicare Other | Attending: Dermatology | Admitting: Dermatology

## 2016-01-12 DIAGNOSIS — Z7982 Long term (current) use of aspirin: Secondary | ICD-10-CM | POA: Diagnosis not present

## 2016-01-12 DIAGNOSIS — M9901 Segmental and somatic dysfunction of cervical region: Secondary | ICD-10-CM | POA: Diagnosis not present

## 2016-01-12 DIAGNOSIS — M5030 Other cervical disc degeneration, unspecified cervical region: Secondary | ICD-10-CM | POA: Diagnosis not present

## 2016-01-12 DIAGNOSIS — M79605 Pain in left leg: Secondary | ICD-10-CM | POA: Insufficient documentation

## 2016-01-12 DIAGNOSIS — M9902 Segmental and somatic dysfunction of thoracic region: Secondary | ICD-10-CM | POA: Diagnosis not present

## 2016-01-12 DIAGNOSIS — M9903 Segmental and somatic dysfunction of lumbar region: Secondary | ICD-10-CM | POA: Diagnosis not present

## 2016-01-12 DIAGNOSIS — I10 Essential (primary) hypertension: Secondary | ICD-10-CM | POA: Diagnosis not present

## 2016-01-12 DIAGNOSIS — Z5321 Procedure and treatment not carried out due to patient leaving prior to being seen by health care provider: Secondary | ICD-10-CM | POA: Diagnosis not present

## 2016-01-12 NOTE — ED Notes (Signed)
Pt left saying she would just go home.

## 2016-01-12 NOTE — ED Triage Notes (Signed)
Pt complains of left leg pain since yesterday. Pt states she was in bed when the pain started. Pt tried acupuncture today, which she states helped but the pain returned. Pt took ibuprofen 2 hours prior to arrival to ED. Pt denies warmth, swelling, or redness to leg.

## 2016-01-13 ENCOUNTER — Ambulatory Visit (INDEPENDENT_AMBULATORY_CARE_PROVIDER_SITE_OTHER): Payer: Medicare Other | Admitting: Emergency Medicine

## 2016-01-13 VITALS — BP 128/82 | HR 62 | Temp 97.9°F | Resp 16 | Ht 63.0 in | Wt 205.8 lb

## 2016-01-13 DIAGNOSIS — M5442 Lumbago with sciatica, left side: Secondary | ICD-10-CM | POA: Diagnosis not present

## 2016-01-13 DIAGNOSIS — M79605 Pain in left leg: Secondary | ICD-10-CM | POA: Diagnosis not present

## 2016-01-13 MED ORDER — KETOROLAC TROMETHAMINE 60 MG/2ML IM SOLN
60.0000 mg | Freq: Once | INTRAMUSCULAR | Status: AC
  Administered 2016-01-13: 60 mg via INTRAMUSCULAR

## 2016-01-13 MED ORDER — OXYCODONE-ACETAMINOPHEN 5-325 MG PO TABS: 1.0000 | ORAL_TABLET | Freq: Three times a day (TID) | ORAL | 0 refills | Status: DC | PRN

## 2016-01-13 NOTE — Patient Instructions (Addendum)
   IF you received an x-ray today, you will receive an invoice from [facility]. Please contact [facility] at [phone] with questions or concerns regarding your invoice.   IF you received labwork today, you will receive an invoice from [facility]. Please contact [facility] at [phone] with questions or concerns regarding your invoice.   Our billing staff will not be able to assist you with questions regarding bills from these companies.  You will be contacted with the lab results as soon as they are available. The fastest way to get your results is to activate your My Chart account. Instructions are located on the last page of this paperwork. If you have not heard from us regarding the results in 2 weeks, please contact this office.     Sciatica Introduction Sciatica is pain, numbness, weakness, or tingling along your sciatic nerve. The sciatic nerve starts in the lower back and goes down the back of each leg. Sciatica happens when this nerve is pinched or has pressure put on it. Sciatica usually goes away on its own or with treatment. Sometimes, sciatica may keep coming back (recur). Follow these instructions at home: Medicines  Take over-the-counter and prescription medicines only as told by your doctor.  Do not drive or use heavy machinery while taking prescription pain medicine. Managing pain  If directed, put ice on the affected area.  Put ice in a plastic bag.  Place a towel between your skin and the bag.  Leave the ice on for 20 minutes, 2-3 times a day.  After icing, apply heat to the affected area before you exercise or as often as told by your doctor. Use the heat source that your doctor tells you to use, such as a moist heat pack or a heating pad.  Place a towel between your skin and the heat source.  Leave the heat on for 20-30 minutes.  Remove the heat if your skin turns bright red. This is especially important if you are unable to feel  pain, heat, or cold. You may have a greater risk of getting burned. Activity  Return to your normal activities as told by your doctor. Ask your doctor what activities are safe for you.  Avoid activities that make your sciatica worse.  Take short rests during the day. Rest in a lying or standing position. This is usually better than sitting to rest.  When you rest for a long time, do some physical activity or stretching between periods of rest.  Avoid sitting for a long time without moving. Get up and move around at least one time each hour.  Exercise and stretch regularly, as told by your doctor.  Do not lift anything that is heavier than 10 lb (4.5 kg) while you have symptoms of sciatica.  Avoid lifting heavy things even when you do not have symptoms.  Avoid lifting heavy things over and over.  When you lift objects, always lift in a way that is safe for your body. To do this, you should:  Bend your knees.  Keep the object close to your body.  Avoid twisting. General instructions  Use good posture.  Avoid leaning forward when you are sitting.  Avoid hunching over when you are standing.  Stay at a healthy weight.  Wear comfortable shoes that support your feet. Avoid wearing high heels.  Avoid sleeping on a mattress that is too soft or too hard. You might have less pain if you sleep on a mattress that is firm   enough to support your back.  Keep all follow-up visits as told by your doctor. This is important. Contact a doctor if:  You have pain that:  Wakes you up when you are sleeping.  Gets worse when you lie down.  Is worse than the pain you have had in the past.  Lasts longer than 4 weeks.  You lose weight for without trying. Get help right away if:  You cannot control when you pee (urinate) or poop (have a bowel movement).  You have weakness in any of these areas and it gets worse.  Lower back.  Lower belly (pelvis).  Butt (buttocks).  Legs.  You  have redness or swelling of your back.  You have a burning feeling when you pee. This information is not intended to replace advice given to you by your health care provider. Make sure you discuss any questions you have with your health care provider. Document Released: 10/10/2007 Document Revised: 06/08/2015 Document Reviewed: 09/09/2014  2017 [employer]  

## 2016-01-13 NOTE — Progress Notes (Signed)
Kathleen Pacheco 80 y.o.   Chief Complaint  Patient presents with  . Leg Pain    left leg/ sharp pain/ x 3 days    HISTORY OF PRESENT ILLNESS: This is a 80 y.o. female complaining of pain to left low back, hip, and left leg; c/o achiness that travels down the back of her left leg; has had similar pains on th right leg; denies any injury or any other significant symptoms.  Leg Pain   The incident occurred 3 to 5 days ago. Incident location: denies trauma. There was no injury mechanism. The pain is present in the left leg and left hip (left lumbar area). The quality of the pain is described as aching. The pain is at a severity of 8/10. The pain is moderate. The pain has been constant since onset. Associated symptoms include an inability to bear weight. Pertinent negatives include no loss of motion, loss of sensation, muscle weakness, numbness or tingling. Associated symptoms comments: Painful weight bearing. The symptoms are aggravated by movement and weight bearing. Treatments tried: Tramadol. The treatment provided no relief.     Prior to Admission medications   Medication Sig Start Date End Date Taking? Authorizing Provider  albuterol (PROVENTIL HFA;VENTOLIN HFA) 108 (90 Base) MCG/ACT inhaler Inhale 2 puffs into the lungs every 6 (six) hours as needed for wheezing or shortness of breath.   Yes Historical Provider, MD  Alpha-D-Galactosidase Satira Mccallum) TABS Take 2 tablets by mouth as needed (for flatulence).   Yes Historical Provider, MD  amLODipine (NORVASC) 5 MG tablet Take 5 mg by mouth daily.   Yes Historical Provider, MD  aspirin EC 81 MG tablet Take 81 mg by mouth daily.   Yes Historical Provider, MD  atorvastatin (LIPITOR) 20 MG tablet Take 20 mg by mouth 2 (two) times a week.    Yes Historical Provider, MD  B Complex-C (B-COMPLEX WITH VITAMIN C) tablet Take 1 tablet by mouth daily.   Yes Historical Provider, MD  cetirizine (ZYRTEC) 10 MG tablet Take 10 mg by mouth at bedtime as needed  for allergies.   Yes Historical Provider, MD  Cholecalciferol (EQL VITAMIN D3) 2000 units CAPS Take 1 capsule by mouth daily.   Yes Historical Provider, MD  hydrochlorothiazide (HYDRODIURIL) 25 MG tablet Take 25 mg by mouth daily.   Yes Historical Provider, MD  Multiple Vitamin (MULTI-VITAMIN DAILY PO) Take 3 tablets by mouth daily.   Yes Historical Provider, MD  potassium chloride (KLOR-CON) 20 MEQ packet Take 20 mEq by mouth daily.    Yes Historical Provider, MD  traMADol (ULTRAM) 50 MG tablet Take 50 mg by mouth every 6 (six) hours as needed for moderate pain.   Yes Historical Provider, MD  TURMERIC PO Take by mouth.   Yes Historical Provider, MD    Allergies  Allergen Reactions  . Ace Inhibitors Other (See Comments)    Reaction:  Unknown   . Other Swelling and Other (See Comments)    Pt states that she is allergic to artificial sweeteners.   Reaction:  Facial swelling   . Penicillins Other (See Comments)    Reaction:  Yeast infection  Has patient had a PCN reaction causing immediate rash, facial/tongue/throat swelling, SOB or lightheadedness with hypotension: No Has patient had a PCN reaction causing severe rash involving mucus membranes or skin necrosis: No Has patient had a PCN reaction that required hospitalization No Has patient had a PCN reaction occurring within the last 10 years: No If all of the above  answers are "NO", then may proceed with Cephalosporin use.    Patient Active Problem List   Diagnosis Date Noted  . Osteoarthrosis, hand 10/22/2013  . DIZZINESS 02/02/2009  . HYPERCHOLESTEROLEMIA 01/25/2009  . HYPERTENSION 01/25/2009    Past Medical History:  Diagnosis Date  . Bradycardia   . Gout   . Hx of bladder infections   . Hyperlipidemia   . Hypertension   . Migraine   . Thyroid disease   . Vertigo     Past Surgical History:  Procedure Laterality Date  . ABDOMINAL HYSTERECTOMY    . CHOLECYSTECTOMY    . GALLBLADDER SURGERY    . PARATHYROIDECTOMY       Social History   Social History  . Marital status: Divorced    Spouse name: N/A  . Number of children: N/A  . Years of education: N/A   Occupational History  . Not on file.   Social History Main Topics  . Smoking status: Never Smoker  . Smokeless tobacco: Never Used  . Alcohol use 0.6 oz/week    1 Glasses of wine per week  . Drug use: No  . Sexual activity: No     Comment: HYST    Other Topics Concern  . Not on file   Social History Narrative   Diet?      Do you drink/eat things with caffeine? Cup of macho- 1/week      Marital status?                divorced                    What year were you married? 1964      Do you live in a house, apartment, assisted living, condo, trailer, etc.? house      Is it one or more stories? one      How many persons live in your home? 1       Do you have any pets in your home? (please list) no      Current or past profession: Retired Pharmacist, hospital       Do you exercise? Work sometimes                                     Type & how often?      Do you have a living will? no      Do you have a DNR form?   no                               If not, do you want to discuss one? no      Do you have signed POA/HPOA for forms?        No family history on file.   Review of Systems  Constitutional: Negative.   HENT: Negative.   Eyes: Negative.   Respiratory: Negative.   Cardiovascular: Negative.   Gastrointestinal: Negative.   Genitourinary: Negative.   Musculoskeletal: Positive for back pain and joint pain.       Leg pains bilaterally  Skin: Negative.   Neurological: Negative for dizziness, tingling, sensory change, focal weakness and numbness.  Endo/Heme/Allergies: Negative.   Psychiatric/Behavioral: Negative.   All other systems reviewed and are negative.  Vitals:   01/13/16 1141  BP: 128/82  Pulse: 62  Resp: 16  Temp: 97.9  F (36.6 C)     Physical Exam  Constitutional: She is oriented to person, place, and time.  She appears well-developed.  Obese  HENT:  Head: Normocephalic and atraumatic.  Eyes: Conjunctivae and EOM are normal. Pupils are equal, round, and reactive to light.  Neck: Normal range of motion. Neck supple.  Cardiovascular: Normal rate, regular rhythm, normal heart sounds and intact distal pulses.   Pulmonary/Chest: Effort normal and breath sounds normal.  Abdominal: Soft. Bowel sounds are normal. She exhibits no mass.  Musculoskeletal:       Left hip: She exhibits decreased range of motion and tenderness. She exhibits no crepitus and no deformity.       Left knee: Normal.       Left ankle: Normal.       Lumbar back: She exhibits tenderness, pain and spasm.       Back:  LLE:NVI, FROM but c/o pain; no findings suggestive of DVT.  Neurological: She is alert and oriented to person, place, and time.  Vitals reviewed.    ASSESSMENT & PLAN: Kathleen Pacheco was seen today for leg pain.  Diagnoses and all orders for this visit:  Left leg pain -     ketorolac (TORADOL) injection 60 mg; Inject 2 mLs (60 mg total) into the muscle once.  Acute left-sided low back pain with left-sided sciatica  Other orders -     oxyCODONE-acetaminophen (ROXICET) 5-325 MG tablet; Take 1 tablet by mouth every 8 (eight) hours as needed for severe pain.    Patient Instructions       IF you received an x-ray today, you will receive an invoice from [facility]. Please contact [facility] at [phone] with questions or concerns regarding your invoice.   IF you received labwork today, you will receive an invoice from [place]. Please contact [facility] at [phone] with questions or concerns regarding your invoice.   Our billing staff will not be able to assist you with questions regarding bills from these companies.  You will be contacted with the lab results as soon as they are available. The fastest way to get your results is to activate your My Chart account. Instructions are located  on the last page of this paperwork. If you have not heard from [place] regarding the results in 2 weeks, please contact this office.      Sciatica Introduction Sciatica is pain, numbness, weakness, or tingling along your sciatic nerve. The sciatic nerve starts in the lower back and goes down the back of each leg. Sciatica happens when this nerve is pinched or has pressure put on it. Sciatica usually goes away on its own or with treatment. Sometimes, sciatica may keep coming back (recur). Follow these instructions at home: Medicines  Take over-the-counter and prescription medicines only as told by your doctor.  Do not drive or use heavy machinery while taking prescription pain medicine. Managing pain  If directed, put ice on the affected area.  Put ice in a plastic bag.  Place a towel between your skin and the bag.  Leave the ice on for 20 minutes, 2-3 times a day.  After icing, apply heat to the affected area before you exercise or as often as told by your doctor. Use the heat source that your doctor tells you to use, such as a moist heat pack or a heating pad.  Place a towel between your skin and the heat source.  Leave the heat on for 20-30 minutes.  Remove the heat if your  skin turns bright red. This is especially important if you are unable to feel pain, heat, or cold. You may have a greater risk of getting burned. Activity  Return to your normal activities as told by your doctor. Ask your doctor what activities are safe for you.  Avoid activities that make your sciatica worse.  Take short rests during the day. Rest in a lying or standing position. This is usually Pacheco than sitting to rest.  When you rest for a long time, do some physical activity or stretching between periods of rest.  Avoid sitting for a long time without moving. Get up and move around at least one time each hour.  Exercise and stretch regularly, as told by your doctor.  Do not lift anything that is  heavier than 10 lb (4.5 kg) while you have symptoms of sciatica.  Avoid lifting heavy things even when you do not have symptoms.  Avoid lifting heavy things over and over.  When you lift objects, always lift in a way that is safe for your body. To do this, you should:  Bend your knees.  Keep the object close to your body.  Avoid twisting. General instructions  Use good posture.  Avoid leaning forward when you are sitting.  Avoid hunching over when you are standing.  Stay at a healthy weight.  Wear comfortable shoes that support your feet. Avoid wearing high heels.  Avoid sleeping on a mattress that is too soft or too hard. You might have less pain if you sleep on a mattress that is firm enough to support your back.  Keep all follow-up visits as told by your doctor. This is important. Contact a doctor if:  You have pain that:  Wakes you up when you are sleeping.  Gets worse when you lie down.  Is worse than the pain you have had in the past.  Lasts longer than 4 weeks.  You lose weight for without trying. Get help right away if:  You cannot control when you pee (urinate) or poop (have a bowel movement).  You have weakness in any of these areas and it gets worse.  Lower back.  Lower belly (pelvis).  Butt (buttocks).  Legs.  You have redness or swelling of your back.  You have a burning feeling when you pee. This information is not intended to replace advice given to you by your health care provider. Make sure you discuss any questions you have with your health care provider. Document Released: 10/10/2007 Document Revised: 06/08/2015 Document Reviewed: 09/09/2014  2017 Elsevier      Agustina Caroli, MD [facility]

## 2016-01-16 DIAGNOSIS — M7062 Trochanteric bursitis, left hip: Secondary | ICD-10-CM | POA: Diagnosis not present

## 2016-01-16 DIAGNOSIS — M25572 Pain in left ankle and joints of left foot: Secondary | ICD-10-CM | POA: Diagnosis not present

## 2016-01-22 DIAGNOSIS — K5900 Constipation, unspecified: Secondary | ICD-10-CM | POA: Diagnosis not present

## 2016-01-22 DIAGNOSIS — K625 Hemorrhage of anus and rectum: Secondary | ICD-10-CM | POA: Diagnosis not present

## 2016-01-25 DIAGNOSIS — M7062 Trochanteric bursitis, left hip: Secondary | ICD-10-CM | POA: Diagnosis not present

## 2016-01-25 DIAGNOSIS — R262 Difficulty in walking, not elsewhere classified: Secondary | ICD-10-CM | POA: Diagnosis not present

## 2016-02-14 DIAGNOSIS — M9901 Segmental and somatic dysfunction of cervical region: Secondary | ICD-10-CM | POA: Diagnosis not present

## 2016-02-14 DIAGNOSIS — M9903 Segmental and somatic dysfunction of lumbar region: Secondary | ICD-10-CM | POA: Diagnosis not present

## 2016-02-14 DIAGNOSIS — M5030 Other cervical disc degeneration, unspecified cervical region: Secondary | ICD-10-CM | POA: Diagnosis not present

## 2016-02-14 DIAGNOSIS — M9902 Segmental and somatic dysfunction of thoracic region: Secondary | ICD-10-CM | POA: Diagnosis not present

## 2016-02-19 ENCOUNTER — Telehealth: Payer: Self-pay | Admitting: Internal Medicine

## 2016-02-19 NOTE — Telephone Encounter (Signed)
left msg asking pt to confirm this [facility] appt that was rescheduled from 2/19. VDM (DD)

## 2016-02-20 DIAGNOSIS — N3281 Overactive bladder: Secondary | ICD-10-CM | POA: Diagnosis not present

## 2016-02-20 DIAGNOSIS — Z9189 Other specified personal risk factors, not elsewhere classified: Secondary | ICD-10-CM | POA: Diagnosis not present

## 2016-02-22 DIAGNOSIS — M9903 Segmental and somatic dysfunction of lumbar region: Secondary | ICD-10-CM | POA: Diagnosis not present

## 2016-02-22 DIAGNOSIS — M5030 Other cervical disc degeneration, unspecified cervical region: Secondary | ICD-10-CM | POA: Diagnosis not present

## 2016-02-22 DIAGNOSIS — M9902 Segmental and somatic dysfunction of thoracic region: Secondary | ICD-10-CM | POA: Diagnosis not present

## 2016-02-22 DIAGNOSIS — M9901 Segmental and somatic dysfunction of cervical region: Secondary | ICD-10-CM | POA: Diagnosis not present

## 2016-03-04 ENCOUNTER — Ambulatory Visit: Payer: Medicare Other

## 2016-03-05 ENCOUNTER — Ambulatory Visit: Payer: Medicare Other

## 2016-03-08 ENCOUNTER — Ambulatory Visit: Payer: Medicare Other | Admitting: Internal Medicine

## 2016-03-11 ENCOUNTER — Ambulatory Visit (INDEPENDENT_AMBULATORY_CARE_PROVIDER_SITE_OTHER): Payer: Medicare Other | Admitting: Physician Assistant

## 2016-03-11 ENCOUNTER — Encounter: Payer: Self-pay | Admitting: Physician Assistant

## 2016-03-11 VITALS — BP 142/88 | HR 80 | Temp 99.1°F | Resp 18 | Ht 63.0 in | Wt 200.2 lb

## 2016-03-11 DIAGNOSIS — R030 Elevated blood-pressure reading, without diagnosis of hypertension: Secondary | ICD-10-CM

## 2016-03-11 DIAGNOSIS — J029 Acute pharyngitis, unspecified: Secondary | ICD-10-CM

## 2016-03-11 DIAGNOSIS — R05 Cough: Secondary | ICD-10-CM

## 2016-03-11 DIAGNOSIS — R059 Cough, unspecified: Secondary | ICD-10-CM

## 2016-03-11 LAB — POCT INFLUENZA A/B
Influenza A, POC: NEGATIVE
Influenza B, POC: NEGATIVE

## 2016-03-11 MED ORDER — HYDROCODONE-HOMATROPINE 5-1.5 MG/5ML PO SYRP: 5.0000 mL | ORAL_SOLUTION | Freq: Three times a day (TID) | ORAL | 0 refills | Status: DC | PRN

## 2016-03-11 MED ORDER — BENZONATATE 100 MG PO CAPS: 100.0000 mg | ORAL_CAPSULE | Freq: Three times a day (TID) | ORAL | 0 refills | Status: DC | PRN

## 2016-03-11 NOTE — Patient Instructions (Addendum)
Humidifier in your room at night.  Warm tea honey and lemon helps sore throat.  You can also gargle liquid Benadryl.  Please come back in 5-7 days if you are not better.    Viral pharyngitis will usually get better in 3-4 days without the use of medicine.  Follow these instructions at home:  Drink enough water and fluids to keep your urine clear or pale yellow.  Only take over-the-counter or prescription medicines as directed by your health care provider:  If you are prescribed antibiotics, make sure you finish them even if you start to feel better.  Do not take aspirin.  Get lots of rest.  Gargle with 8 oz of salt water ( tsp of salt per 1 qt of water) as often as every 1-2 hours to soothe your throat.  Throat lozenges (if you are not at risk for choking) or sprays may be used to soothe your throat. Get help right away if:  Your neck becomes stiff.  You drool or are unable to swallow liquids.  You vomit or are unable to keep medicines or liquids down.  You have severe pain that does not go away with the use of recommended medicines.  You have trouble breathing (not caused by a stuffy nose). This information is not intended to replace advice given to you by your health care provider. Make sure you discuss any questions you have with your health care provider. Document Released: 12/31/2004 Document Revised: 06/08/2015 Document Reviewed: 09/07/2012 Elsevier Interactive Patient Education  2017 [place] you for coming in today. I hope you feel we met your needs.  Feel free to call [facility] if you have any questions or further requests.  Please consider signing up for MyChart if you do not already have it, as this is a great way to communicate with me.  Best,  Whitney McVey, PA-C  IF you received an x-ray today, you will receive an invoice from [facility]. Please contact [facility] at [phone] with questions or concerns regarding your invoice.    IF you received labwork today, you will receive an invoice from [place]. Please contact [facility] at [phone] with questions or concerns regarding your invoice.   Our billing staff will not be able to assist you with questions regarding bills from these companies.  You will be contacted with the lab results as soon as they are available. The fastest way to get your results is to activate your My Chart account. Instructions are located on the last page of this paperwork. If you have not heard from [place] regarding the results in 2 weeks, please contact this office.

## 2016-03-11 NOTE — Progress Notes (Signed)
Kathleen Pacheco  MRN: [id number] DOB: [birth year]  PCP: Gildardo Cranker, DO  Subjective:  Pt is a 81 year old female PMH HTN, HLD who presents to clinic for cough and sore throat.  Symptoms started two nights ago with sore throat, 7/10 pain. Cough is dry, present all day. +mild headache yesterday. +fatigue. She is eating soup.She is staying well hydrated.  She has tried salt water gargles, helped some. No known sick contacts, however she recently returned from a cruise. She has had her flu shot this year.  Denies fever, chills, muscle aches, sinus pressure/pain, nausea, vomiting.  Review of Systems  Constitutional: Positive for fatigue. Negative for chills, diaphoresis and fever.  HENT: Positive for sore throat. Negative for congestion, postnasal drip, rhinorrhea, sinus pressure and sneezing.   Respiratory: Positive for cough. Negative for chest tightness, shortness of breath and wheezing.   Cardiovascular: Negative for chest pain and palpitations.  Gastrointestinal: Negative for abdominal pain, diarrhea, nausea and vomiting.  Neurological: Positive for headaches. Negative for weakness and light-headedness.  Psychiatric/Behavioral: Negative for sleep disturbance.    Patient Active Problem List   Diagnosis Date Noted  . Osteoarthrosis, hand 10/22/2013  . DIZZINESS 02/02/2009  . HYPERCHOLESTEROLEMIA 01/25/2009  . HYPERTENSION 01/25/2009    Current Outpatient Prescriptions on File Prior to Visit  Medication Sig Dispense Refill  . albuterol (PROVENTIL HFA;VENTOLIN HFA) 108 (90 Base) MCG/ACT inhaler Inhale 2 puffs into the lungs every 6 (six) hours as needed for wheezing or shortness of breath.    . Alpha-D-Galactosidase (BEANO) TABS Take 2 tablets by mouth as needed (for flatulence).    Marland Kitchen amLODipine (NORVASC) 5 MG tablet Take 5 mg by mouth daily.    Marland Kitchen aspirin EC 81 MG tablet Take 81 mg by mouth daily.    . B Complex-C (B-COMPLEX WITH VITAMIN C) tablet Take 1 tablet by mouth daily.      . cetirizine (ZYRTEC) 10 MG tablet Take 10 mg by mouth at bedtime as needed for allergies.    . Multiple Vitamin (MULTI-VITAMIN DAILY PO) Take 3 tablets by mouth daily.    . potassium chloride (KLOR-CON) 20 MEQ packet Take 20 mEq by mouth daily.     . traMADol (ULTRAM) 50 MG tablet Take 50 mg by mouth every 6 (six) hours as needed for moderate pain.    . TURMERIC PO Take by mouth.    Marland Kitchen atorvastatin (LIPITOR) 20 MG tablet Take 20 mg by mouth 2 (two) times a week.     . Cholecalciferol (EQL VITAMIN D3) 2000 units CAPS Take 1 capsule by mouth daily.    . hydrochlorothiazide (HYDRODIURIL) 25 MG tablet Take 25 mg by mouth daily.    Marland Kitchen oxyCODONE-acetaminophen (ROXICET) 5-325 MG tablet Take 1 tablet by mouth every 8 (eight) hours as needed for severe pain. (Patient not taking: Reported on 03/11/2016) 20 tablet 0   No current facility-administered medications on file prior to visit.     Allergies  Allergen Reactions  . Ace Inhibitors Other (See Comments)    Reaction:  Unknown   . Other Swelling and Other (See Comments)    Pt states that she is allergic to artificial sweeteners.   Reaction:  Facial swelling   . Penicillins Other (See Comments)    Reaction:  Yeast infection  Has patient had a PCN reaction causing immediate rash, facial/tongue/throat swelling, SOB or lightheadedness with hypotension: No Has patient had a PCN reaction causing severe rash involving mucus membranes or skin necrosis: No Has patient  had a PCN reaction that required hospitalization No Has patient had a PCN reaction occurring within the last 10 years: No If all of the above answers are "NO", then may proceed with Cephalosporin use.     Objective:  BP (!) 142/84   Pulse 80   Temp 99.1 F (37.3 C) (Oral)   Resp 18   Ht 5\' 3"  (1.6 m)   Wt 200 lb 3.2 oz (90.8 kg)   SpO2 96%   BMI 35.46 kg/m   Physical Exam  Constitutional: She is oriented to person, place, and time and well-developed, well-nourished, and in no  distress. No distress.  HENT:  Mouth/Throat: Mucous membranes are normal. Posterior oropharyngeal erythema present. No oropharyngeal exudate or posterior oropharyngeal edema.  Cardiovascular: Normal rate, regular rhythm and normal heart sounds.   Pulmonary/Chest: Effort normal. She has no decreased breath sounds. She has no wheezes. She has no rhonchi. She has no rales.  Neurological: She is alert and oriented to person, place, and time. GCS score is 15.  Skin: Skin is warm and dry.  Psychiatric: Mood, memory, affect and judgment normal.  Vitals reviewed.  Results for orders placed or performed in visit on 03/11/16  POCT Influenza A/B  Result Value Ref Range   Influenza A, POC Negative Negative   Influenza B, POC Negative Negative    Assessment and Plan :  1. Acute pharyngitis, unspecified etiology 2. Cough 3. Sore throat - benzonatate (TESSALON) 100 MG capsule; Take 1-2 capsules (100-200 mg total) by mouth 3 (three) times daily as needed for cough.  Dispense: 40 capsule; Refill: 0 - HYDROcodone-homatropine (HYCODAN) 5-1.5 MG/5ML syrup; Take 5 mLs by mouth every 8 (eight) hours as needed for cough.  Dispense: 120 mL; Refill: 0 - POCT Influenza A/B - Suspect viral illness, negative for flu. Will treat supportively. Discussed results with pt. RTC in 5-7 days if no improvement. She understands and agrees with plan.   4. Elevated blood pressure reading - Recheck vitals   Mercer Pod, PA-C  [facility] [place] 03/11/2016 11:26 AM

## 2016-03-18 ENCOUNTER — Ambulatory Visit (INDEPENDENT_AMBULATORY_CARE_PROVIDER_SITE_OTHER): Payer: Medicare Other

## 2016-03-18 ENCOUNTER — Ambulatory Visit (INDEPENDENT_AMBULATORY_CARE_PROVIDER_SITE_OTHER): Payer: Medicare Other | Admitting: Emergency Medicine

## 2016-03-18 VITALS — BP 116/74 | HR 68 | Temp 98.2°F | Resp 18 | Ht 63.0 in | Wt 200.0 lb

## 2016-03-18 DIAGNOSIS — R059 Cough, unspecified: Secondary | ICD-10-CM

## 2016-03-18 DIAGNOSIS — R05 Cough: Secondary | ICD-10-CM | POA: Diagnosis not present

## 2016-03-18 DIAGNOSIS — J209 Acute bronchitis, unspecified: Secondary | ICD-10-CM | POA: Diagnosis not present

## 2016-03-18 MED ORDER — PROMETHAZINE-CODEINE 6.25-10 MG/5ML PO SYRP: 5.0000 mL | ORAL_SOLUTION | Freq: Every evening | ORAL | 0 refills | Status: DC | PRN

## 2016-03-18 MED ORDER — AZITHROMYCIN 250 MG PO TABS: ORAL_TABLET | ORAL | 0 refills | Status: DC

## 2016-03-18 NOTE — Patient Instructions (Addendum)
     IF you received an x-ray today, you will receive an invoice from [facility]. Please contact [facility] at [phone] with questions or concerns regarding your invoice.   IF you received labwork today, you will receive an invoice from [facility]. Please contact [facility] at [phone] with questions or concerns regarding your invoice.   Our billing staff will not be able to assist you with questions regarding bills from these companies.  You will be contacted with the lab results as soon as they are available. The fastest way to get your results is to activate your My Chart account. Instructions are located on the last page of this paperwork. If you have not heard from us regarding the results in 2 weeks, please contact this office.      Acute Bronchitis, Adult Acute bronchitis is when air tubes (bronchi) in the lungs suddenly get swollen. The condition can make it hard to breathe. It can also cause these symptoms:  A cough.  Coughing up clear, yellow, or green mucus.  Wheezing.  Chest congestion.  Shortness of breath.  A fever.  Body aches.  Chills.  A sore throat.  Follow these instructions at home: Medicines  Take over-the-counter and prescription medicines only as told by your doctor.  If you were prescribed an antibiotic medicine, take it as told by your doctor. Do not stop taking the antibiotic even if you start to feel better. General instructions  Rest.  Drink enough fluids to keep your pee (urine) clear or pale yellow.  Avoid smoking and secondhand smoke. If you smoke and you need help quitting, ask your doctor. Quitting will help your lungs heal faster.  Use an inhaler, cool mist vaporizer, or humidifier as told by your doctor.  Keep all follow-up visits as told by your doctor. This is important. How is this prevented? To lower your risk of getting this condition again:  Wash your hands often with soap and water. If you cannot  use soap and water, use hand sanitizer.  Avoid contact with people who have cold symptoms.  Try not to touch your hands to your mouth, nose, or eyes.  Make sure to get the flu shot every year.  Contact a doctor if:  Your symptoms do not get better in 2 weeks. Get help right away if:  You cough up blood.  You have chest pain.  You have very bad shortness of breath.  You become dehydrated.  You faint (pass out) or keep feeling like you are going to pass out.  You keep throwing up (vomiting).  You have a very bad headache.  Your fever or chills gets worse. This information is not intended to replace advice given to you by your health care provider. Make sure you discuss any questions you have with your health care provider. Document Released: 06/19/2007 Document Revised: 08/09/2015 Document Reviewed: 06/21/2015 Elsevier Interactive Patient Education  2017 [employer]vier Inc.  

## 2016-03-18 NOTE — Progress Notes (Signed)
Kathleen Pacheco 81 y.o.   Chief Complaint  Patient presents with  . Follow-up    HISTORY OF PRESENT ILLNESS: This is a 81 y.o. female complaining of persistent cough productive of yellowish phlegm x 2 weeks. Recently seen here and started on cough medication that is not helping.  Cough  This is a new problem. The current episode started 1 to 4 weeks ago. The problem has been gradually worsening. The problem occurs constantly. The cough is productive of sputum. Pertinent negatives include no chest pain, chills, eye redness, fever, headaches, heartburn, hemoptysis, myalgias, nasal congestion, rash, sore throat, shortness of breath, weight loss or wheezing. She has tried prescription cough suppressant for the symptoms. The treatment provided no relief. There is no history of asthma, COPD, emphysema or pneumonia.     Prior to Admission medications   Medication Sig Start Date End Date Taking? Authorizing Provider  albuterol (PROVENTIL HFA;VENTOLIN HFA) 108 (90 Base) MCG/ACT inhaler Inhale 2 puffs into the lungs every 6 (six) hours as needed for wheezing or shortness of breath.   Yes Historical Provider, MD  Alpha-D-Galactosidase Satira Mccallum) TABS Take 2 tablets by mouth as needed (for flatulence).   Yes Historical Provider, MD  amLODipine (NORVASC) 5 MG tablet Take 5 mg by mouth daily.   Yes Historical Provider, MD  aspirin EC 81 MG tablet Take 81 mg by mouth daily.   Yes Historical Provider, MD  atorvastatin (LIPITOR) 20 MG tablet Take 20 mg by mouth 2 (two) times a week.    Yes Historical Provider, MD  B Complex-C (B-COMPLEX WITH VITAMIN C) tablet Take 1 tablet by mouth daily.   Yes Historical Provider, MD  benzonatate (TESSALON) 100 MG capsule Take 1-2 capsules (100-200 mg total) by mouth 3 (three) times daily as needed for cough. 03/11/16  Yes Elizabeth Whitney McVey, PA-C  cetirizine (ZYRTEC) 10 MG tablet Take 10 mg by mouth at bedtime as needed for allergies.   Yes Historical Provider, MD    Cholecalciferol (EQL VITAMIN D3) 2000 units CAPS Take 1 capsule by mouth daily.   Yes Historical Provider, MD  hydrochlorothiazide (HYDRODIURIL) 25 MG tablet Take 25 mg by mouth daily.   Yes Historical Provider, MD  HYDROcodone-homatropine (HYCODAN) 5-1.5 MG/5ML syrup Take 5 mLs by mouth every 8 (eight) hours as needed for cough. 03/11/16  Yes Elizabeth Whitney McVey, PA-C  Multiple Vitamin (MULTI-VITAMIN DAILY PO) Take 3 tablets by mouth daily.   Yes Historical Provider, MD  oxyCODONE-acetaminophen (ROXICET) 5-325 MG tablet Take 1 tablet by mouth every 8 (eight) hours as needed for severe pain. 01/13/16  Yes [facility], MD  potassium chloride (KLOR-CON) 20 MEQ packet Take 20 mEq by mouth daily.    Yes Historical Provider, MD  traMADol (ULTRAM) 50 MG tablet Take 50 mg by mouth every 6 (six) hours as needed for moderate pain.   Yes Historical Provider, MD  TURMERIC PO Take by mouth.   Yes Historical Provider, MD    Allergies  Allergen Reactions  . Ace Inhibitors Other (See Comments)    Reaction:  Unknown   . Other Swelling and Other (See Comments)    Pt states that she is allergic to artificial sweeteners.   Reaction:  Facial swelling   . Penicillins Other (See Comments)    Reaction:  Yeast infection  Has patient had a PCN reaction causing immediate rash, facial/tongue/throat swelling, SOB or lightheadedness with hypotension: No Has patient had a PCN reaction causing severe rash involving mucus membranes or skin  necrosis: No Has patient had a PCN reaction that required hospitalization No Has patient had a PCN reaction occurring within the last 10 years: No If all of the above answers are "NO", then may proceed with Cephalosporin use.    Patient Active Problem List   Diagnosis Date Noted  . Osteoarthrosis, hand 10/22/2013  . DIZZINESS 02/02/2009  . HYPERCHOLESTEROLEMIA 01/25/2009  . HYPERTENSION 01/25/2009    Past Medical History:  Diagnosis Date  . Bradycardia   . Gout    . Hx of bladder infections   . Hyperlipidemia   . Hypertension   . Migraine   . Thyroid disease   . Vertigo     Past Surgical History:  Procedure Laterality Date  . ABDOMINAL HYSTERECTOMY    . CHOLECYSTECTOMY    . GALLBLADDER SURGERY    . PARATHYROIDECTOMY      Social History   Social History  . Marital status: Divorced    Spouse name: N/A  . Number of children: N/A  . Years of education: N/A   Occupational History  . Not on file.   Social History Main Topics  . Smoking status: Never Smoker  . Smokeless tobacco: Never Used  . Alcohol use 0.6 oz/week    1 Glasses of wine per week  . Drug use: No  . Sexual activity: No     Comment: HYST    Other Topics Concern  . Not on file   Social History Narrative   Diet?      Do you drink/eat things with caffeine? Cup of macho- 1/week      Marital status?                divorced                    What year were you married? 1964      Do you live in a house, apartment, assisted living, condo, trailer, etc.? house      Is it one or more stories? one      How many persons live in your home? 1       Do you have any pets in your home? (please list) no      Current or past profession: Retired Pharmacist, hospital       Do you exercise? Work sometimes                                     Type & how often?      Do you have a living will? no      Do you have a DNR form?   no                               If not, do you want to discuss one? no      Do you have signed POA/HPOA for forms?        History reviewed. No pertinent family history.   Review of Systems  Constitutional: Negative for chills, fever and weight loss.  HENT: Negative for congestion, nosebleeds, sinus pain and sore throat.   Eyes: Negative for discharge and redness.  Respiratory: Positive for cough. Negative for hemoptysis, shortness of breath and wheezing.   Cardiovascular: Negative for chest pain and palpitations.  Gastrointestinal: Negative for abdominal  pain, heartburn, nausea and vomiting.  Genitourinary: Negative for  dysuria and hematuria.  Musculoskeletal: Negative for back pain, myalgias and neck pain.  Skin: Negative for rash.  Neurological: Negative for dizziness and headaches.  Endo/Heme/Allergies: Does not bruise/bleed easily.  All other systems reviewed and are negative.  Vitals:   03/18/16 1049  BP: 116/74  Pulse: 68  Resp: 18  Temp: 98.2 F (36.8 C)     Physical Exam  Constitutional: She is oriented to person, place, and time. She appears well-developed and well-nourished.  HENT:  Head: Normocephalic and atraumatic.  Nose: Nose normal.  Mouth/Throat: Oropharynx is clear and moist. No oropharyngeal exudate.  Eyes: Conjunctivae and EOM are normal. Pupils are equal, round, and reactive to light.  Neck: Normal range of motion. Neck supple. No JVD present. No thyromegaly present.  Cardiovascular: Normal rate, regular rhythm and normal heart sounds.   Pulmonary/Chest: Effort normal and breath sounds normal.  Abdominal: Soft. She exhibits no distension. There is no tenderness.  Musculoskeletal: Normal range of motion.  Lymphadenopathy:    She has no cervical adenopathy.  Neurological: She is alert and oriented to person, place, and time. No sensory deficit. She exhibits normal muscle tone.  Skin: Skin is warm and dry. Capillary refill takes less than 2 seconds.  Psychiatric: She has a normal mood and affect. Her behavior is normal.  Vitals reviewed.  CXR: NAD; no infiltrates.  ASSESSMENT & PLAN: Mika was seen today for follow-up.  Diagnoses and all orders for this visit:  Acute bronchitis, unspecified organism  Cough -     DG Chest 2 View; Future  Other orders -     promethazine-codeine (PHENERGAN WITH CODEINE) 6.25-10 MG/5ML syrup; Take 5 mLs by mouth at bedtime as needed for cough. -     azithromycin (ZITHROMAX) 250 MG tablet; Sig as indicated    Patient Instructions       IF you received an x-ray  today, you will receive an invoice from [facility]. Please contact [facility] at [phone] with questions or concerns regarding your invoice.   IF you received labwork today, you will receive an invoice from [facility]. Please contact [facility] at [phone] with questions or concerns regarding your invoice.   Our billing staff will not be able to assist you with questions regarding bills from these companies.  You will be contacted with the lab results as soon as they are available. The fastest way to get your results is to activate your My Chart account. Instructions are located on the last page of this paperwork. If you have not heard from [place] regarding the results in 2 weeks, please contact this office.     Acute Bronchitis, Adult Acute bronchitis is when air tubes (bronchi) in the lungs suddenly get swollen. The condition can make it hard to breathe. It can also cause these symptoms:  A cough.  Coughing up clear, yellow, or green mucus.  Wheezing.  Chest congestion.  Shortness of breath.  A fever.  Body aches.  Chills.  A sore throat. Follow these instructions at home: Medicines   Take over-the-counter and prescription medicines only as told by your doctor.  If you were prescribed an antibiotic medicine, take it as told by your doctor. Do not stop taking the antibiotic even if you start to feel better. General instructions   Rest.  Drink enough fluids to keep your pee (urine) clear or pale yellow.  Avoid smoking and secondhand smoke. If you smoke and you need help quitting, ask your doctor. Quitting will help your lungs heal  faster.  Use an inhaler, cool mist vaporizer, or humidifier as told by your doctor.  Keep all follow-up visits as told by your doctor. This is important. How is this prevented? To lower your risk of getting this condition again:  Wash your hands often with soap and water. If you cannot use soap and water, use hand  sanitizer.  Avoid contact with people who have cold symptoms.  Try not to touch your hands to your mouth, nose, or eyes.  Make sure to get the flu shot every year. Contact a doctor if:  Your symptoms do not get better in 2 weeks. Get help right away if:  You cough up blood.  You have chest pain.  You have very bad shortness of breath.  You become dehydrated.  You faint (pass out) or keep feeling like you are going to pass out.  You keep throwing up (vomiting).  You have a very bad headache.  Your fever or chills gets worse. This information is not intended to replace advice given to you by your health care provider. Make sure you discuss any questions you have with your health care provider. Document Released: 06/19/2007 Document Revised: 08/09/2015 Document Reviewed: 06/21/2015 Elsevier Interactive Patient Education  2017 Elsevier Inc.      Agustina Caroli, MD [facility]

## 2016-03-21 ENCOUNTER — Other Ambulatory Visit: Payer: Self-pay | Admitting: Emergency Medicine

## 2016-03-21 NOTE — Telephone Encounter (Signed)
03/11/2016  last ov and refill

## 2016-03-22 NOTE — Telephone Encounter (Signed)
Please call and ask pt if she is out of this medication. Dr. Mitchel Honour refilled this for her 4 days ago (on 3/5). She should not be out

## 2016-03-27 DIAGNOSIS — Z1231 Encounter for screening mammogram for malignant neoplasm of breast: Secondary | ICD-10-CM | POA: Diagnosis not present

## 2016-03-27 DIAGNOSIS — Z139 Encounter for screening, unspecified: Secondary | ICD-10-CM | POA: Diagnosis not present

## 2016-03-27 DIAGNOSIS — Z01419 Encounter for gynecological examination (general) (routine) without abnormal findings: Secondary | ICD-10-CM | POA: Diagnosis not present

## 2016-03-27 DIAGNOSIS — M8580 Other specified disorders of bone density and structure, unspecified site: Secondary | ICD-10-CM | POA: Diagnosis not present

## 2016-03-27 DIAGNOSIS — E669 Obesity, unspecified: Secondary | ICD-10-CM | POA: Diagnosis not present

## 2016-03-27 DIAGNOSIS — M8588 Other specified disorders of bone density and structure, other site: Secondary | ICD-10-CM | POA: Diagnosis not present

## 2016-04-05 ENCOUNTER — Ambulatory Visit (INDEPENDENT_AMBULATORY_CARE_PROVIDER_SITE_OTHER): Payer: Medicare Other | Admitting: Family Medicine

## 2016-04-05 VITALS — BP 110/70 | HR 68 | Resp 17 | Ht 63.0 in | Wt 201.0 lb

## 2016-04-05 DIAGNOSIS — R059 Cough, unspecified: Secondary | ICD-10-CM

## 2016-04-05 DIAGNOSIS — I10 Essential (primary) hypertension: Secondary | ICD-10-CM

## 2016-04-05 DIAGNOSIS — R05 Cough: Secondary | ICD-10-CM

## 2016-04-05 DIAGNOSIS — J9801 Acute bronchospasm: Secondary | ICD-10-CM | POA: Diagnosis not present

## 2016-04-05 MED ORDER — HYDROCODONE-HOMATROPINE 5-1.5 MG/5ML PO SYRP: 5.0000 mL | ORAL_SOLUTION | Freq: Three times a day (TID) | ORAL | 0 refills | Status: DC | PRN

## 2016-04-05 MED ORDER — ALBUTEROL SULFATE HFA 108 (90 BASE) MCG/ACT IN AERS: 2.0000 | INHALATION_SPRAY | Freq: Four times a day (QID) | RESPIRATORY_TRACT | 1 refills | Status: DC | PRN

## 2016-04-05 MED ORDER — RANITIDINE HCL 150 MG PO TABS: 150.0000 mg | ORAL_TABLET | Freq: Two times a day (BID) | ORAL | 0 refills | Status: DC

## 2016-04-05 MED ORDER — BENZONATATE 100 MG PO CAPS: 100.0000 mg | ORAL_CAPSULE | Freq: Three times a day (TID) | ORAL | 0 refills | Status: DC | PRN

## 2016-04-05 NOTE — Progress Notes (Signed)
Chief Complaint  Patient presents with  . Cough    onset 1-2 months    HPI   Cough and Bronchospasm Pt reports that she has been coughing for 1-2 months She reports that she gets a strangling kind of cough that is worse at night She uses a humidifier  Her cough is nonproductive She has to clear her throat very often She reports that rarely she coughs up yellow mucus She denies chest pains She wheezes sometimes   Hypertension: Patient here for follow-up of elevated blood pressure. She is not exercising and is adherent to low salt diet.  Blood pressure is well controlled at home. Cardiac symptoms none. Patient denies chest pain, dyspnea, fatigue and irregular heart beat.  Cardiovascular risk factors: advanced age (older than 42 for men, 79 for women) and hypertension. Use of agents associated with hypertension: none. History of target organ damage: none.  She did not take any bp meds today. BP Readings from Last 3 Encounters:  04/05/16 110/70  03/18/16 116/74  03/11/16 (!) 142/88      Past Medical History:  Diagnosis Date  . Bradycardia   . Gout   . Hx of bladder infections   . Hyperlipidemia   . Hypertension   . Migraine   . Thyroid disease   . Vertigo     Current Outpatient Prescriptions  Medication Sig Dispense Refill  . albuterol (PROVENTIL HFA;VENTOLIN HFA) 108 (90 Base) MCG/ACT inhaler Inhale 2 puffs into the lungs every 6 (six) hours as needed for wheezing or shortness of breath. 18 g 1  . Alpha-D-Galactosidase (BEANO) TABS Take 2 tablets by mouth as needed (for flatulence).    Marland Kitchen aspirin EC 81 MG tablet Take 81 mg by mouth daily.    Marland Kitchen atorvastatin (LIPITOR) 20 MG tablet Take 20 mg by mouth 2 (two) times a week.     . B Complex-C (B-COMPLEX WITH VITAMIN C) tablet Take 1 tablet by mouth daily.    . benzonatate (TESSALON) 100 MG capsule Take 1-2 capsules (100-200 mg total) by mouth 3 (three) times daily as needed for cough. 40 capsule 0  . cetirizine (ZYRTEC) 10 MG  tablet Take 10 mg by mouth at bedtime as needed for allergies.    . Cholecalciferol (EQL VITAMIN D3) 2000 units CAPS Take 1 capsule by mouth daily.    . hydrochlorothiazide (HYDRODIURIL) 25 MG tablet Take 25 mg by mouth daily.    Marland Kitchen HYDROcodone-homatropine (HYCODAN) 5-1.5 MG/5ML syrup Take 5 mLs by mouth every 8 (eight) hours as needed for cough. 120 mL 0  . Multiple Vitamin (MULTI-VITAMIN DAILY PO) Take 3 tablets by mouth daily.    Marland Kitchen oxyCODONE-acetaminophen (ROXICET) 5-325 MG tablet Take 1 tablet by mouth every 8 (eight) hours as needed for severe pain. 20 tablet 0  . potassium chloride (KLOR-CON) 20 MEQ packet Take 20 mEq by mouth daily.     Marland Kitchen tolterodine (DETROL LA) 4 MG 24 hr capsule TAKE 1 CAPSULE(S) EVERY DAY BY ORAL ROUTE.  1  . traMADol (ULTRAM) 50 MG tablet Take 50 mg by mouth every 6 (six) hours as needed for moderate pain.    . TURMERIC PO Take by mouth.    . ranitidine (ZANTAC) 150 MG tablet Take 1 tablet (150 mg total) by mouth 2 (two) times daily. 60 tablet 0   No current facility-administered medications for this visit.     Allergies:  Allergies  Allergen Reactions  . Ace Inhibitors Other (See Comments)    Reaction:  Unknown   . Other Swelling and Other (See Comments)    Pt states that she is allergic to artificial sweeteners.   Reaction:  Facial swelling   . Penicillins Other (See Comments)    Reaction:  Yeast infection  Has patient had a PCN reaction causing immediate rash, facial/tongue/throat swelling, SOB or lightheadedness with hypotension: No Has patient had a PCN reaction causing severe rash involving mucus membranes or skin necrosis: No Has patient had a PCN reaction that required hospitalization No Has patient had a PCN reaction occurring within the last 10 years: No If all of the above answers are "NO", then may proceed with Cephalosporin use.    Past Surgical History:  Procedure Laterality Date  . ABDOMINAL HYSTERECTOMY    . CHOLECYSTECTOMY    .  GALLBLADDER SURGERY    . PARATHYROIDECTOMY      Social History   Social History  . Marital status: Divorced    Spouse name: N/A  . Number of children: N/A  . Years of education: N/A   Social History Main Topics  . Smoking status: Never Smoker  . Smokeless tobacco: Never Used  . Alcohol use 0.6 oz/week    1 Glasses of wine per week  . Drug use: No  . Sexual activity: No     Comment: HYST    Other Topics Concern  . None   Social History Narrative   Diet?      Do you drink/eat things with caffeine? Cup of macho- 1/week      Marital status?                divorced                    What year were you married? 1964      Do you live in a house, apartment, assisted living, condo, trailer, etc.? house      Is it one or more stories? one      How many persons live in your home? 1       Do you have any pets in your home? (please list) no      Current or past profession: Retired Pharmacist, hospital       Do you exercise? Work sometimes                                     Type & how often?      Do you have a living will? no      Do you have a DNR form?   no                               If not, do you want to discuss one? no      Do you have signed POA/HPOA for forms?        ROS  Objective: Vitals:   04/05/16 1611  BP: 110/70  Pulse: 68  Resp: 17  SpO2: 96%  Weight: 201 lb (91.2 kg)  Height: 5\' 3"  (1.6 m)   BP Readings from Last 3 Encounters:  04/05/16 110/70  03/18/16 116/74  03/11/16 (!) 142/88    Physical Exam  General: alert, oriented, in NAD Head: normocephalic, atraumatic, no sinus tenderness Eyes: EOM intact, no scleral icterus or conjunctival injection Ears: TM clear bilaterally Nose: mucosa nonerythematous, nonedematous Throat: no pharyngeal  exudate or erythema Lymph: no posterior auricular, submental or cervical lymph adenopathy Heart: normal rate, normal sinus rhythm, no murmurs Lungs: audible end expiratory wheeze heard in the RML cleared up after  cough and became clear to auscultation bilaterally, no wheezing   EXAM: CHEST  2 VIEW  COMPARISON:  10/19/2014  FINDINGS: Heart and mediastinal contours are within normal limits. No focal opacities or effusions. No acute bony abnormality.  IMPRESSION: No active cardiopulmonary disease.   Electronically Signed   By: Rolm Baptise M.D.   On: 03/18/2016 11:30  Assessment and Plan Shonya was seen today for cough.  Diagnoses and all orders for this visit:  Cough- pt afebrile, discussed symptom control Reviewed xray Will have close follow up No need for further imaging -     HYDROcodone-homatropine (HYCODAN) 5-1.5 MG/5ML syrup; Take 5 mLs by mouth every 8 (eight) hours as needed for cough. -     benzonatate (TESSALON) 100 MG capsule; Take 1-2 capsules (100-200 mg total) by mouth 3 (three) times daily as needed for cough.  Bronchospasm- added zantac to help with reflux  Albuterol for cough and bronchospasm -     albuterol (PROVENTIL HFA;VENTOLIN HFA) 108 (90 Base) MCG/ACT inhaler; Inhale 2 puffs into the lungs every 6 (six) hours as needed for wheezing or shortness of breath. -     ranitidine (ZANTAC) 150 MG tablet; Take 1 tablet (150 mg total) by mouth 2 (two) times daily.  Essential hypertension, benign- bp too tightly controlled Advised pt to stop amlodipine  Discussed that patient should follow up in 2 weeks for bp check  Continue hctz      Aprille Sawhney A Dilyn Smiles

## 2016-04-05 NOTE — Patient Instructions (Addendum)
   Your blood pressure is a little low for your age Stop the amlodipine  Start albuterol at bedtime and during the daytime for cough  Also try zantac (ranitidine) twice a day to help with the cough and frequent throat clearing.   Return to clinic in 2 weeks for follow up on blood pressure and cough    IF you received an x-ray today, you will receive an invoice from [facility]. Please contact [facility] at [phone] with questions or concerns regarding your invoice.   IF you received labwork today, you will receive an invoice from [place]. Please contact [facility] at [phone] with questions or concerns regarding your invoice.   Our billing staff will not be able to assist you with questions regarding bills from these companies.  You will be contacted with the lab results as soon as they are available. The fastest way to get your results is to activate your My Chart account. Instructions are located on the last page of this paperwork. If you have not heard from [place] regarding the results in 2 weeks, please contact this office.

## 2016-04-06 DIAGNOSIS — M5030 Other cervical disc degeneration, unspecified cervical region: Secondary | ICD-10-CM | POA: Diagnosis not present

## 2016-04-06 DIAGNOSIS — M9901 Segmental and somatic dysfunction of cervical region: Secondary | ICD-10-CM | POA: Diagnosis not present

## 2016-04-06 DIAGNOSIS — M9903 Segmental and somatic dysfunction of lumbar region: Secondary | ICD-10-CM | POA: Diagnosis not present

## 2016-04-06 DIAGNOSIS — M9902 Segmental and somatic dysfunction of thoracic region: Secondary | ICD-10-CM | POA: Diagnosis not present

## 2016-04-10 DIAGNOSIS — M7062 Trochanteric bursitis, left hip: Secondary | ICD-10-CM | POA: Diagnosis not present

## 2016-04-10 DIAGNOSIS — M25552 Pain in left hip: Secondary | ICD-10-CM | POA: Diagnosis not present

## 2016-04-11 DIAGNOSIS — M1990 Unspecified osteoarthritis, unspecified site: Secondary | ICD-10-CM | POA: Diagnosis not present

## 2016-04-11 DIAGNOSIS — I25118 Atherosclerotic heart disease of native coronary artery with other forms of angina pectoris: Secondary | ICD-10-CM | POA: Diagnosis not present

## 2016-04-11 DIAGNOSIS — I10 Essential (primary) hypertension: Secondary | ICD-10-CM | POA: Diagnosis not present

## 2016-04-11 DIAGNOSIS — E049 Nontoxic goiter, unspecified: Secondary | ICD-10-CM | POA: Diagnosis not present

## 2016-04-11 DIAGNOSIS — I361 Nonrheumatic tricuspid (valve) insufficiency: Secondary | ICD-10-CM | POA: Diagnosis not present

## 2016-04-11 DIAGNOSIS — E785 Hyperlipidemia, unspecified: Secondary | ICD-10-CM | POA: Diagnosis not present

## 2016-04-11 DIAGNOSIS — R7309 Other abnormal glucose: Secondary | ICD-10-CM | POA: Diagnosis not present

## 2016-04-30 ENCOUNTER — Emergency Department (HOSPITAL_COMMUNITY)
Admission: EM | Admit: 2016-04-30 | Discharge: 2016-05-01 | Disposition: A | Discharge status: Home / Self Care | Payer: Medicare Other | Attending: Emergency Medicine | Admitting: Emergency Medicine

## 2016-04-30 ENCOUNTER — Encounter (HOSPITAL_COMMUNITY): Payer: Self-pay

## 2016-04-30 DIAGNOSIS — I10 Essential (primary) hypertension: Secondary | ICD-10-CM | POA: Diagnosis not present

## 2016-04-30 DIAGNOSIS — M9903 Segmental and somatic dysfunction of lumbar region: Secondary | ICD-10-CM | POA: Diagnosis not present

## 2016-04-30 DIAGNOSIS — M9901 Segmental and somatic dysfunction of cervical region: Secondary | ICD-10-CM | POA: Diagnosis not present

## 2016-04-30 DIAGNOSIS — Z79899 Other long term (current) drug therapy: Secondary | ICD-10-CM | POA: Insufficient documentation

## 2016-04-30 DIAGNOSIS — M9902 Segmental and somatic dysfunction of thoracic region: Secondary | ICD-10-CM | POA: Diagnosis not present

## 2016-04-30 DIAGNOSIS — R42 Dizziness and giddiness: Secondary | ICD-10-CM | POA: Insufficient documentation

## 2016-04-30 DIAGNOSIS — Z7982 Long term (current) use of aspirin: Secondary | ICD-10-CM | POA: Insufficient documentation

## 2016-04-30 DIAGNOSIS — M5030 Other cervical disc degeneration, unspecified cervical region: Secondary | ICD-10-CM | POA: Diagnosis not present

## 2016-04-30 DIAGNOSIS — R404 Transient alteration of awareness: Secondary | ICD-10-CM | POA: Diagnosis not present

## 2016-04-30 DIAGNOSIS — R51 Headache: Secondary | ICD-10-CM | POA: Diagnosis not present

## 2016-04-30 LAB — BASIC METABOLIC PANEL
ANION GAP: 8 (ref 5–15)
BUN: 10 mg/dL (ref 6–20)
CALCIUM: 9.7 mg/dL (ref 8.9–10.3)
CO2: 22 mmol/L (ref 22–32)
CREATININE: 0.74 mg/dL (ref 0.44–1.00)
Chloride: 105 mmol/L (ref 101–111)
GFR calc non Af Amer: >60 mL/min (ref >60)
Glucose, Bld: 124 mg/dL — ABNORMAL HIGH (ref 65–99)
Potassium: 3.2 mmol/L — ABNORMAL LOW (ref 3.5–5.1)
SODIUM: 135 mmol/L (ref 135–145)

## 2016-04-30 LAB — URINALYSIS, ROUTINE W REFLEX MICROSCOPIC
BILIRUBIN URINE: NEGATIVE
Bacteria, UA: NONE SEEN
GLUCOSE, UA: NEGATIVE mg/dL
HGB URINE DIPSTICK: NEGATIVE
KETONES UR: NEGATIVE mg/dL
NITRITE: NEGATIVE
PROTEIN: NEGATIVE mg/dL
Specific Gravity, Urine: 1.005 (ref 1.005–1.030)
pH: 7 (ref 5.0–8.0)

## 2016-04-30 LAB — CBG MONITORING, ED: Glucose-Capillary: 117 mg/dL — ABNORMAL HIGH (ref 65–99)

## 2016-04-30 LAB — CBC
HCT: 43 % (ref 36.0–46.0)
HEMOGLOBIN: 14.3 g/dL (ref 12.0–15.0)
MCH: 27.1 pg (ref 26.0–34.0)
MCHC: 33.3 g/dL (ref 30.0–36.0)
MCV: 81.6 fL (ref 78.0–100.0)
PLATELETS: 214 10*3/uL (ref 150–400)
RBC: 5.27 MIL/uL — AB (ref 3.87–5.11)
RDW: 14.9 % (ref 11.5–15.5)
WBC: 5.5 10*3/uL (ref 4.0–10.5)

## 2016-04-30 MED ORDER — MECLIZINE HCL 25 MG PO TABS
12.5000 mg | ORAL_TABLET | Freq: Once | ORAL | Status: AC
  Administered 2016-04-30: 12.5 mg via ORAL
  Filled 2016-04-30: qty 1

## 2016-04-30 MED ORDER — IBUPROFEN 200 MG PO TABS
400.0000 mg | ORAL_TABLET | Freq: Once | ORAL | Status: AC | PRN
  Administered 2016-04-30: 400 mg via ORAL
  Filled 2016-04-30: qty 2

## 2016-04-30 MED ORDER — POTASSIUM CHLORIDE CRYS ER 20 MEQ PO TBCR
20.0000 meq | EXTENDED_RELEASE_TABLET | Freq: Once | ORAL | Status: AC
  Administered 2016-04-30: 20 meq via ORAL
  Filled 2016-04-30: qty 1

## 2016-04-30 NOTE — ED Triage Notes (Addendum)
Pt BIB from home with [facility]. She called out for dizziness starting this morning when she woke up. It got better until this evening when she bent over to pick something up causing her to get dizzy and experience a near-syncopal episode. Hx HTN and pt reports being compliant with her medication. Also endorses slight headache, but denies visual changes or any numbness. A&Ox4.

## 2016-04-30 NOTE — ED Provider Notes (Signed)
[facility] DEPT Provider Note   CSN: [id number] Arrival date & time: 04/30/16  2022   By signing my name below, I, Delton Prairie, attest that this documentation has been prepared under the direction and in the presence of Quintella Reichert, MD  Electronically Signed: Delton Prairie, ED Scribe. 04/30/16. 11:54 PM.   History   Chief Complaint Chief Complaint  Patient presents with  . Dizziness    HPI Comments:  Kathleen Pacheco is a 81 y.o. female, with a PMHx of hyperlipidemia, HTN and vertigo, who presents to the Emergency Department, via EMS, complaining of acute onset, intermittent episodes of dizziness beginning in the AM today. Pt notes when she awoke she stood up, felt dizzy and also felt like she was going to pass out. Pt also reports a gradually worsening migratory mild headache, generalized weakness and blurry vision. She states her dizziness comes on every time she moves. Pt reports a hx of similar symptoms but notes today's symptoms are worse. No alleviating or modifying factors noted. Pt denies numbness, fevers, vomiting, chest pain, dysuria, current dizziness or any other associated symptoms. No other complaints noted. Sxs have been resolved since ED arrival.    The history is provided by the patient. No language interpreter was used.    Past Medical History:  Diagnosis Date  . Bradycardia   . Gout   . Hx of bladder infections   . Hyperlipidemia   . Hypertension   . Migraine   . Thyroid disease   . Vertigo     Patient Active Problem List   Diagnosis Date Noted  . Acute bronchitis 03/18/2016  . Osteoarthrosis, hand 10/22/2013  . DIZZINESS 02/02/2009  . HYPERCHOLESTEROLEMIA 01/25/2009  . HYPERTENSION 01/25/2009    Past Surgical History:  Procedure Laterality Date  . ABDOMINAL HYSTERECTOMY    . CHOLECYSTECTOMY    . GALLBLADDER SURGERY    . PARATHYROIDECTOMY      OB History    Gravida Para Term Preterm AB Living   4 3 3   1 3    SAB TAB Ectopic Multiple  Live Births   1               Home Medications    Prior to Admission medications   Medication Sig Start Date End Date Taking? Authorizing Provider  albuterol (PROVENTIL HFA;VENTOLIN HFA) 108 (90 Base) MCG/ACT inhaler Inhale 2 puffs into the lungs every 6 (six) hours as needed for wheezing or shortness of breath. 04/05/16  Yes Zoe A Nolon Rod, MD  Alpha-D-Galactosidase (BEANO) TABS Take 2 tablets by mouth every 6 (six) hours as needed (for gas).   Yes Historical Provider, MD  amLODipine (NORVASC) 5 MG tablet Take 5 mg by mouth daily.   Yes Historical Provider, MD  aspirin EC 81 MG tablet Take 81 mg by mouth daily.   Yes Historical Provider, MD  B Complex-C (B-COMPLEX WITH VITAMIN C) tablet Take 1 tablet by mouth daily.   Yes Historical Provider, MD  benzonatate (TESSALON) 100 MG capsule Take 1-2 capsules (100-200 mg total) by mouth 3 (three) times daily as needed for cough. 04/05/16  Yes Forrest Moron, MD  cetirizine (ZYRTEC) 10 MG tablet Take 10 mg by mouth daily as needed for allergies.    Yes Historical Provider, MD  cholecalciferol (VITAMIN D) 1000 units tablet Take 2,000 Units by mouth daily.   Yes Historical Provider, MD  Cyanocobalamin 1000 MCG/15ML LIQD Take 1,000 mcg by mouth daily.   Yes Historical Provider, MD  hydrochlorothiazide (  HYDRODIURIL) 25 MG tablet Take 25 mg by mouth daily.   Yes Historical Provider, MD  HYDROcodone-homatropine (HYCODAN) 5-1.5 MG/5ML syrup Take 5 mLs by mouth every 8 (eight) hours as needed for cough. 04/05/16  Yes Forrest Moron, MD  Multiple Vitamins-Minerals (MULTIVITAMIN GUMMIES ADULT) [place] 3 each by mouth daily.   Yes Historical Provider, MD  potassium chloride SA (K-DUR,KLOR-CON) 20 MEQ tablet Take 20 mEq by mouth daily.   Yes Historical Provider, MD  traMADol (ULTRAM) 50 MG tablet Take 50 mg by mouth every 6 (six) hours as needed for moderate pain.   Yes Historical Provider, MD  TURMERIC PO Take 5 mLs by mouth daily.   Yes Historical Provider,  MD  meclizine (ANTIVERT) 12.5 MG tablet Take 1 tablet (12.5 mg total) by mouth 3 (three) times daily as needed for dizziness. 05/01/16   Quintella Reichert, MD  ranitidine (ZANTAC) 150 MG tablet Take 1 tablet (150 mg total) by mouth 2 (two) times daily. Patient not taking: Reported on 05/01/2016 04/05/16   Forrest Moron, MD    Family History History reviewed. No pertinent family history.  Social History Social History  Substance Use Topics  . Smoking status: Never Smoker  . Smokeless tobacco: Never Used  . Alcohol use 0.6 oz/week    1 Glasses of wine per week     Allergies   Ace inhibitors; Other; and Penicillins   Review of Systems Review of Systems  Constitutional: Negative for fever.  Cardiovascular: Negative for chest pain.  Gastrointestinal: Negative for vomiting.  Genitourinary: Negative for dysuria.  Neurological: Positive for dizziness, weakness, light-headedness and headaches. Negative for numbness.  All other systems reviewed and are negative.   Physical Exam Updated Vital Signs BP 126/83   Pulse (!) 57   Temp 97.5 F (36.4 C) (Oral)   Resp 14   SpO2 99%   Physical Exam  Constitutional: She is oriented to person, place, and time. She appears well-developed and well-nourished.  HENT:  Head: Normocephalic and atraumatic.  Left Ear: Tympanic membrane normal.  Right TM obscured by cerumen  Eyes: EOM are normal. Pupils are equal, round, and reactive to light.  Cardiovascular: Normal rate and regular rhythm.   No murmur heard. Pulmonary/Chest: Effort normal and breath sounds normal. No respiratory distress.  Abdominal: Soft. There is no tenderness. There is no rebound and no guarding.  Musculoskeletal: She exhibits no edema or tenderness.  Neurological: She is alert and oriented to person, place, and time.  5/5 strength to all extremities. Sensation to light touch intact. No facial asymmetry. No pronator drift. No ataxia on finger to nose.   Skin: Skin is warm  and dry.  Psychiatric: She has a normal mood and affect. Her behavior is normal.  Nursing note and vitals reviewed.    ED Treatments / Results  DIAGNOSTIC STUDIES:  Oxygen Saturation is 96% on RA, normal by my interpretation.    COORDINATION OF CARE:  11:50 PM Will CT-scan head. Discussed treatment plan with pt at bedside and pt agreed to plan.  Labs (all labs ordered are listed, but only abnormal results are displayed) Labs Reviewed  BASIC METABOLIC PANEL - Abnormal; Notable for the following:       Result Value   Potassium 3.2 (*)    Glucose, Bld 124 (*)    All other components within normal limits  CBC - Abnormal; Notable for the following:    RBC 5.27 (*)    All other components within normal limits  URINALYSIS, ROUTINE W REFLEX MICROSCOPIC - Abnormal; Notable for the following:    Leukocytes, UA LARGE (*)    Squamous Epithelial / LPF 0-5 (*)    All other components within normal limits  CBG MONITORING, ED - Abnormal; Notable for the following:    Glucose-Capillary 117 (*)    All other components within normal limits  URINE CULTURE  I-STAT TROPOININ, ED    EKG  EKG Interpretation  Date/Time:  Tuesday April 30 2016 20:35:43 EDT Ventricular Rate:  66 PR Interval:    QRS Duration: 84 QT Interval:  527 QTC Calculation: 553 R Axis:   -4 Text Interpretation:  Sinus rhythm Consider left atrial enlargement Borderline repolarization abnormality Prolonged QT interval Confirmed by Hazle Coca [phone]) on 04/30/2016 11:20:32 PM Also confirmed by Hazle Coca [phone])  on 05/01/2016 1:14:35 AM       Radiology Ct Head Wo Contrast  Result Date: 05/01/2016 CLINICAL DATA:  Acute onset dizziness beginning today. Gradually worsening headache, generalized weakness, and blurry vision. EXAM: CT HEAD WITHOUT CONTRAST TECHNIQUE: Contiguous axial images were obtained from the base of the skull through the vertex without intravenous contrast. COMPARISON:  02/02/2010 FINDINGS: Brain: No  evidence of acute infarction, hemorrhage, hydrocephalus, extra-axial collection or mass lesion/mass effect. Vascular: No hyperdense vessel or unexpected calcification. Skull: Normal. Negative for fracture or focal lesion. Sinuses/Orbits: No acute finding. Other: None. IMPRESSION: No acute intracranial abnormalities. Electronically Signed   By: Lucienne Capers M.D.   On: 05/01/2016 00:58    Procedures Procedures (including critical care time)  Medications Ordered in ED Medications  ibuprofen (ADVIL,MOTRIN) tablet 400 mg (400 mg Oral Given 04/30/16 2122)  potassium chloride SA (K-DUR,KLOR-CON) CR tablet 20 mEq (20 mEq Oral Given 04/30/16 2358)  meclizine (ANTIVERT) tablet 12.5 mg (12.5 mg Oral Given 04/30/16 2358)     Initial Impression / Assessment and Plan / ED Course  I have reviewed the triage vital signs and the nursing notes.  Pertinent labs & imaging results that were available during my care of the patient were reviewed by me and considered in my medical decision making (see chart for details).     Patient here for evaluation of dizziness described as vertiginous type symptoms. She is neurovascularly intact on examination with no vertigo symptoms while in the emergency department. She also has no orthostasis. Presentation is not consistent with ACS, PE, CVA. She does have mild hypokalemia and this is replaced orally. Counseled pt n on home care for vertigo. Discussed outpatient follow-up and return precautions.  Final Clinical Impressions(s) / ED Diagnoses   Final diagnoses:  Vertigo    New Prescriptions Discharge Medication List as of 05/01/2016  2:05 AM    START taking these medications   Details  meclizine (ANTIVERT) 12.5 MG tablet Take 1 tablet (12.5 mg total) by mouth 3 (three) times daily as needed for dizziness., Starting Wed 05/01/2016, Print      I personally performed the services described in this documentation, which was scribed in my presence. The recorded  information has been reviewed and is accurate.     Quintella Reichert, MD 05/01/16 [phone]

## 2016-05-01 ENCOUNTER — Emergency Department (HOSPITAL_COMMUNITY): Payer: Medicare Other

## 2016-05-01 ENCOUNTER — Telehealth: Payer: Self-pay | Admitting: Family Medicine

## 2016-05-01 DIAGNOSIS — R42 Dizziness and giddiness: Secondary | ICD-10-CM | POA: Diagnosis not present

## 2016-05-01 DIAGNOSIS — R51 Headache: Secondary | ICD-10-CM | POA: Diagnosis not present

## 2016-05-01 LAB — I-STAT TROPONIN, ED: Troponin i, poc: 0 ng/mL (ref 0.00–0.08)

## 2016-05-01 MED ORDER — MECLIZINE HCL 12.5 MG PO TABS: 12.5000 mg | ORAL_TABLET | Freq: Three times a day (TID) | ORAL | 0 refills | Status: DC | PRN

## 2016-05-01 NOTE — Telephone Encounter (Signed)
Patient needs her HYDROcodone-homatropine (HYCODAN) 5-1.5 MG/5ML syrup refilled  Her call back number is [phone]

## 2016-05-02 ENCOUNTER — Other Ambulatory Visit: Payer: Self-pay | Admitting: Family Medicine

## 2016-05-02 LAB — URINE CULTURE: Culture: <10000 — AB

## 2016-05-03 NOTE — Telephone Encounter (Signed)
04/05/16 last ov and refill 

## 2016-05-04 ENCOUNTER — Encounter: Payer: Self-pay | Admitting: Physician Assistant

## 2016-05-04 ENCOUNTER — Ambulatory Visit (INDEPENDENT_AMBULATORY_CARE_PROVIDER_SITE_OTHER): Payer: Medicare Other | Admitting: Physician Assistant

## 2016-05-04 VITALS — BP 144/78 | HR 63 | Temp 98.0°F | Resp 17 | Ht 63.0 in | Wt 196.5 lb

## 2016-05-04 DIAGNOSIS — R42 Dizziness and giddiness: Secondary | ICD-10-CM | POA: Diagnosis not present

## 2016-05-04 DIAGNOSIS — H6121 Impacted cerumen, right ear: Secondary | ICD-10-CM

## 2016-05-04 MED ORDER — MECLIZINE HCL 12.5 MG PO TABS: 12.5000 mg | ORAL_TABLET | Freq: Three times a day (TID) | ORAL | 0 refills | Status: DC | PRN

## 2016-05-04 NOTE — Assessment & Plan Note (Signed)
No etiology identified. Suspect BPPV. Continue meclizine PRN. Refer to ENT for additional evaluation and treatment.

## 2016-05-04 NOTE — Progress Notes (Signed)
THIS NOTE IS USED FOR EDUCATIONAL PURPOSES ONLY!!!   Name: Kathleen Pacheco  DOB: 1936-01-14  Age: 81 y.o. Sex: female  CC: No chief complaint on file.   PCP: Gildardo Cranker, DO  HPI: Patient reports today for c/o "vertigo".   Patient reports on 04/30/16 she had an episode of vertigo. She went to [facility] and she was diagnosed with "vertigo". She was given meclizine and was told to f/u with her PCP. She denies any headaches. She reports she is continuing to have dizziness every day. She is unsure of how the current medication is on is helping. She is unsure how frequently she is getting vertigo. She is unsure how long they last when they come on. She reports that usually she is dizzy with the vertigo. She reports that when she gets the episodes the room is spinning. She reports it usually comes on when she turns to look at the clock in the morning. Denies ringing in the ears.   She is unsure of any hearing changes with the medications. She reports that she lives alone. She reports her 3 daughters live far away. She reports that she has a friend who she can call if she needs help with anything. Patient reports she is well involved with her church, playing cards, and stays active within her community helping direct plays.    Diet consists of breakfast (oatmeal, eggs, [place] bacon), does not eat lunch, dinner is usually chicken with a vegetable. She reports drinking 4-5 bottles of water daily. She does denies using sodium on foods.   ROS:  Constitutional: Negative for activity change, appetite change, fatigue and unexpected weight change.  HENT: Negative for congestion, dental problem, ear pain, hearing loss, mouth sores, postnasal drip, rhinorrhea, sneezing, sore throat, tinnitus and trouble swallowing.   Eyes: Negative for photophobia, pain, redness and visual disturbance.  Respiratory: Negative for cough, chest tightness and shortness of breath.   Cardiovascular: Negative for chest pain,  palpitations and leg swelling.  Gastrointestinal: Negative for abdominal pain, blood in stool, constipation, diarrhea, nausea and vomiting.  Genitourinary: Negative for dysuria, frequency, hematuria and urgency.  Musculoskeletal: Negative for arthralgias, gait problem, myalgias and neck stiffness.  Skin: Negative for rash.  Neurological: Negative for dizziness, speech difficulty, weakness, light-headedness, numbness and headaches.  Hematological: Negative for adenopathy.  Psychiatric/Behavioral: Negative for confusion and sleep disturbance. The patient is not nervous/anxious.    PMH:  Patient Active Problem List   Diagnosis Date Noted  . Acute bronchitis 03/18/2016  . Osteoarthrosis, hand 10/22/2013  . DIZZINESS 02/02/2009  . HYPERCHOLESTEROLEMIA 01/25/2009  . HYPERTENSION 01/25/2009    Allergies:  Allergies  Allergen Reactions  . Ace Inhibitors Other (See Comments)    Reaction:  Unknown   . Other Swelling and Other (See Comments)    Pt states that she is allergic to artificial sweeteners.   Reaction:  Facial swelling   . Penicillins Other (See Comments)    Reaction:  Yeast infection  Has patient had a PCN reaction causing immediate rash, facial/tongue/throat swelling, SOB or lightheadedness with hypotension: No Has patient had a PCN reaction causing severe rash involving mucus membranes or skin necrosis: No Has patient had a PCN reaction that required hospitalization No Has patient had a PCN reaction occurring within the last 10 years: No If all of the above answers are "NO", then may proceed with Cephalosporin use.    Medications:  Current Outpatient Prescriptions on File Prior to Visit  Medication Sig Dispense Refill  .  albuterol (PROVENTIL HFA;VENTOLIN HFA) 108 (90 Base) MCG/ACT inhaler Inhale 2 puffs into the lungs every 6 (six) hours as needed for wheezing or shortness of breath. 18 g 1  . Alpha-D-Galactosidase (BEANO) TABS Take 2 tablets by mouth every 6 (six) hours as  needed (for gas).    Marland Kitchen amLODipine (NORVASC) 5 MG tablet Take 5 mg by mouth daily.    Marland Kitchen aspirin EC 81 MG tablet Take 81 mg by mouth daily.    . B Complex-C (B-COMPLEX WITH VITAMIN C) tablet Take 1 tablet by mouth daily.    . benzonatate (TESSALON) 100 MG capsule Take 1-2 capsules (100-200 mg total) by mouth 3 (three) times daily as needed for cough. 40 capsule 0  . cetirizine (ZYRTEC) 10 MG tablet Take 10 mg by mouth daily as needed for allergies.     . cholecalciferol (VITAMIN D) 1000 units tablet Take 2,000 Units by mouth daily.    . Cyanocobalamin 1000 MCG/15ML LIQD Take 1,000 mcg by mouth daily.    . hydrochlorothiazide (HYDRODIURIL) 25 MG tablet Take 25 mg by mouth daily.    Marland Kitchen HYDROcodone-homatropine (HYCODAN) 5-1.5 MG/5ML syrup Take 5 mLs by mouth every 8 (eight) hours as needed for cough. 120 mL 0  . meclizine (ANTIVERT) 12.5 MG tablet Take 1 tablet (12.5 mg total) by mouth 3 (three) times daily as needed for dizziness. 12 tablet 0  . Multiple Vitamins-Minerals (MULTIVITAMIN GUMMIES ADULT) CHEW Chew 3 each by mouth daily.    . potassium chloride SA (K-DUR,KLOR-CON) 20 MEQ tablet Take 20 mEq by mouth daily.    . ranitidine (ZANTAC) 150 MG tablet TAKE 1 TABLET (150 MG TOTAL) BY MOUTH 2 (TWO) TIMES DAILY. 60 tablet 0  . traMADol (ULTRAM) 50 MG tablet Take 50 mg by mouth every 6 (six) hours as needed for moderate pain.    . TURMERIC PO Take 5 mLs by mouth daily.     No current facility-administered medications on file prior to visit.     PE:  GS: WDWN female sitting on exam table in NAD.  Vitals: BP (!) 144/78   Pulse 63   Temp 98 F (36.7 C) (Oral)   Resp 17   Ht 5\' 3"  (1.6 m)   Wt 196 lb 8 oz (89.1 kg)   SpO2 98%   BMI 34.81 kg/m  HEENT: Normocephalic, atruamatic. PEARRL. No cervical lymphadenopathy. No thyroid nodules, normal size, and equal bilaterally. Ears: Bilateral ears without erythema, TM clear with good coen of light. (After removal of cerumen impaction of R ear) TM  without bulging. Negative Dix-Hallpike. Weber: Unable to hear sound on either side. Rhinnae: AC >BC bilaterally.  Cardiovascular: RRR. No S3 or S4. No murmurs, rubs, or gallops. Pulses 2+ and equal bilateral in the upper and lower extremities. No pitting edema. No varicosities, clubbing, or cyanosis.  Pulm: CTA bilaterally. No expiratory muscle use while breathing.  GI: +BS. NTND. No rigidity or guarding. No rebound tenderness.  Neuro: CN 2-12 grossly intact.  Psych: A&O x 4. Mood and affect appropriate for situation.  Skin: Warm and dry. No rashes or excoriations on exposed skin.   A&P:  1. DIZZINESS - continued on Meclizine with not much symptom improvement. Referral sent.   Plan: Ambulatory referral to ENT, meclizine (ANTIVERT) 12.5 MG tablet  2. Impacted cerumen of right ear - Plan: Ear wax removal       Respectfully,  Delilah Shan, PA-S2

## 2016-05-04 NOTE — Progress Notes (Signed)
Patient ID: Kathleen Pacheco, female    DOB: October 11, 1935, 81 y.o.   MRN: [id number]  PCP: Gildardo Cranker, DO  Chief Complaint  Patient presents with  . Dizziness    Started on Tuesday, went to ER and has ran out of Meclizine & dizziness has not improved    Subjective:   Presents for evaluation of dizziness.  History is obtained from the patient and through chart review.  She has had vertigo in the past, and when it recurred 04/30/2016, she presented to the emergency department for evaluation. She described dizziness when she stood up, feeling like she might faint, associated with mild headache, generalized weakness and blurred vision.  She described the room spinning every time she moved. This was similar to previous episodes of vertigo, but much worse. She denied alleviating factors, and associated symptoms, including fever, chills, vomiting, CP, SOB, numbness, urinary urgency or frequency, diarrhea, melena, hematochezia. Symptoms upon arrival in the ED.  Evaluation in the ED included: Troponin I 0.00 CBC normal BMET K+ 3.2, glucose 124, otherwise normal UA large leukocyctes, otherwise normal UCx <10,000 colonies/mL INSIGNIFICANT GROWTH CT scan Head WO contrast showed no acute intracranial abnormalities. EKG revealed sinus rhythm with possible LAE, borderline repolarization abnormality and prolonged QT interval.  Exam was remarkable for cerumen obscuring the RIGHT TM, but was otherwise normal, including neurological exam.  She was diagnosed with vertigo, prescribed meclizine and advised to follow up as needed.  She relates that she isn't sure if the meclizine is helping. She still experiences the room spinning when she turns her head rapidly, like when she looks at the clock next to her bed. She isn't able to quantify how often the episodes happen or how long they last. She isn't sure if she is experiencing andy hearing loss, but denies tinnitus.   She lives alone, but is very  active socially and in her church. Three adult daughters live in [place], [place] and [place]. She eats a hearty breakfast and sensible supper. Drinks 4-5 bottles of water daily. She avoids sodium in her diet.    Review of Systems Constitutional: Negative for activity change, appetite change, fatigueand unexpected weight change.  HENT: Negative for congestion, dental problem, ear pain, hearing loss, mouth sores, postnasal drip, rhinorrhea, sneezing, sore throat, tinnitusand trouble swallowing.  Eyes: Negative for photophobia, pain, rednessand visual disturbance.  Respiratory: Negative for cough, chest tightnessand shortness of breath.  Cardiovascular: Negative for chest pain, palpitationsand leg swelling.  Gastrointestinal: Negative for abdominal pain, blood in stool, constipation, diarrhea, nauseaand vomiting.  Genitourinary: Negative for dysuria, frequency, hematuriaand urgency.  Musculoskeletal: Negative for arthralgias, gait problem, myalgiasand neck stiffness.  Skin: Negative for rash.  Neurological: Negative for dizziness, speech difficulty, weakness, light-headedness, numbnessand headaches.  Hematological: Negative for adenopathy.  Psychiatric/Behavioral: Negative for confusionand sleep disturbance. The patient is not nervous/anxious.     Patient Active Problem List   Diagnosis Date Noted  . Acute bronchitis 03/18/2016  . Osteoarthrosis, hand 10/22/2013  . DIZZINESS 02/02/2009  . HYPERCHOLESTEROLEMIA 01/25/2009  . HYPERTENSION 01/25/2009     Prior to Admission medications   Medication Sig Start Date End Date Taking? Authorizing Provider  albuterol (PROVENTIL HFA;VENTOLIN HFA) 108 (90 Base) MCG/ACT inhaler Inhale 2 puffs into the lungs every 6 (six) hours as needed for wheezing or shortness of breath. 04/05/16  Yes Zoe A Nolon Rod, MD  Alpha-D-Galactosidase (BEANO) TABS Take 2 tablets by mouth every 6 (six) hours as needed (for gas).   Yes Historical Provider,  MD    amLODipine (NORVASC) 5 MG tablet Take 5 mg by mouth daily.   Yes Historical Provider, MD  aspirin EC 81 MG tablet Take 81 mg by mouth daily.   Yes Historical Provider, MD  B Complex-C (B-COMPLEX WITH VITAMIN C) tablet Take 1 tablet by mouth daily.   Yes Historical Provider, MD  cetirizine (ZYRTEC) 10 MG tablet Take 10 mg by mouth daily as needed for allergies.    Yes Historical Provider, MD  cholecalciferol (VITAMIN D) 1000 units tablet Take 2,000 Units by mouth daily.   Yes Historical Provider, MD  Cyanocobalamin 1000 MCG/15ML LIQD Take 1,000 mcg by mouth daily.   Yes Historical Provider, MD  hydrochlorothiazide (HYDRODIURIL) 25 MG tablet Take 25 mg by mouth daily.   Yes Historical Provider, MD  Multiple Vitamins-Minerals (MULTIVITAMIN GUMMIES ADULT) CHEW Chew 3 each by mouth daily.   Yes Historical Provider, MD  potassium chloride SA (K-DUR,KLOR-CON) 20 MEQ tablet Take 20 mEq by mouth daily.   Yes Historical Provider, MD  traMADol (ULTRAM) 50 MG tablet Take 50 mg by mouth every 6 (six) hours as needed for moderate pain.   Yes Historical Provider, MD  TURMERIC PO Take 5 mLs by mouth daily.   Yes Historical Provider, MD  benzonatate (TESSALON) 100 MG capsule Take 1-2 capsules (100-200 mg total) by mouth 3 (three) times daily as needed for cough. Patient not taking: Reported on 05/04/2016 04/05/16   Forrest Moron, MD  HYDROcodone-homatropine [facility]) 5-1.5 MG/5ML syrup Take 5 mLs by mouth every 8 (eight) hours as needed for cough. Patient not taking: Reported on 05/04/2016 04/05/16   Forrest Moron, MD  meclizine (ANTIVERT) 12.5 MG tablet Take 1 tablet (12.5 mg total) by mouth 3 (three) times daily as needed for dizziness. Patient not taking: Reported on 05/04/2016 05/01/16   Quintella Reichert, MD  ranitidine (ZANTAC) 150 MG tablet TAKE 1 TABLET (150 MG TOTAL) BY MOUTH 2 (TWO) TIMES DAILY. Patient not taking: Reported on 05/04/2016 05/03/16   Forrest Moron, MD     Allergies  Allergen Reactions  .  Ace Inhibitors Other (See Comments)    Reaction:  Unknown   . Other Swelling and Other (See Comments)    Pt states that she is allergic to artificial sweeteners.   Reaction:  Facial swelling   . Penicillins Other (See Comments)    Reaction:  Yeast infection  Has patient had a PCN reaction causing immediate rash, facial/tongue/throat swelling, SOB or lightheadedness with hypotension: No Has patient had a PCN reaction causing severe rash involving mucus membranes or skin necrosis: No Has patient had a PCN reaction that required hospitalization No Has patient had a PCN reaction occurring within the last 10 years: No If all of the above answers are "NO", then may proceed with Cephalosporin use.       Objective:  Physical Exam  Constitutional: She is oriented to person, place, and time. Vital signs are normal. She appears well-developed and well-nourished. She is active and cooperative. No distress.  BP (!) 144/78   Pulse 63   Temp 98 F (36.7 C) (Oral)   Resp 17   Ht 5\' 3"  (1.6 m)   Wt 196 lb 8 oz (89.1 kg)   SpO2 98%   BMI 34.81 kg/m    HENT:  Head: Normocephalic and atraumatic.  Right Ear: Hearing, tympanic membrane and external ear normal. A foreign body (cerumen impaction, cleared with irrigation) is present.  Left Ear: Hearing, tympanic membrane, external ear  and ear canal normal. No foreign bodies.  Nose: Nose normal.  Mouth/Throat: Uvula is midline, oropharynx is clear and moist and mucous membranes are normal. No oral lesions. Normal dentition. No dental abscesses or uvula swelling. No oropharyngeal exudate.  Once cerumen impaction cleared, patient unable to hear tuning fork in either ear with Weber test. AC>BC bilaterally with Rinne test.  Eyes: Conjunctivae, EOM and lids are normal. Pupils are equal, round, and reactive to light. Right eye exhibits no discharge. Left eye exhibits no discharge. No scleral icterus.  Fundoscopic exam:      The right eye shows no arteriolar  narrowing, no AV nicking, no exudate, no hemorrhage and no papilledema. The right eye shows red reflex.       The left eye shows no arteriolar narrowing, no AV nicking, no exudate, no hemorrhage and no papilledema. The left eye shows red reflex.  Neck: Trachea normal, normal range of motion and full passive range of motion without pain. Neck supple. No spinous process tenderness and no muscular tenderness present. No thyroid mass and no thyromegaly present.  Cardiovascular: Normal rate, regular rhythm, normal heart sounds, intact distal pulses and normal pulses.   Pulmonary/Chest: Effort normal and breath sounds normal.  Musculoskeletal: She exhibits no edema or tenderness.       Cervical back: Normal.       Thoracic back: Normal.       Lumbar back: Normal.  Lymphadenopathy:       Head (right side): No tonsillar, no preauricular, no posterior auricular and no occipital adenopathy present.       Head (left side): No tonsillar, no preauricular, no posterior auricular and no occipital adenopathy present.    She has no cervical adenopathy.       Right: No supraclavicular adenopathy present.       Left: No supraclavicular adenopathy present.  Neurological: She is alert and oriented to person, place, and time. She has normal strength and normal reflexes. No cranial nerve deficit or sensory deficit. She exhibits normal muscle tone. Coordination and gait normal.  Reflex Scores:      Bicep reflexes are 2+ on the right side and 2+ on the left side.      Patellar reflexes are 2+ on the right side and 2+ on the left side.      Achilles reflexes are 2+ on the right side and 2+ on the left side. No nystagmus or reproduction of vertiginous symptoms with Dix-Hallpike maneuver.  Skin: Skin is warm, dry and intact. No rash noted. She is not diaphoretic. No cyanosis or erythema. Nails show no clubbing.  Psychiatric: She has a normal mood and affect. Her speech is normal and behavior is normal. Judgment and  thought content normal.           Assessment & Plan:   Problem List Items Addressed This Visit    DIZZINESS - Primary    No etiology identified. Suspect BPPV. Continue meclizine PRN. Refer to ENT for additional evaluation and treatment.      Relevant Medications   meclizine (ANTIVERT) 12.5 MG tablet   Other Relevant Orders   Ambulatory referral to ENT    Other Visit Diagnoses    Impacted cerumen of right ear       Relevant Orders   Ear wax removal (Completed)       Return if symptoms worsen or fail to improve.   Fara Chute, PA-C [facility]

## 2016-05-04 NOTE — Patient Instructions (Signed)
     IF you received an x-ray today, you will receive an invoice from [facility]. Please contact [facility] at [phone] with questions or concerns regarding your invoice.   IF you received labwork today, you will receive an invoice from [facility]. Please contact [facility] at [phone] with questions or concerns regarding your invoice.   Our billing staff will not be able to assist you with questions regarding bills from these companies.  You will be contacted with the lab results as soon as they are available. The fastest way to get your results is to activate your My Chart account. Instructions are located on the last page of this paperwork. If you have not heard from us regarding the results in 2 weeks, please contact this office.     

## 2016-05-08 DIAGNOSIS — M79641 Pain in right hand: Secondary | ICD-10-CM | POA: Diagnosis not present

## 2016-05-08 DIAGNOSIS — M79642 Pain in left hand: Secondary | ICD-10-CM | POA: Diagnosis not present

## 2016-05-08 DIAGNOSIS — M25552 Pain in left hip: Secondary | ICD-10-CM | POA: Diagnosis not present

## 2016-05-08 NOTE — Telephone Encounter (Signed)
No more hycodan refills. It is not appropriate. Let her know that her diagnosis of dizziness and her age are the main factors the refill is not appropriate. If the cough is that severe I can refer her to a lung doctor

## 2016-05-09 NOTE — Telephone Encounter (Signed)
Spoke with patient.  Continues to cough, and is having her roof evaluated as a possible source of something that is contributing.  She will call back once the roof issue is sorted if she continues to cough and needs the referral.

## 2016-05-21 ENCOUNTER — Telehealth: Payer: Self-pay | Admitting: Internal Medicine

## 2016-05-21 NOTE — Telephone Encounter (Signed)
Left msg asking pt to schedule AWV and CPE that are both due now. VDM (DD)

## 2016-05-22 DIAGNOSIS — H8123 Vestibular neuronitis, bilateral: Secondary | ICD-10-CM | POA: Diagnosis not present

## 2016-05-28 DIAGNOSIS — M5030 Other cervical disc degeneration, unspecified cervical region: Secondary | ICD-10-CM | POA: Diagnosis not present

## 2016-05-28 DIAGNOSIS — M9903 Segmental and somatic dysfunction of lumbar region: Secondary | ICD-10-CM | POA: Diagnosis not present

## 2016-05-28 DIAGNOSIS — M9901 Segmental and somatic dysfunction of cervical region: Secondary | ICD-10-CM | POA: Diagnosis not present

## 2016-05-28 DIAGNOSIS — M9902 Segmental and somatic dysfunction of thoracic region: Secondary | ICD-10-CM | POA: Diagnosis not present

## 2016-07-11 DIAGNOSIS — R7309 Other abnormal glucose: Secondary | ICD-10-CM | POA: Diagnosis not present

## 2016-07-11 DIAGNOSIS — I361 Nonrheumatic tricuspid (valve) insufficiency: Secondary | ICD-10-CM | POA: Diagnosis not present

## 2016-07-11 DIAGNOSIS — M9902 Segmental and somatic dysfunction of thoracic region: Secondary | ICD-10-CM | POA: Diagnosis not present

## 2016-07-11 DIAGNOSIS — M9901 Segmental and somatic dysfunction of cervical region: Secondary | ICD-10-CM | POA: Diagnosis not present

## 2016-07-11 DIAGNOSIS — I25118 Atherosclerotic heart disease of native coronary artery with other forms of angina pectoris: Secondary | ICD-10-CM | POA: Diagnosis not present

## 2016-07-11 DIAGNOSIS — I10 Essential (primary) hypertension: Secondary | ICD-10-CM | POA: Diagnosis not present

## 2016-07-11 DIAGNOSIS — M9903 Segmental and somatic dysfunction of lumbar region: Secondary | ICD-10-CM | POA: Diagnosis not present

## 2016-07-11 DIAGNOSIS — R42 Dizziness and giddiness: Secondary | ICD-10-CM | POA: Diagnosis not present

## 2016-07-11 DIAGNOSIS — M1990 Unspecified osteoarthritis, unspecified site: Secondary | ICD-10-CM | POA: Diagnosis not present

## 2016-07-11 DIAGNOSIS — M5030 Other cervical disc degeneration, unspecified cervical region: Secondary | ICD-10-CM | POA: Diagnosis not present

## 2016-07-11 DIAGNOSIS — E785 Hyperlipidemia, unspecified: Secondary | ICD-10-CM | POA: Diagnosis not present

## 2016-08-09 DIAGNOSIS — M9903 Segmental and somatic dysfunction of lumbar region: Secondary | ICD-10-CM | POA: Diagnosis not present

## 2016-08-09 DIAGNOSIS — M9902 Segmental and somatic dysfunction of thoracic region: Secondary | ICD-10-CM | POA: Diagnosis not present

## 2016-08-09 DIAGNOSIS — M5030 Other cervical disc degeneration, unspecified cervical region: Secondary | ICD-10-CM | POA: Diagnosis not present

## 2016-08-09 DIAGNOSIS — M9901 Segmental and somatic dysfunction of cervical region: Secondary | ICD-10-CM | POA: Diagnosis not present

## 2016-09-06 DIAGNOSIS — M9903 Segmental and somatic dysfunction of lumbar region: Secondary | ICD-10-CM | POA: Diagnosis not present

## 2016-09-06 DIAGNOSIS — M9902 Segmental and somatic dysfunction of thoracic region: Secondary | ICD-10-CM | POA: Diagnosis not present

## 2016-09-06 DIAGNOSIS — M5030 Other cervical disc degeneration, unspecified cervical region: Secondary | ICD-10-CM | POA: Diagnosis not present

## 2016-09-06 DIAGNOSIS — M9901 Segmental and somatic dysfunction of cervical region: Secondary | ICD-10-CM | POA: Diagnosis not present

## 2016-09-19 DIAGNOSIS — M9901 Segmental and somatic dysfunction of cervical region: Secondary | ICD-10-CM | POA: Diagnosis not present

## 2016-09-19 DIAGNOSIS — M9903 Segmental and somatic dysfunction of lumbar region: Secondary | ICD-10-CM | POA: Diagnosis not present

## 2016-09-19 DIAGNOSIS — M9902 Segmental and somatic dysfunction of thoracic region: Secondary | ICD-10-CM | POA: Diagnosis not present

## 2016-09-19 DIAGNOSIS — M5030 Other cervical disc degeneration, unspecified cervical region: Secondary | ICD-10-CM | POA: Diagnosis not present

## 2016-09-24 DIAGNOSIS — E785 Hyperlipidemia, unspecified: Secondary | ICD-10-CM | POA: Diagnosis not present

## 2016-09-24 DIAGNOSIS — I10 Essential (primary) hypertension: Secondary | ICD-10-CM | POA: Diagnosis not present

## 2016-09-26 DIAGNOSIS — I25118 Atherosclerotic heart disease of native coronary artery with other forms of angina pectoris: Secondary | ICD-10-CM | POA: Diagnosis not present

## 2016-09-26 DIAGNOSIS — E785 Hyperlipidemia, unspecified: Secondary | ICD-10-CM | POA: Diagnosis not present

## 2016-09-26 DIAGNOSIS — I361 Nonrheumatic tricuspid (valve) insufficiency: Secondary | ICD-10-CM | POA: Diagnosis not present

## 2016-09-26 DIAGNOSIS — R7309 Other abnormal glucose: Secondary | ICD-10-CM | POA: Diagnosis not present

## 2016-09-26 DIAGNOSIS — E049 Nontoxic goiter, unspecified: Secondary | ICD-10-CM | POA: Diagnosis not present

## 2016-09-26 DIAGNOSIS — M1990 Unspecified osteoarthritis, unspecified site: Secondary | ICD-10-CM | POA: Diagnosis not present

## 2016-09-26 DIAGNOSIS — I10 Essential (primary) hypertension: Secondary | ICD-10-CM | POA: Diagnosis not present

## 2016-10-15 ENCOUNTER — Encounter: Payer: Self-pay | Admitting: Urgent Care

## 2016-10-15 ENCOUNTER — Ambulatory Visit (INDEPENDENT_AMBULATORY_CARE_PROVIDER_SITE_OTHER): Payer: Medicare Other | Admitting: Urgent Care

## 2016-10-15 VITALS — BP 120/80 | HR 70 | Temp 97.6°F | Resp 18 | Ht 63.0 in | Wt 203.2 lb

## 2016-10-15 DIAGNOSIS — R350 Frequency of micturition: Secondary | ICD-10-CM | POA: Diagnosis not present

## 2016-10-15 DIAGNOSIS — Z23 Encounter for immunization: Secondary | ICD-10-CM

## 2016-10-15 DIAGNOSIS — E669 Obesity, unspecified: Secondary | ICD-10-CM | POA: Diagnosis not present

## 2016-10-15 DIAGNOSIS — R300 Dysuria: Secondary | ICD-10-CM

## 2016-10-15 DIAGNOSIS — R21 Rash and other nonspecific skin eruption: Secondary | ICD-10-CM

## 2016-10-15 DIAGNOSIS — Z6836 Body mass index (BMI) 36.0-36.9, adult: Secondary | ICD-10-CM

## 2016-10-15 LAB — POC MICROSCOPIC URINALYSIS (UMFC): Mucus: ABSENT

## 2016-10-15 LAB — POCT URINALYSIS DIP (MANUAL ENTRY)
Bilirubin, UA: NEGATIVE
Glucose, UA: NEGATIVE mg/dL
Ketones, POC UA: NEGATIVE mg/dL
Nitrite, UA: NEGATIVE
PH UA: 7 (ref 5.0–8.0)
PROTEIN UA: NEGATIVE mg/dL
SPEC GRAV UA: 1.02 (ref 1.010–1.025)
UROBILINOGEN UA: 0.2 U/dL

## 2016-10-15 MED ORDER — PHENAZOPYRIDINE HCL 100 MG PO TABS: 100.0000 mg | ORAL_TABLET | Freq: Three times a day (TID) | ORAL | 0 refills | Status: DC | PRN

## 2016-10-15 MED ORDER — TRIAMCINOLONE ACETONIDE 0.1 % EX CREA: 1.0000 "application " | TOPICAL_CREAM | Freq: Two times a day (BID) | CUTANEOUS | 0 refills | Status: DC

## 2016-10-15 NOTE — Progress Notes (Signed)
MRN: [id number] DOB: 1935/09/05  Subjective:   Kathleen Pacheco is a 81 y.o. female presenting for chief complaint of Dysuria (x1 month; denies burning/pain/blood) and Rash (x1-2 months on both ankles)  Reports ~2-3 month history of dysuria and urinary frequency. Has not tried medications for relief. Denies fever, hematuria, urinary urgency, flank pain, abdominal pain, pelvic pain, cloudy malordorous urine, genital rash and vaginal discharge, nausea and vomiting. Has a history of bladder infections, last episode was in March 2018. Does not hydrate well. She also reports that she has itching, dry scaly rash for the past 2 months. She does not do yard work. Denies pain, blisters, using new shoes, socks, detergents. Has tried an otc itch cream but does not know which one it is.  Kathleen Pacheco has a current medication list which includes the following prescription(s): albuterol, beano, amlodipine, aspirin ec, b-complex with vitamin c, cetirizine, multivitamin gummies adult, potassium chloride sa, tramadol, turmeric, and hydrochlorothiazide. Patient is allergic to ace inhibitors; other; and penicillins. [place] Pacheco  has a past medical history of Bradycardia; Gout; bladder infections; Hyperlipidemia; Hypertension; Migraine; Thyroid disease; and Vertigo. Also  has a past surgical history that includes Abdominal hysterectomy; Parathyroidectomy; Gallbladder surgery; and Cholecystectomy.  Objective:   Vitals: BP 120/80   Pulse 70   Temp 97.6 F (36.4 C) (Oral)   Resp 18   Ht 5\' 3"  (1.6 m)   Wt 203 lb 3.2 oz (92.2 kg)   SpO2 95%   BMI 36.00 kg/m   Physical Exam  Constitutional: She is oriented to person, place, and time. She appears well-developed and well-nourished.  Cardiovascular: Normal rate, regular rhythm and intact distal pulses.  Exam reveals no gallop and no friction rub.   No murmur heard. Pulmonary/Chest: No respiratory distress. She has no wheezes. She has no rales.  Abdominal: Soft. Bowel  sounds are normal. She exhibits no distension and no mass. There is no tenderness. There is no guarding.  Neurological: She is alert and oriented to person, place, and time.   Results for orders placed or performed in visit on 10/15/16 (from the past 24 hour(s))  POCT urinalysis dipstick     Status: Abnormal   Collection Time: 10/15/16  9:15 AM  Result Value Ref Range   Color, UA yellow yellow   Clarity, UA clear clear   Glucose, UA negative negative mg/dL   Bilirubin, UA negative negative   Ketones, POC UA negative negative mg/dL   Spec Grav, UA 1.020 1.010 - 1.025   Blood, UA trace-intact (A) negative   pH, UA 7.0 5.0 - 8.0   Protein Ur, POC negative negative mg/dL   Urobilinogen, UA 0.2 0.2 or 1.0 E.U./dL   Nitrite, UA Negative Negative   Leukocytes, UA Small (1+) (A) Negative  POCT Microscopic Urinalysis ([facility])     Status: Abnormal   Collection Time: 10/15/16  9:24 AM  Result Value Ref Range   WBC,UR,HPF,POC Few (A) None WBC/hpf   RBC,UR,HPF,POC None None RBC/hpf   Bacteria Few (A) None, Too numerous to count   Mucus Absent Absent   Epithelial Cells, UR Per Microscopy Few (A) None, Too numerous to count cells/hpf   Assessment and Plan :   Patient was not agreeable to get a bmet. States that she will check with her cardiologist to see if she can get a copy of blood work she had done over the summer.  1. Urinary frequency 2. Dysuria - Recommended patient hydrate very well. Will wait for urine culture results  to prescribe antibiotic course. In the meantime, patient is to use Pyridium. Return-to-clinic precautions discussed, patient verbalized understanding.  - POCT urinalysis dipstick - POCT Microscopic Urinalysis ([facility]) - Urine Culture  3. Rash and nonspecific skin eruption - Unclear etiology, will start Kenalog for itching. - Hemoglobin A1c - TSH  4. Class 2 obesity without serious comorbidity with body mass index (BMI) of 36.0 to 36.9 in adult, unspecified obesity  type - Labs pending - Hemoglobin A1c - TSH  5. Need for influenza vaccination - Flu Vaccine QUAD 6+ mos PF IM (Fluarix Quad PF)   Jaynee Eagles, PA-C [facility] [phone] 10/15/2016 9:25 AM

## 2016-10-15 NOTE — Patient Instructions (Addendum)
Urinary Frequency, Adult Urinary frequency means urinating more often than usual. People with urinary frequency urinate at least 8 times in 24 hours, even if they drink a normal amount of fluid. Although they urinate more often than normal, the total amount of urine produced in a day may be normal. Urinary frequency is also called pollakiuria. What are the causes? This condition may be caused by:  A urinary tract infection.  Obesity.  Bladder problems, such as bladder stones.  Caffeine or alcohol.  Eating food or drinking fluids that irritate the bladder. These include coffee, tea, soda, artificial sweeteners, citrus, tomato-based foods, and chocolate.  Certain medicines, such as medicines that help the body get rid of extra fluid (diuretics).  Muscle or nerve weakness.  Overactive bladder.  Chronic diabetes.  Interstitial cystitis.  In men, problems with the prostate, such as an enlarged prostate.  In women, pregnancy.  In some cases, the cause may not be known. What increases the risk? This condition is more likely to develop in:  Women who have gone through menopause.  Men with prostate problems.  People with a disease or injury that affects the nerves or spinal cord.  People who have or have had a condition that affects the brain, such as a stroke.  What are the signs or symptoms? Symptoms of this condition include:  Feeling an urgent need to urinate often. The stress and anxiety of needing to find a bathroom quickly can make this urge worse.  Urinating 8 or more times in 24 hours.  Urinating as often as every 1 to 2 hours.  How is this diagnosed? This condition is diagnosed based on your symptoms, your medical history, and a physical exam. You may have tests, such as:  Blood tests.  Urine tests.  Imaging tests, such as X-rays or ultrasounds.  A bladder test.  A test of your neurological system. This is the body system that senses the need to  urinate.  A test to check for problems in the urethra and bladder called cystoscopy.  You may also be asked to keep a bladder diary. A bladder diary is a record of what you eat and drink, how often you urinate, and how much you urinate. You may need to see a health care provider who specializes in conditions of the urinary tract (urologist) or kidneys (nephrologist). How is this treated? Treatment for this condition depends on the cause. Sometimes the condition goes away on its own and treatment is not necessary. If treatment is needed, it may include:  Taking medicine.  Learning exercises that strengthen the muscles that help control urination.  Following a bladder training program. This may include: ? Learning to delay going to the bathroom. ? Double urinating (voiding). This helps if you are not completely emptying your bladder. ? Scheduled voiding.  Making diet changes, such as: ? Avoiding caffeine. ? Drinking fewer fluids, especially alcohol. ? Not drinking in the evening. ? Not having foods or drinks that may irritate the bladder. ? Eating foods that help prevent or ease constipation. Constipation can make this condition worse.  Having the nerves in your bladder stimulated. There are two options for stimulating the nerves to your bladder: ? Outpatient electrical nerve stimulation. This is done by your health care provider. ? Surgery to implant a bladder pacemaker. The pacemaker helps to control the urge to urinate.  Follow these instructions at home:  Keep a bladder diary if told to by your health care provider.  Take over-the-counter   and prescription medicines only as told by your health care provider.  Do any exercises as told by your health care provider.  Follow a bladder training program as told by your health care provider.  Make any recommended diet changes.  Keep all follow-up visits as told by your health care provider. This is important. Contact a health care  provider if:  You start urinating more often.  You feel pain or irritation when you urinate.  You notice blood in your urine.  Your urine looks cloudy.  You develop a fever.  You begin vomiting. Get help right away if:  You are unable to urinate. This information is not intended to replace advice given to you by your health care provider. Make sure you discuss any questions you have with your health care provider. Document Released: 10/27/2008 Document Revised: 02/01/2015 Document Reviewed: 07/27/2014 Elsevier Interactive Patient Education  2018 [employer]     IF you received an x-ray today, you will receive an invoice from [facility]. Please contact [facility] at [phone] with questions or concerns regarding your invoice.   IF you received labwork today, you will receive an invoice from [facility]. Please contact [facility] at [phone] with questions or concerns regarding your invoice.   Our billing staff will not be able to assist you with questions regarding bills from these companies.  You will be contacted with the lab results as soon as they are available. The fastest way to get your results is to activate your My Chart account. Instructions are located on the last page of this paperwork. If you have not heard from us regarding the results in 2 weeks, please contact this office.      

## 2016-10-16 LAB — URINE CULTURE

## 2016-10-16 LAB — TSH: TSH: 1.69 u[IU]/mL (ref 0.450–4.500)

## 2016-10-16 LAB — HEMOGLOBIN A1C
ESTIMATED AVERAGE GLUCOSE: 123 mg/dL
Hgb A1c MFr Bld: 5.9 % — ABNORMAL HIGH (ref 4.8–5.6)

## 2016-10-18 ENCOUNTER — Ambulatory Visit (INDEPENDENT_AMBULATORY_CARE_PROVIDER_SITE_OTHER): Payer: Medicare Other | Admitting: Urgent Care

## 2016-10-18 ENCOUNTER — Encounter: Payer: Self-pay | Admitting: Urgent Care

## 2016-10-18 VITALS — BP 128/78 | HR 72 | Temp 98.7°F | Resp 17 | Ht 62.5 in | Wt 206.0 lb

## 2016-10-18 DIAGNOSIS — I10 Essential (primary) hypertension: Secondary | ICD-10-CM

## 2016-10-18 DIAGNOSIS — M9901 Segmental and somatic dysfunction of cervical region: Secondary | ICD-10-CM | POA: Diagnosis not present

## 2016-10-18 DIAGNOSIS — R350 Frequency of micturition: Secondary | ICD-10-CM

## 2016-10-18 DIAGNOSIS — E782 Mixed hyperlipidemia: Secondary | ICD-10-CM

## 2016-10-18 DIAGNOSIS — M9903 Segmental and somatic dysfunction of lumbar region: Secondary | ICD-10-CM | POA: Diagnosis not present

## 2016-10-18 DIAGNOSIS — R21 Rash and other nonspecific skin eruption: Secondary | ICD-10-CM

## 2016-10-18 DIAGNOSIS — M9902 Segmental and somatic dysfunction of thoracic region: Secondary | ICD-10-CM | POA: Diagnosis not present

## 2016-10-18 DIAGNOSIS — M5030 Other cervical disc degeneration, unspecified cervical region: Secondary | ICD-10-CM | POA: Diagnosis not present

## 2016-10-18 MED ORDER — HYDROXYZINE HCL 50 MG PO TABS: 50.0000 mg | ORAL_TABLET | Freq: Every evening | ORAL | 1 refills | Status: DC | PRN

## 2016-10-18 NOTE — Patient Instructions (Addendum)
Insomnia Insomnia is a sleep disorder that makes it difficult to fall asleep or to stay asleep. Insomnia can cause tiredness (fatigue), low energy, difficulty concentrating, mood swings, and poor performance at work or school. There are three different ways to classify insomnia:  Difficulty falling asleep.  Difficulty staying asleep.  Waking up too early in the morning.  Any type of insomnia can be long-term (chronic) or short-term (acute). Both are common. Short-term insomnia usually lasts for three months or less. Chronic insomnia occurs at least three times a week for longer than three months. What are the causes? Insomnia may be caused by another condition, situation, or substance, such as:  Anxiety.  Certain medicines.  Gastroesophageal reflux disease (GERD) or other gastrointestinal conditions.  Asthma or other breathing conditions.  Restless legs syndrome, sleep apnea, or other sleep disorders.  Chronic pain.  Menopause. This may include hot flashes.  Stroke.  Abuse of alcohol, tobacco, or illegal drugs.  Depression.  Caffeine.  Neurological disorders, such as Alzheimer disease.  An overactive thyroid (hyperthyroidism).  The cause of insomnia may not be known. What increases the risk? Risk factors for insomnia include:  Gender. Women are more commonly affected than men.  Age. Insomnia is more common as you get older.  Stress. This may involve your professional or personal life.  Income. Insomnia is more common in people with lower income.  Lack of exercise.  Irregular work schedule or night shifts.  Traveling between different time zones.  What are the signs or symptoms? If you have insomnia, trouble falling asleep or trouble staying asleep is the main symptom. This may lead to other symptoms, such as:  Feeling fatigued.  Feeling nervous about going to sleep.  Not feeling rested in the morning.  Having trouble concentrating.  Feeling  irritable, anxious, or depressed.  How is this treated? Treatment for insomnia depends on the cause. If your insomnia is caused by an underlying condition, treatment will focus on addressing the condition. Treatment may also include:  Medicines to help you sleep.  Counseling or therapy.  Lifestyle adjustments.  Follow these instructions at home:  Take medicines only as directed by your health care provider.  Keep regular sleeping and waking hours. Avoid naps.  Keep a sleep diary to help you and your health care provider figure out what could be causing your insomnia. Include: ? When you sleep. ? When you wake up during the night. ? How well you sleep. ? How rested you feel the next day. ? Any side effects of medicines you are taking. ? What you eat and drink.  Make your bedroom a comfortable place where it is easy to fall asleep: ? Put up shades or special blackout curtains to block light from outside. ? Use a white noise machine to block noise. ? Keep the temperature cool.  Exercise regularly as directed by your health care provider. Avoid exercising right before bedtime.  Use relaxation techniques to manage stress. Ask your health care provider to suggest some techniques that may work well for you. These may include: ? Breathing exercises. ? Routines to release muscle tension. ? Visualizing peaceful scenes.  Cut back on alcohol, caffeinated beverages, and cigarettes, especially close to bedtime. These can disrupt your sleep.  Do not overeat or eat spicy foods right before bedtime. This can lead to digestive discomfort that can make it hard for you to sleep.  Limit screen use before bedtime. This includes: ? Watching TV. ? Using your smartphone, tablet, and   computer.  Stick to a routine. This can help you fall asleep faster. Try to do a quiet activity, brush your teeth, and go to bed at the same time each night.  Get out of bed if you are still awake after 15 minutes  of trying to sleep. Keep the lights down, but try reading or doing a quiet activity. When you feel sleepy, go back to bed.  Make sure that you drive carefully. Avoid driving if you feel very sleepy.  Keep all follow-up appointments as directed by your health care provider. This is important. Contact a health care provider if:  You are tired throughout the day or have trouble in your daily routine due to sleepiness.  You continue to have sleep problems or your sleep problems get worse. Get help right away if:  You have serious thoughts about hurting yourself or someone else. This information is not intended to replace advice given to you by your health care provider. Make sure you discuss any questions you have with your health care provider. Document Released: 12/29/1999 Document Revised: 06/02/2015 Document Reviewed: 10/01/2013 Elsevier Interactive Patient Education  2018 [employer].    Hydroxyzine capsules or tablets What is this medicine? HYDROXYZINE (hye [place] i zeen) is an antihistamine. This medicine is used to treat allergy symptoms. It is also used to treat anxiety and tension. This medicine can be used with other medicines to induce sleep before surgery. This medicine may be used for other purposes; ask your health care provider or pharmacist if you have questions. COMMON BRAND NAME(S): ANX, Atarax, Rezine, Vistaril What should I tell my health care provider before I take this medicine? They need to know if you have any of these conditions: -any chronic illness -difficulty passing urine -glaucoma -heart disease -kidney disease -liver disease -lung disease -an unusual or allergic reaction to hydroxyzine, cetirizine, other medicines, foods, dyes, or preservatives -pregnant or trying to get pregnant -breast-feeding How should I use this medicine? Take this medicine by mouth with a full glass of water. Follow the directions on the prescription label. You may take this  medicine with food or on an empty stomach. Take your medicine at regular intervals. Do not take your medicine more often than directed. Talk to your pediatrician regarding the use of this medicine in children. Special care may be needed. While this drug may be prescribed for children as young as 34 years of age for selected conditions, precautions do apply. Patients over 14 years old may have a stronger reaction and need a smaller dose. Overdosage: If you think you have taken too much of this medicine contact a poison control center or emergency room at once. NOTE: This medicine is only for you. Do not share this medicine with others. What if I miss a dose? If you miss a dose, take it as soon as you can. If it is almost time for your next dose, take only that dose. Do not take double or extra doses. What may interact with this medicine? -alcohol -barbiturate medicines for sleep or seizures -medicines for colds, allergies -medicines for depression, anxiety, or emotional disturbances -medicines for pain -medicines for sleep -muscle relaxants This list may not describe all possible interactions. Give your health care provider a list of all the medicines, herbs, non-prescription drugs, or dietary supplements you use. Also tell them if you smoke, drink alcohol, or use illegal drugs. Some items may interact with your medicine. What should I watch for while using this medicine? Tell your  doctor or health care professional if your symptoms do not improve. You may get drowsy or dizzy. Do not drive, use machinery, or do anything that needs mental alertness until you know how this medicine affects you. Do not stand or sit up quickly, especially if you are an older patient. This reduces the risk of dizzy or fainting spells. Alcohol may interfere with the effect of this medicine. Avoid alcoholic drinks. Your mouth may get dry. Chewing sugarless gum or sucking hard candy, and drinking plenty of water may help.  Contact your doctor if the problem does not go away or is severe. This medicine may cause dry eyes and blurred vision. If you wear contact lenses you may feel some discomfort. Lubricating drops may help. See your eye doctor if the problem does not go away or is severe. If you are receiving skin tests for allergies, tell your doctor you are using this medicine. What side effects may I notice from receiving this medicine? Side effects that you should report to your doctor or health care professional as soon as possible: -fast or irregular heartbeat -difficulty passing urine -seizures -slurred speech or confusion -tremor Side effects that usually do not require medical attention (report to your doctor or health care professional if they continue or are bothersome): -constipation -drowsiness -fatigue -headache -stomach upset This list may not describe all possible side effects. Call your doctor for medical advice about side effects. You may report side effects to FDA at 1-800-FDA-1088. Where should I keep my medicine? Keep out of the reach of children. Store at room temperature between 15 and 30 degrees C (59 and 86 degrees F). Keep container tightly closed. Throw away any unused medicine after the expiration date. NOTE: This sheet is a summary. It may not cover all possible information. If you have questions about this medicine, talk to your doctor, pharmacist, or health care provider.  2018 Elsevier/Gold Standard (2007-05-15 14:50:59)     IF you received an x-ray today, you will receive an invoice from [facility]. Please contact [facility] at [phone] with questions or concerns regarding your invoice.   IF you received labwork today, you will receive an invoice from [facility]. Please contact [facility] at [phone] with questions or concerns regarding your invoice.   Our billing staff will not be able to assist you with questions regarding bills from these  companies.  You will be contacted with the lab results as soon as they are available. The fastest way to get your results is to activate your My Chart account. Instructions are located on the last page of this paperwork. If you have not heard from [place] regarding the results in 2 weeks, please contact this office.

## 2016-10-18 NOTE — Progress Notes (Signed)
    MRN: [id number] DOB: Jan 12, 1936  Subjective:   Kathleen Pacheco is a 81 y.o. female presenting for follow up on urinary frequency, rash. Patient's urine culture from 10/15/2016 was negative. She admits not hydrating well and drinking a lot of tea. Denies fever, dysuria, hematuria, n/v, abdominal pain. Admits that her rash on her ankle is very much improved. She would also like a lab results review. Denies history of DM, heart disease.  Jae has a current medication list which includes the following prescription(s): albuterol, beano, amlodipine, aspirin ec, b-complex with vitamin c, cetirizine, hydrochlorothiazide, multivitamin gummies adult, phenazopyridine, potassium chloride sa, tramadol, triamcinolone cream, and turmeric. Also is allergic to ace inhibitors; other; and penicillins.  Vandana  has a past medical history of Bradycardia; Gout; bladder infections; Hyperlipidemia; Hypertension; Migraine; Thyroid disease; and Vertigo. Also  has a past surgical history that includes Abdominal hysterectomy; Parathyroidectomy; Gallbladder surgery; and Cholecystectomy.  Objective:   Vitals: BP 128/78   Pulse 72   Temp 98.7 F (37.1 C) (Oral)   Resp 17   Ht 5' 2.5" (1.588 m)   Wt 206 lb (93.4 kg)   SpO2 98%   BMI 37.08 kg/m   Physical Exam  Constitutional: She is oriented to person, place, and time. She appears well-developed and well-nourished.  Cardiovascular: Normal rate, regular rhythm and intact distal pulses.  Exam reveals no gallop and no friction rub.   No murmur heard. Pulmonary/Chest: No respiratory distress. She has no wheezes. She has no rales.  Neurological: She is alert and oriented to person, place, and time.  Skin: Skin is warm and dry. No rash noted.  Psychiatric: She has a normal mood and affect.   Assessment and Plan :   1. Urinary frequency - Hydrate adequately, cut back on caffeine drinks. Return-to-clinic precautions discussed, patient verbalized understanding.    2. Rash and nonspecific skin eruption - Resolved.  3. Essential hypertension, benign 4. Mixed hyperlipidemia - Patient's labs look great overall, counseled that she needs improvement in her LDL and HDL levels. She has a heart doctor that told her the same and plans on following up with them.  Jaynee Eagles, PA-C [facility] [phone] 10/18/2016 9:30 AM

## 2016-10-21 DIAGNOSIS — M9903 Segmental and somatic dysfunction of lumbar region: Secondary | ICD-10-CM | POA: Diagnosis not present

## 2016-10-21 DIAGNOSIS — M9901 Segmental and somatic dysfunction of cervical region: Secondary | ICD-10-CM | POA: Diagnosis not present

## 2016-10-21 DIAGNOSIS — M7061 Trochanteric bursitis, right hip: Secondary | ICD-10-CM | POA: Diagnosis not present

## 2016-10-21 DIAGNOSIS — M5030 Other cervical disc degeneration, unspecified cervical region: Secondary | ICD-10-CM | POA: Diagnosis not present

## 2016-10-21 DIAGNOSIS — M9902 Segmental and somatic dysfunction of thoracic region: Secondary | ICD-10-CM | POA: Diagnosis not present

## 2016-10-24 ENCOUNTER — Encounter: Payer: Self-pay | Admitting: Urgent Care

## 2016-11-05 DIAGNOSIS — M9901 Segmental and somatic dysfunction of cervical region: Secondary | ICD-10-CM | POA: Diagnosis not present

## 2016-11-05 DIAGNOSIS — M9903 Segmental and somatic dysfunction of lumbar region: Secondary | ICD-10-CM | POA: Diagnosis not present

## 2016-11-05 DIAGNOSIS — M9902 Segmental and somatic dysfunction of thoracic region: Secondary | ICD-10-CM | POA: Diagnosis not present

## 2016-11-05 DIAGNOSIS — M5030 Other cervical disc degeneration, unspecified cervical region: Secondary | ICD-10-CM | POA: Diagnosis not present

## 2016-11-07 ENCOUNTER — Encounter: Payer: Self-pay | Admitting: Urgent Care

## 2016-11-07 ENCOUNTER — Ambulatory Visit (INDEPENDENT_AMBULATORY_CARE_PROVIDER_SITE_OTHER): Payer: Medicare Other | Admitting: Urgent Care

## 2016-11-07 VITALS — BP 120/74 | HR 71 | Temp 98.4°F | Resp 16 | Ht 63.0 in | Wt 203.2 lb

## 2016-11-07 DIAGNOSIS — E876 Hypokalemia: Secondary | ICD-10-CM | POA: Diagnosis not present

## 2016-11-07 DIAGNOSIS — G4700 Insomnia, unspecified: Secondary | ICD-10-CM

## 2016-11-07 DIAGNOSIS — E860 Dehydration: Secondary | ICD-10-CM

## 2016-11-07 DIAGNOSIS — R350 Frequency of micturition: Secondary | ICD-10-CM | POA: Diagnosis not present

## 2016-11-07 DIAGNOSIS — R21 Rash and other nonspecific skin eruption: Secondary | ICD-10-CM | POA: Diagnosis not present

## 2016-11-07 LAB — POCT URINALYSIS DIP (MANUAL ENTRY)
BILIRUBIN UA: NEGATIVE
GLUCOSE UA: NEGATIVE mg/dL
NITRITE UA: NEGATIVE
Protein Ur, POC: NEGATIVE mg/dL
Spec Grav, UA: >=1.03 — AB (ref 1.010–1.025)
Urobilinogen, UA: 0.2 E.U./dL
pH, UA: 5.5 (ref 5.0–8.0)

## 2016-11-07 MED ORDER — TRAZODONE HCL 50 MG PO TABS
50.0000 mg | ORAL_TABLET | Freq: Every day | ORAL | 0 refills | Status: DC
Start: 2016-11-07 — End: 2017-01-13

## 2016-11-07 MED ORDER — TRIAMCINOLONE ACETONIDE 0.1 % EX CREA: 1.0000 "application " | TOPICAL_CREAM | Freq: Two times a day (BID) | CUTANEOUS | 0 refills | Status: DC

## 2016-11-07 MED ORDER — HYDROXYZINE HCL 50 MG PO TABS: 100.0000 mg | ORAL_TABLET | Freq: Every evening | ORAL | 0 refills | Status: DC | PRN

## 2016-11-07 NOTE — Patient Instructions (Addendum)
Urinary Frequency, Adult Urinary frequency means urinating more often than usual. People with urinary frequency urinate at least 8 times in 24 hours, even if they drink a normal amount of fluid. Although they urinate more often than normal, the total amount of urine produced in a day may be normal. Urinary frequency is also called pollakiuria. What are the causes? This condition may be caused by:  A urinary tract infection.  Obesity.  Bladder problems, such as bladder stones.  Caffeine or alcohol.  Eating food or drinking fluids that irritate the bladder. These include coffee, tea, soda, artificial sweeteners, citrus, tomato-based foods, and chocolate.  Certain medicines, such as medicines that help the body get rid of extra fluid (diuretics).  Muscle or nerve weakness.  Overactive bladder.  Chronic diabetes.  Interstitial cystitis.  In men, problems with the prostate, such as an enlarged prostate.  In women, pregnancy.  In some cases, the cause may not be known. What increases the risk? This condition is more likely to develop in:  Women who have gone through menopause.  Men with prostate problems.  People with a disease or injury that affects the nerves or spinal cord.  People who have or have had a condition that affects the brain, such as a stroke.  What are the signs or symptoms? Symptoms of this condition include:  Feeling an urgent need to urinate often. The stress and anxiety of needing to find a bathroom quickly can make this urge worse.  Urinating 8 or more times in 24 hours.  Urinating as often as every 1 to 2 hours.  How is this diagnosed? This condition is diagnosed based on your symptoms, your medical history, and a physical exam. You may have tests, such as:  Blood tests.  Urine tests.  Imaging tests, such as X-rays or ultrasounds.  A bladder test.  A test of your neurological system. This is the body system that senses the need to  urinate.  A test to check for problems in the urethra and bladder called cystoscopy.  You may also be asked to keep a bladder diary. A bladder diary is a record of what you eat and drink, how often you urinate, and how much you urinate. You may need to see a health care provider who specializes in conditions of the urinary tract (urologist) or kidneys (nephrologist). How is this treated? Treatment for this condition depends on the cause. Sometimes the condition goes away on its own and treatment is not necessary. If treatment is needed, it may include:  Taking medicine.  Learning exercises that strengthen the muscles that help control urination.  Following a bladder training program. This may include: ? Learning to delay going to the bathroom. ? Double urinating (voiding). This helps if you are not completely emptying your bladder. ? Scheduled voiding.  Making diet changes, such as: ? Avoiding caffeine. ? Drinking fewer fluids, especially alcohol. ? Not drinking in the evening. ? Not having foods or drinks that may irritate the bladder. ? Eating foods that help prevent or ease constipation. Constipation can make this condition worse.  Having the nerves in your bladder stimulated. There are two options for stimulating the nerves to your bladder: ? Outpatient electrical nerve stimulation. This is done by your health care provider. ? Surgery to implant a bladder pacemaker. The pacemaker helps to control the urge to urinate.  Follow these instructions at home:  Keep a bladder diary if told to by your health care provider.  Take over-the-counter   and prescription medicines only as told by your health care provider.  Do any exercises as told by your health care provider.  Follow a bladder training program as told by your health care provider.  Make any recommended diet changes.  Keep all follow-up visits as told by your health care provider. This is important. Contact a health care  provider if:  You start urinating more often.  You feel pain or irritation when you urinate.  You notice blood in your urine.  Your urine looks cloudy.  You develop a fever.  You begin vomiting. Get help right away if:  You are unable to urinate. This information is not intended to replace advice given to you by your health care provider. Make sure you discuss any questions you have with your health care provider. Document Released: 10/27/2008 Document Revised: 02/01/2015 Document Reviewed: 07/27/2014 Elsevier Interactive Patient Education  2018 [employer].    Dehydration, Adult Dehydration is a condition in which there is not enough fluid or water in the body. This happens when you lose more fluids than you take in. Important organs, such as the kidneys, brain, and heart, cannot function without a proper amount of fluids. Any loss of fluids from the body can lead to dehydration. Dehydration can range from mild to severe. This condition should be treated right away to prevent it from becoming severe. What are the causes? This condition may be caused by:  Vomiting.  Diarrhea.  Excessive sweating, such as from heat exposure or exercise.  Not drinking enough fluid, especially: ? When ill. ? While doing activity that requires a lot of energy.  Excessive urination.  Fever.  Infection.  Certain medicines, such as medicines that cause the body to lose excess fluid (diuretics).  Inability to access safe drinking water.  Reduced physical ability to get adequate water and food.  What increases the risk? This condition is more likely to develop in people:  Who have a poorly controlled long-term (chronic) illness, such as diabetes, heart disease, or kidney disease.  Who are age 23 or older.  Who are disabled.  Who live in a place with high altitude.  Who play endurance sports.  What are the signs or symptoms? Symptoms of mild dehydration may  include:  Thirst.  Dry lips.  Slightly dry mouth.  Dry, warm skin.  Dizziness. Symptoms of moderate dehydration may include:  Very dry mouth.  Muscle cramps.  Dark urine. Urine may be the color of tea.  Decreased urine production.  Decreased tear production.  Heartbeat that is irregular or faster than normal (palpitations).  Headache.  Light-headedness, especially when you stand up from a sitting position.  Fainting (syncope). Symptoms of severe dehydration may include:  Changes in skin, such as: ? Cold and clammy skin. ? Blotchy (mottled) or pale skin. ? Skin that does not quickly return to normal after being lightly pinched and released (poor skin turgor).  Changes in body fluids, such as: ? Extreme thirst. ? No tear production. ? Inability to sweat when body temperature is high, such as in hot weather. ? Very little urine production.  Changes in vital signs, such as: ? Weak pulse. ? Pulse that is more than 100 beats a minute when sitting still. ? Rapid breathing. ? Low blood pressure.  Other changes, such as: ? Sunken eyes. ? Cold hands and feet. ? Confusion. ? Lack of energy (lethargy). ? Difficulty waking up from sleep. ? Short-term weight loss. ? Unconsciousness. How is this diagnosed?  This condition is diagnosed based on your symptoms and a physical exam. Blood and urine tests may be done to help confirm the diagnosis. How is this treated? Treatment for this condition depends on the severity. Mild or moderate dehydration can often be treated at home. Treatment should be started right away. Do not wait until dehydration becomes severe. Severe dehydration is an emergency and it needs to be treated in a hospital. Treatment for mild dehydration may include:  Drinking more fluids.  Replacing salts and minerals in your blood (electrolytes) that you may have lost. Treatment for moderate dehydration may include:  Drinking an oral rehydration solution  (ORS). This is a drink that helps you replace fluids and electrolytes (rehydrate). It can be found at pharmacies and retail stores. Treatment for severe dehydration may include:  Receiving fluids through an IV tube.  Receiving an electrolyte solution through a feeding tube that is passed through your nose and into your stomach (nasogastric tube, or NG tube).  Correcting any abnormalities in electrolytes.  Treating the underlying cause of dehydration. Follow these instructions at home:  If directed by your health care provider, drink an ORS: ? Make an ORS by following instructions on the package. ? Start by drinking small amounts, about  cup (120 mL) every 5-10 minutes. ? Slowly increase how much you drink until you have taken the amount recommended by your health care provider.  Drink enough clear fluid to keep your urine clear or pale yellow. If you were told to drink an ORS, finish the ORS first, then start slowly drinking other clear fluids. Drink fluids such as: ? Water. Do not drink only water. Doing that can lead to having too little salt (sodium) in the body (hyponatremia). ? Ice chips. ? Fruit juice that you have added water to (diluted fruit juice). ? Low-calorie sports drinks.  Avoid: ? Alcohol. ? Drinks that contain a lot of sugar. These include high-calorie sports drinks, fruit juice that is not diluted, and soda. ? Caffeine. ? Foods that are greasy or contain a lot of fat or sugar.  Take over-the-counter and prescription medicines only as told by your health care provider.  Do not take sodium tablets. This can lead to having too much sodium in the body (hypernatremia).  Eat foods that contain a healthy balance of electrolytes, such as bananas, oranges, potatoes, tomatoes, and spinach.  Keep all follow-up visits as told by your health care provider. This is important. Contact a health care provider if:  You have abdominal pain that: ? Gets worse. ? Stays in one  area (localizes).  You have a rash.  You have a stiff neck.  You are more irritable than usual.  You are sleepier or more difficult to wake up than usual.  You feel weak or dizzy.  You feel very thirsty.  You have urinated only a small amount of very dark urine over 6-8 hours. Get help right away if:  You have symptoms of severe dehydration.  You cannot drink fluids without vomiting.  Your symptoms get worse with treatment.  You have a fever.  You have a severe headache.  You have vomiting or diarrhea that: ? Gets worse. ? Does not go away.  You have blood or green matter (bile) in your vomit.  You have blood in your stool. This may cause stool to look black and tarry.  You have not urinated in 6-8 hours.  You faint.  Your heart rate while sitting still is over 100  beats a minute.  You have trouble breathing. This information is not intended to replace advice given to you by your health care provider. Make sure you discuss any questions you have with your health care provider. Document Released: 12/31/2004 Document Revised: 07/28/2015 Document Reviewed: 02/24/2015 Elsevier Interactive Patient Education  2018 [employer].   IF you received an x-ray today, you will receive an invoice from [facility]. Please contact [facility] at [phone] with questions or concerns regarding your invoice.   IF you received labwork today, you will receive an invoice from [place]. Please contact [facility] at [phone] with questions or concerns regarding your invoice.   Our billing staff will not be able to assist you with questions regarding bills from these companies.  You will be contacted with the lab results as soon as they are available. The fastest way to get your results is to activate your My Chart account. Instructions are located on the last page of this paperwork. If you have not heard from [place] regarding the results in 2 weeks, please contact  this office.

## 2016-11-07 NOTE — Progress Notes (Signed)
MRN: [id number] DOB: 1935/06/11  Subjective:   Kathleen Pacheco is a 81 y.o. female presenting for follow up on insomnia, urinary frequency, medication refill.  Insomnia - At last OV patient was started on Vistaril. Reports that her insomnia is not improved, is getting ~4-5 hours of sleep per night.   Urinary frequency - Urine culture and urinalysis have been negative, last OV was 10/18/2016. She was advised to hydrate better and cut back on caffeine drinks. Today, reports that she is peeing more than 5 times per day, cannot hold her urine but denies incontinence. Denies fever, dysuria, hematuria, cloudy urine. She is down to 1 cup of green tea per day. She is not hydrating very well.   Medication refill - Patient had a rash over her right lower leg that was resolved with Kenalog cream. She is requesting a refill today. Admits that she has been using this daily with good improvement but still has residual itching and dryness.  Lab review from 09/24/2016 showed that she had 3.7 for her K+.  Kathleen Pacheco has a current medication list which includes the following prescription(s): albuterol, beano, amlodipine, aspirin ec, b-complex with vitamin c, cetirizine, hydrochlorothiazide, hydroxyzine, multivitamin gummies adult, potassium chloride sa, tramadol, triamcinolone cream, turmeric, and phenazopyridine. Also is allergic to ace inhibitors; other; and penicillins.  Kathleen Pacheco  has a past medical history of Bradycardia; Gout; bladder infections; Hyperlipidemia; Hypertension; Migraine; Thyroid disease; and Vertigo. Also  has a past surgical history that includes Abdominal hysterectomy; Parathyroidectomy; Gallbladder surgery; and Cholecystectomy.  Objective:   Vitals: BP 120/74   Pulse 71   Temp 98.4 F (36.9 C) (Oral)   Resp 16   Ht 5\' 3"  (1.6 m)   Wt 203 lb 3.2 oz (92.2 kg)   SpO2 98%   BMI 36.00 kg/m   Physical Exam  Constitutional: She is oriented to person, place, and time. She appears  well-developed and well-nourished.  Cardiovascular: Normal rate, regular rhythm and intact distal pulses.  Exam reveals no gallop and no friction rub.   No murmur heard. Pulmonary/Chest: No respiratory distress. She has no wheezes. She has no rales.  Neurological: She is alert and oriented to person, place, and time.  Skin: Skin is warm and dry. Rash (dry patches over right lower leg ranging from 1/2cm - 2cm) noted.   Results for orders placed or performed in visit on 11/07/16 (from the past 24 hour(s))  POCT urinalysis dipstick     Status: Abnormal   Collection Time: 11/07/16 11:18 AM  Result Value Ref Range   Color, UA yellow yellow   Clarity, UA cloudy (A) clear   Glucose, UA negative negative mg/dL   Bilirubin, UA negative negative   Ketones, POC UA trace (5) (A) negative mg/dL   Spec Grav, UA >=1.030 (A) 1.010 - 1.025   Blood, UA trace-lysed (A) negative   pH, UA 5.5 5.0 - 8.0   Protein Ur, POC negative negative mg/dL   Urobilinogen, UA 0.2 0.2 or 1.0 E.U./dL   Nitrite, UA Negative Negative   Leukocytes, UA Small (1+) (A) Negative    Assessment and Plan :   1. Urinary frequency 2. Dehydration - Counseled on adequate hydration again. Urine culture pending. Return-to-clinic precautions discussed, patient verbalized understanding.   3. Insomnia, unspecified type - Sleep hygiene reviewed. Patient is requesting stronger dose of sleep medication. Will increase hydroxyzine dose to 100mg . Counseled patient on potential for adverse effects with medications prescribed today, patient verbalized understanding. If this does not  work, patient will try trazodone at 50mg  nightly.  4. Hypokalemia - Stable per labs from 09/24/2016.  5. Rash and nonspecific skin eruption - Stop Kenalog for 2 weeks, resume as needed. Hydrate as above.  Kathleen Eagles, PA-C [facility] [phone] 11/07/2016 10:58 AM

## 2016-11-08 LAB — URINE CULTURE

## 2016-11-14 ENCOUNTER — Ambulatory Visit (INDEPENDENT_AMBULATORY_CARE_PROVIDER_SITE_OTHER): Payer: Medicare Other | Admitting: Emergency Medicine

## 2016-11-14 ENCOUNTER — Encounter: Payer: Self-pay | Admitting: Emergency Medicine

## 2016-11-14 VITALS — BP 110/80 | HR 72 | Temp 98.1°F | Resp 16 | Ht 63.0 in | Wt 202.8 lb

## 2016-11-14 DIAGNOSIS — R5383 Other fatigue: Secondary | ICD-10-CM

## 2016-11-14 DIAGNOSIS — J22 Unspecified acute lower respiratory infection: Secondary | ICD-10-CM | POA: Diagnosis not present

## 2016-11-14 DIAGNOSIS — R05 Cough: Secondary | ICD-10-CM

## 2016-11-14 DIAGNOSIS — R0989 Other specified symptoms and signs involving the circulatory and respiratory systems: Secondary | ICD-10-CM | POA: Diagnosis not present

## 2016-11-14 DIAGNOSIS — R059 Cough, unspecified: Secondary | ICD-10-CM

## 2016-11-14 LAB — CBC WITH DIFFERENTIAL/PLATELET
BASOS ABS: 0 10*3/uL (ref 0.0–0.2)
Basos: 1 %
EOS (ABSOLUTE): 0.3 10*3/uL (ref 0.0–0.4)
Eos: 7 %
HEMOGLOBIN: 14.1 g/dL (ref 11.1–15.9)
Hematocrit: 44.2 % (ref 34.0–46.6)
Immature Grans (Abs): 0 10*3/uL (ref 0.0–0.1)
Immature Granulocytes: 0 %
LYMPHS ABS: 2.4 10*3/uL (ref 0.7–3.1)
LYMPHS: 52 %
MCH: 26.8 pg (ref 26.6–33.0)
MCHC: 31.9 g/dL (ref 31.5–35.7)
MCV: 84 fL (ref 79–97)
Monocytes Absolute: 0.3 10*3/uL (ref 0.1–0.9)
Monocytes: 6 %
NEUTROS ABS: 1.6 10*3/uL (ref 1.4–7.0)
NEUTROS PCT: 34 %
PLATELETS: 235 10*3/uL (ref 150–379)
RBC: 5.26 x10E6/uL (ref 3.77–5.28)
RDW: 15.2 % (ref 12.3–15.4)
WBC: 4.7 10*3/uL (ref 3.4–10.8)

## 2016-11-14 LAB — COMPREHENSIVE METABOLIC PANEL
A/G RATIO: 1.4 (ref 1.2–2.2)
ALBUMIN: 4.2 g/dL (ref 3.5–4.8)
ALK PHOS: 96 IU/L (ref 39–117)
ALT: 19 IU/L (ref 0–32)
AST: 18 IU/L (ref 0–40)
BILIRUBIN TOTAL: 0.6 mg/dL (ref 0.0–1.2)
BUN / CREAT RATIO: 9 — AB (ref 12–28)
BUN: 9 mg/dL (ref 8–27)
CHLORIDE: 101 mmol/L (ref 96–106)
CO2: 24 mmol/L (ref 20–29)
Calcium: 9.6 mg/dL (ref 8.7–10.3)
Creatinine, Ser: 0.96 mg/dL (ref 0.57–1.00)
GFR calc Af Amer: 65 mL/min/{1.73_m2} (ref >59)
GFR calc non Af Amer: 56 mL/min/{1.73_m2} — ABNORMAL LOW (ref >59)
GLUCOSE: 98 mg/dL (ref 65–99)
Globulin, Total: 3 g/dL (ref 1.5–4.5)
POTASSIUM: 4.1 mmol/L (ref 3.5–5.2)
Sodium: 138 mmol/L (ref 134–144)
Total Protein: 7.2 g/dL (ref 6.0–8.5)

## 2016-11-14 MED ORDER — PROMETHAZINE-CODEINE 6.25-10 MG/5ML PO SYRP: 5.0000 mL | ORAL_SOLUTION | Freq: Every evening | ORAL | 0 refills | Status: DC | PRN

## 2016-11-14 MED ORDER — AZITHROMYCIN 250 MG PO TABS: ORAL_TABLET | ORAL | 0 refills | Status: DC

## 2016-11-14 NOTE — Progress Notes (Signed)
Kathleen Pacheco 81 y.o.   Chief Complaint  Patient presents with  . Cough    X 2 WEEKS  . Fatigue    X2 WEEKS    HISTORY OF PRESENT ILLNESS: This is a 81 y.o. female complaining of cough, congestion, and fatigue since travelling [place] 2 weeks ago.  Cough  This is a new problem. The current episode started 1 to 4 weeks ago. The problem has been gradually worsening. The problem occurs constantly. The cough is productive of sputum. Pertinent negatives include no chest pain, chills, ear pain, eye redness, fever, headaches, hemoptysis, myalgias, nasal congestion, rash, rhinorrhea, sore throat, shortness of breath or wheezing. Risk factors for lung disease include travel. She has tried OTC cough suppressant (Zyrtec and cough drops) for the symptoms.     Prior to Admission medications   Medication Sig Start Date End Date Taking? Authorizing Provider  Alpha-D-Galactosidase Satira Mccallum) TABS Take 2 tablets by mouth every 6 (six) hours as needed (for gas).   Yes [provider]  amLODipine (NORVASC) 5 MG tablet Take 5 mg by mouth daily.   Yes [provider]  aspirin EC 81 MG tablet Take 81 mg by mouth daily.   Yes [provider]  atorvastatin (LIPITOR) 40 MG tablet Take 40 mg by mouth daily.   Yes [provider]  B Complex-C (B-COMPLEX WITH VITAMIN C) tablet Take 1 tablet by mouth daily.   Yes [provider]  cetirizine (ZYRTEC) 10 MG tablet Take 10 mg by mouth daily as needed for allergies.    Yes [provider]  hydrochlorothiazide (HYDRODIURIL) 25 MG tablet Take 25 mg by mouth daily.   Yes [provider]  hydrOXYzine (ATARAX/VISTARIL) 50 MG tablet Take 2 tablets (100 mg total) by mouth at bedtime as needed (insomnia). 11/07/16  Yes Jaynee Eagles, PA-C  Multiple Vitamins-Minerals (MULTIVITAMIN GUMMIES ADULT) [place] 3 each by mouth daily.   Yes [provider]  potassium chloride SA (K-DUR,KLOR-CON) 20 MEQ tablet Take 20  mEq by mouth daily.   Yes [provider]  traMADol (ULTRAM) 50 MG tablet Take 50 mg by mouth every 6 (six) hours as needed for moderate pain.   Yes [provider]  traZODone (DESYREL) 50 MG tablet Take 1 tablet (50 mg total) by mouth at bedtime. 11/07/16  Yes Jaynee Eagles, PA-C  triamcinolone cream (KENALOG) 0.1 % Apply 1 application topically 2 (two) times daily. 11/07/16  Yes Jaynee Eagles, PA-C  TURMERIC PO Take 5 mLs by mouth daily.   Yes [provider]  albuterol (PROVENTIL HFA;VENTOLIN HFA) 108 (90 Base) MCG/ACT inhaler Inhale 2 puffs into the lungs every 6 (six) hours as needed for wheezing or shortness of breath. Patient not taking: Reported on 11/14/2016 04/05/16   Forrest Moron, MD  phenazopyridine (PYRIDIUM) 100 MG tablet Take 1 tablet (100 mg total) by mouth 3 (three) times daily as needed for pain. Patient not taking: Reported on 11/07/2016 10/15/16   Jaynee Eagles, PA-C    Allergies  Allergen Reactions  . Ace Inhibitors Other (See Comments)    Reaction:  Unknown   . Other Swelling and Other (See Comments)    Pt states that she is allergic to artificial sweeteners.   Reaction:  Facial swelling   . Penicillins Other (See Comments)    Reaction:  Yeast infection  Has patient had a PCN reaction causing immediate rash, facial/tongue/throat swelling, SOB or lightheadedness with hypotension: No Has patient had a PCN reaction causing  severe rash involving mucus membranes or skin necrosis: No Has patient had a PCN reaction that required hospitalization No Has patient had a PCN reaction occurring within the last 10 years: No If all of the above answers are "NO", then may proceed with Cephalosporin use.    Patient Active Problem List   Diagnosis Date Noted  . Acute bronchitis 03/18/2016  . Osteoarthrosis, hand 10/22/2013  . DIZZINESS 02/02/2009  . HYPERCHOLESTEROLEMIA 01/25/2009  . HYPERTENSION 01/25/2009    Past Medical History:  Diagnosis Date  .  Bradycardia   . Gout   . Hx of bladder infections   . Hyperlipidemia   . Hypertension   . Migraine   . Thyroid disease   . Vertigo     Past Surgical History:  Procedure Laterality Date  . ABDOMINAL HYSTERECTOMY    . CHOLECYSTECTOMY    . GALLBLADDER SURGERY    . PARATHYROIDECTOMY      Social History   Social History  . Marital status: Divorced    Spouse name: N/A  . Number of children: N/A  . Years of education: N/A   Occupational History  . Not on file.   Social History Main Topics  . Smoking status: Never Smoker  . Smokeless tobacco: Never Used  . Alcohol use 0.6 oz/week    1 Glasses of wine per week  . Drug use: No  . Sexual activity: No     Comment: HYST    Other Topics Concern  . Not on file   Social History Narrative   Diet?      Do you drink/eat things with caffeine? Cup of macho- 1/week      Marital status?                divorced                    What year were you married? 1964      Do you live in a house, apartment, assisted living, condo, trailer, etc.? house      Is it one or more stories? one      How many persons live in your home? 1       Do you have any pets in your home? (please list) no      Current or past profession: Retired Pharmacist, hospital       Do you exercise? Work sometimes                                     Type & how often?      Do you have a living will? no      Do you have a DNR form?   no                               If not, do you want to discuss one? no      Do you have signed POA/HPOA for forms?        No family history on file.   Review of Systems  Constitutional: Positive for malaise/fatigue. Negative for chills and fever.  HENT: Negative for ear pain, nosebleeds, rhinorrhea, sinus pain and sore throat.   Eyes: Negative.  Negative for discharge and redness.  Respiratory: Positive for cough and sputum production. Negative for hemoptysis, shortness of breath and wheezing.   Cardiovascular: Negative for  chest pain,  palpitations and leg swelling.  Gastrointestinal: Negative for abdominal pain, diarrhea, nausea and vomiting.  Genitourinary: Negative.  Negative for dysuria and hematuria.  Musculoskeletal: Negative for myalgias.  Skin: Negative for rash.  Neurological: Positive for weakness. Negative for dizziness and headaches.  Endo/Heme/Allergies: Negative.   All other systems reviewed and are negative.    Vitals:   11/14/16 1039  BP: 110/80  Pulse: 72  Resp: 16  Temp: 98.1 F (36.7 C)  SpO2: 100%     Physical Exam  Constitutional: She is oriented to person, place, and time. She appears well-developed and well-nourished.  HENT:  Head: Normocephalic and atraumatic.  Right Ear: External ear normal.  Left Ear: External ear normal.  Nose: Nose normal.  Mouth/Throat: Oropharynx is clear and moist.  Eyes: Pupils are equal, round, and reactive to light. Conjunctivae and EOM are normal.  Neck: Normal range of motion. Neck supple. No JVD present.  Cardiovascular: Normal rate, regular rhythm and normal heart sounds.   Pulmonary/Chest: Effort normal and breath sounds normal.  Abdominal: Soft. She exhibits no distension. There is no tenderness.  Musculoskeletal: Normal range of motion.  Lymphadenopathy:    She has no cervical adenopathy.  Neurological: She is alert and oriented to person, place, and time. No sensory deficit. She exhibits normal muscle tone.  Skin: Skin is warm and dry. No rash noted.  Psychiatric: She has a normal mood and affect. Her behavior is normal.  Vitals reviewed.    ASSESSMENT & PLAN: Jamaris was seen today for cough and fatigue.  Diagnoses and all orders for this visit:  Lower resp. tract infection  Cough  Chest congestion  Fatigue, unspecified type -     CBC with Differential/Platelet -     Comprehensive metabolic panel  Other orders -     promethazine-codeine (PHENERGAN WITH CODEINE) 6.25-10 MG/5ML syrup; Take 5 mLs by mouth at bedtime as needed for  cough. -     azithromycin (ZITHROMAX) 250 MG tablet; Sig as indicated    Patient Instructions       IF you received an x-ray today, you will receive an invoice from [facility]. Please contact [facility] at [phone] with questions or concerns regarding your invoice.   IF you received labwork today, you will receive an invoice from [facility]. Please contact [facility] at [phone] with questions or concerns regarding your invoice.   Our billing staff will not be able to assist you with questions regarding bills from these companies.  You will be contacted with the lab results as soon as they are available. The fastest way to get your results is to activate your My Chart account. Instructions are located on the last page of this paperwork. If you have not heard from [place] regarding the results in 2 weeks, please contact this office.     Acute Bronchitis, Adult Acute bronchitis is when air tubes (bronchi) in the lungs suddenly get swollen. The condition can make it hard to breathe. It can also cause these symptoms:  A cough.  Coughing up clear, yellow, or green mucus.  Wheezing.  Chest congestion.  Shortness of breath.  A fever.  Body aches.  Chills.  A sore throat.  Follow these instructions at home: Medicines  Take over-the-counter and prescription medicines only as told by your doctor.  If you were prescribed an antibiotic medicine, take it as told by your doctor. Do not stop taking the antibiotic even if you start to feel better. General instructions  Rest.  Drink enough fluids to keep your pee (urine) clear or pale yellow.  Avoid smoking and secondhand smoke. If you smoke and you need help quitting, ask your doctor. Quitting will help your lungs heal faster.  Use an inhaler, cool mist vaporizer, or humidifier as told by your doctor.  Keep all follow-up visits as told by your doctor. This is important. How is this prevented? To lower  your risk of getting this condition again:  Wash your hands often with soap and water. If you cannot use soap and water, use hand sanitizer.  Avoid contact with people who have cold symptoms.  Try not to touch your hands to your mouth, nose, or eyes.  Make sure to get the flu shot every year.  Contact a doctor if:  Your symptoms do not get better in 2 weeks. Get help right away if:  You cough up blood.  You have chest pain.  You have very bad shortness of breath.  You become dehydrated.  You faint (pass out) or keep feeling like you are going to pass out.  You keep throwing up (vomiting).  You have a very bad headache.  Your fever or chills gets worse. This information is not intended to replace advice given to you by your health care provider. Make sure you discuss any questions you have with your health care provider. Document Released: 06/19/2007 Document Revised: 08/09/2015 Document Reviewed: 06/21/2015 Elsevier Interactive Patient Education  2017 Elsevier Inc.      Agustina Caroli, MD [facility]

## 2016-11-14 NOTE — Patient Instructions (Addendum)
     IF you received an x-ray today, you will receive an invoice from [facility]. Please contact [facility] at [phone] with questions or concerns regarding your invoice.   IF you received labwork today, you will receive an invoice from [facility]. Please contact [facility] at [phone] with questions or concerns regarding your invoice.   Our billing staff will not be able to assist you with questions regarding bills from these companies.  You will be contacted with the lab results as soon as they are available. The fastest way to get your results is to activate your My Chart account. Instructions are located on the last page of this paperwork. If you have not heard from us regarding the results in 2 weeks, please contact this office.      Acute Bronchitis, Adult Acute bronchitis is when air tubes (bronchi) in the lungs suddenly get swollen. The condition can make it hard to breathe. It can also cause these symptoms:  A cough.  Coughing up clear, yellow, or green mucus.  Wheezing.  Chest congestion.  Shortness of breath.  A fever.  Body aches.  Chills.  A sore throat.  Follow these instructions at home: Medicines  Take over-the-counter and prescription medicines only as told by your doctor.  If you were prescribed an antibiotic medicine, take it as told by your doctor. Do not stop taking the antibiotic even if you start to feel better. General instructions  Rest.  Drink enough fluids to keep your pee (urine) clear or pale yellow.  Avoid smoking and secondhand smoke. If you smoke and you need help quitting, ask your doctor. Quitting will help your lungs heal faster.  Use an inhaler, cool mist vaporizer, or humidifier as told by your doctor.  Keep all follow-up visits as told by your doctor. This is important. How is this prevented? To lower your risk of getting this condition again:  Wash your hands often with soap and water. If you cannot  use soap and water, use hand sanitizer.  Avoid contact with people who have cold symptoms.  Try not to touch your hands to your mouth, nose, or eyes.  Make sure to get the flu shot every year.  Contact a doctor if:  Your symptoms do not get better in 2 weeks. Get help right away if:  You cough up blood.  You have chest pain.  You have very bad shortness of breath.  You become dehydrated.  You faint (pass out) or keep feeling like you are going to pass out.  You keep throwing up (vomiting).  You have a very bad headache.  Your fever or chills gets worse. This information is not intended to replace advice given to you by your health care provider. Make sure you discuss any questions you have with your health care provider. Document Released: 06/19/2007 Document Revised: 08/09/2015 Document Reviewed: 06/21/2015 Elsevier Interactive Patient Education  2017 Elsevier Inc.  

## 2016-12-09 ENCOUNTER — Other Ambulatory Visit: Payer: Self-pay | Admitting: Urgent Care

## 2016-12-13 DIAGNOSIS — M9902 Segmental and somatic dysfunction of thoracic region: Secondary | ICD-10-CM | POA: Diagnosis not present

## 2016-12-13 DIAGNOSIS — M9901 Segmental and somatic dysfunction of cervical region: Secondary | ICD-10-CM | POA: Diagnosis not present

## 2016-12-13 DIAGNOSIS — M9903 Segmental and somatic dysfunction of lumbar region: Secondary | ICD-10-CM | POA: Diagnosis not present

## 2016-12-13 DIAGNOSIS — M5030 Other cervical disc degeneration, unspecified cervical region: Secondary | ICD-10-CM | POA: Diagnosis not present

## 2016-12-31 ENCOUNTER — Ambulatory Visit: Payer: Self-pay

## 2016-12-31 NOTE — Telephone Encounter (Signed)
Pt called to cancel appointment and stated that dizziness was one of the reasons of canceling. Agent transferred to NT for triage. She states that she is feeling weak and "overtired". States she has been overdoing it lately and Christmas shopping. Denies vertigo or room spinning. Pt denies chest pain, fever, vomiting, diarrhea, bleeding.  She stated it happened at 10 am today. Currently pt stated that she is seeing cardiologist Thursday and will talk to him about it. She was advised to call if she worsens.   Reason for Disposition . [1] MILD dizziness (e.g., walking normally) AND [2] has NOT been evaluated by physician for this  (Exception: dizziness caused by heat exposure, sudden standing, or poor fluid intake)  Answer Assessment - Initial Assessment Questions 1. DESCRIPTION: "Describe your dizziness."     Pt states feeling weak 2. LIGHTHEADED: "Do you feel lightheaded?" (e.g., somewhat faint, woozy, weak upon standing)     Weak and lightheaded upon standing 3. VERTIGO: "Do you feel like either you or the room is spinning or tilting?" (i.e. vertigo)     no 4. SEVERITY: "How bad is it?"  "Do you feel like you are going to faint?" "Can you stand and walk?"   - MILD - walking normally   - MODERATE - interferes with normal activities (e.g., work, school)    - SEVERE - unable to stand, requires support to walk, feels like passing out now.      Feels like she has no energy now. Moderate  5. ONSET:  "When did the dizziness begin?"     10 am today 6. AGGRAVATING FACTORS: "Does anything make it worse?" (e.g., standing, change in head position)     No nothing makes it worse 7. HEART RATE: "Can you tell me your heart rate?" "How many beats in 15 seconds?"  (Note: not all patients can do this)       Felt like she was having heart palpitations. Beating a little faster than usual 8. CAUSE: "What do you think is causing the dizziness?"     Overly tired 9. RECURRENT SYMPTOM: "Have you had dizziness  before?" If so, ask: "When was the last time?" "What happened that time?"     Yes the last time was back in October 10. OTHER SYMPTOMS: "Do you have any other symptoms?" (e.g., fever, chest pain, vomiting, diarrhea, bleeding)       No fever, chest pain, vomiting diarrhea or bleeding 11. PREGNANCY: "Is there any chance you are pregnant?" "When was your last menstrual period?"       n/a  Protocols used: DIZZINESS [facility]

## 2017-01-01 ENCOUNTER — Ambulatory Visit: Payer: Medicare Other | Admitting: Emergency Medicine

## 2017-01-02 DIAGNOSIS — I361 Nonrheumatic tricuspid (valve) insufficiency: Secondary | ICD-10-CM | POA: Diagnosis not present

## 2017-01-02 DIAGNOSIS — I25118 Atherosclerotic heart disease of native coronary artery with other forms of angina pectoris: Secondary | ICD-10-CM | POA: Diagnosis not present

## 2017-01-02 DIAGNOSIS — I10 Essential (primary) hypertension: Secondary | ICD-10-CM | POA: Diagnosis not present

## 2017-01-02 DIAGNOSIS — E785 Hyperlipidemia, unspecified: Secondary | ICD-10-CM | POA: Diagnosis not present

## 2017-01-02 DIAGNOSIS — M1990 Unspecified osteoarthritis, unspecified site: Secondary | ICD-10-CM | POA: Diagnosis not present

## 2017-01-02 DIAGNOSIS — R7309 Other abnormal glucose: Secondary | ICD-10-CM | POA: Diagnosis not present

## 2017-01-07 DIAGNOSIS — R51 Headache: Secondary | ICD-10-CM | POA: Diagnosis not present

## 2017-01-07 DIAGNOSIS — S199XXA Unspecified injury of neck, initial encounter: Secondary | ICD-10-CM | POA: Diagnosis not present

## 2017-01-07 DIAGNOSIS — M542 Cervicalgia: Secondary | ICD-10-CM | POA: Diagnosis not present

## 2017-01-07 DIAGNOSIS — M25512 Pain in left shoulder: Secondary | ICD-10-CM | POA: Diagnosis not present

## 2017-01-07 DIAGNOSIS — M62838 Other muscle spasm: Secondary | ICD-10-CM | POA: Diagnosis not present

## 2017-01-07 DIAGNOSIS — I10 Essential (primary) hypertension: Secondary | ICD-10-CM | POA: Diagnosis not present

## 2017-01-13 ENCOUNTER — Encounter: Payer: Self-pay | Admitting: Emergency Medicine

## 2017-01-13 ENCOUNTER — Other Ambulatory Visit: Payer: Self-pay

## 2017-01-13 ENCOUNTER — Ambulatory Visit: Payer: Medicare Other | Admitting: Emergency Medicine

## 2017-01-13 ENCOUNTER — Ambulatory Visit (INDEPENDENT_AMBULATORY_CARE_PROVIDER_SITE_OTHER): Payer: Medicare Other | Admitting: Emergency Medicine

## 2017-01-13 VITALS — BP 126/76 | HR 65 | Temp 97.7°F | Resp 16 | Ht 64.0 in | Wt 201.4 lb

## 2017-01-13 DIAGNOSIS — M1991 Primary osteoarthritis, unspecified site: Secondary | ICD-10-CM | POA: Diagnosis not present

## 2017-01-13 DIAGNOSIS — G4700 Insomnia, unspecified: Secondary | ICD-10-CM

## 2017-01-13 DIAGNOSIS — R21 Rash and other nonspecific skin eruption: Secondary | ICD-10-CM | POA: Diagnosis not present

## 2017-01-13 DIAGNOSIS — M542 Cervicalgia: Secondary | ICD-10-CM | POA: Diagnosis not present

## 2017-01-13 MED ORDER — TRAZODONE HCL 50 MG PO TABS: 50.0000 mg | ORAL_TABLET | Freq: Every day | ORAL | 1 refills | Status: DC

## 2017-01-13 NOTE — Progress Notes (Signed)
Kathleen Pacheco 81 y.o.   Chief Complaint  Patient presents with  . Follow-up    RASH on legs  . Medication Refill    TRAZODONE and review lab results    HISTORY OF PRESENT ILLNESS: This is a 81 y.o. female here for follow up of rash on both legs; doing better; also needs Trazodone refill; labs reviewed with patient; all WNL.  HPI   Prior to Admission medications   Medication Sig Start Date End Date Taking? Authorizing Provider  albuterol (PROVENTIL HFA;VENTOLIN HFA) 108 (90 Base) MCG/ACT inhaler Inhale 2 puffs into the lungs every 6 (six) hours as needed for wheezing or shortness of breath. 04/05/16  Yes Stallings, Zoe A, MD  Alpha-D-Galactosidase (BEANO) TABS Take 2 tablets by mouth every 6 (six) hours as needed (for gas).   Yes [provider]  amLODipine (NORVASC) 5 MG tablet Take 5 mg by mouth daily.   Yes [provider]  aspirin EC 81 MG tablet Take 81 mg by mouth daily.   Yes [provider]  atorvastatin (LIPITOR) 40 MG tablet Take 40 mg by mouth daily.   Yes [provider]  B Complex-C (B-COMPLEX WITH VITAMIN C) tablet Take 1 tablet by mouth daily.   Yes [provider]  cetirizine (ZYRTEC) 10 MG tablet Take 10 mg by mouth daily as needed for allergies.    Yes [provider]  hydrochlorothiazide (HYDRODIURIL) 25 MG tablet Take 25 mg by mouth daily.   Yes [provider]  hydrOXYzine (ATARAX/VISTARIL) 50 MG tablet TAKE 2 TABLETS (100 MG TOTAL) BY MOUTH AT BEDTIME AS NEEDED (INSOMNIA). 12/09/16  Yes Jaynee Eagles, PA-C  Multiple Vitamins-Minerals (MULTIVITAMIN GUMMIES ADULT) [place] 3 each by mouth daily.   Yes [provider]  potassium chloride SA (K-DUR,KLOR-CON) 20 MEQ tablet Take 20 mEq by mouth daily.   Yes [provider]  traMADol (ULTRAM) 50 MG tablet Take 50 mg by mouth every 6 (six) hours as needed for moderate pain.   Yes [provider]  traZODone (DESYREL) 50 MG tablet Take  1 tablet (50 mg total) by mouth at bedtime. 11/07/16  Yes Jaynee Eagles, PA-C  triamcinolone cream (KENALOG) 0.1 % Apply 1 application topically 2 (two) times daily. 11/07/16  Yes Jaynee Eagles, PA-C  TURMERIC PO Take 5 mLs by mouth daily.   Yes [provider]  azithromycin (ZITHROMAX) 250 MG tablet Sig as indicated Patient not taking: Reported on 01/13/2017 11/14/16   Horald Pollen, MD  phenazopyridine (PYRIDIUM) 100 MG tablet Take 1 tablet (100 mg total) by mouth 3 (three) times daily as needed for pain. Patient not taking: Reported on 11/07/2016 10/15/16   Jaynee Eagles, PA-C  promethazine-codeine [facility] WITH CODEINE) 6.25-10 MG/5ML syrup Take 5 mLs by mouth at bedtime as needed for cough. Patient not taking: Reported on 01/13/2017 11/14/16   Horald Pollen, MD    Allergies  Allergen Reactions  . Ace Inhibitors Other (See Comments)    Reaction:  Unknown   . Other Swelling and Other (See Comments)    Pt states that she is allergic to artificial sweeteners.   Reaction:  Facial swelling   . Penicillins Other (See Comments)    Reaction:  Yeast infection  Has patient had a PCN reaction causing immediate rash, facial/tongue/throat swelling, SOB or lightheadedness with hypotension: No Has patient had a PCN reaction causing severe rash involving mucus membranes or skin necrosis: No Has patient had a PCN reaction that required hospitalization  No Has patient had a PCN reaction occurring within the last 10 years: No If all of the above answers are "NO", then may proceed with Cephalosporin use.    Patient Active Problem List   Diagnosis Date Noted  . Acute bronchitis 03/18/2016  . Osteoarthrosis, hand 10/22/2013  . DIZZINESS 02/02/2009  . HYPERCHOLESTEROLEMIA 01/25/2009  . HYPERTENSION 01/25/2009    Past Medical History:  Diagnosis Date  . Bradycardia   . Gout   . Hx of bladder infections   . Hyperlipidemia   . Hypertension   . Migraine   . Thyroid disease   .  Vertigo     Past Surgical History:  Procedure Laterality Date  . ABDOMINAL HYSTERECTOMY    . CHOLECYSTECTOMY    . GALLBLADDER SURGERY    . PARATHYROIDECTOMY      Social History   Socioeconomic History  . Marital status: Divorced    Spouse name: Not on file  . Number of children: Not on file  . Years of education: Not on file  . Highest education level: Not on file  Social Needs  . Financial resource strain: Not on file  . Food insecurity - worry: Not on file  . Food insecurity - inability: Not on file  . Transportation needs - medical: Not on file  . Transportation needs - non-medical: Not on file  Occupational History  . Not on file  Tobacco Use  . Smoking status: Never Smoker  . Smokeless tobacco: Never Used  Substance and Sexual Activity  . Alcohol use: Yes    Alcohol/week: 0.6 oz    Types: 1 Glasses of wine per week  . Drug use: No  . Sexual activity: No    Birth control/protection: Surgical    Comment: HYST   Other Topics Concern  . Not on file  Social History Narrative   Diet?      Do you drink/eat things with caffeine? Cup of macho- 1/week      Marital status?                divorced                    What year were you married? 1964      Do you live in a house, apartment, assisted living, condo, trailer, etc.? house      Is it one or more stories? one      How many persons live in your home? 1       Do you have any pets in your home? (please list) no      Current or past profession: Retired Pharmacist, hospital       Do you exercise? Work sometimes                                     Type & how often?      Do you have a living will? no      Do you have a DNR form?   no                               If not, do you want to discuss one? no      Do you have signed POA/HPOA for forms?     No family history on file.   Review of Systems  Constitutional: Negative for  chills and fever.  Respiratory: Negative for cough and shortness of breath.    Cardiovascular: Negative for chest pain and palpitations.  Gastrointestinal: Negative for abdominal pain, nausea and vomiting.  Genitourinary: Negative for dysuria and hematuria.  Skin: Positive for rash.  Neurological: Negative.  Negative for dizziness and headaches.  All other systems reviewed and are negative.  Vitals:   01/13/17 1450  BP: 126/76  Pulse: 65  Resp: 16  Temp: 97.7 F (36.5 C)  SpO2: 97%     Physical Exam  Constitutional: She is oriented to person, place, and time. She appears well-developed and well-nourished.  HENT:  Head: Normocephalic and atraumatic.  Eyes: EOM are normal. Pupils are equal, round, and reactive to light.  Neck: Normal range of motion. Neck supple.  Cardiovascular: Normal rate and regular rhythm.  Pulmonary/Chest: Effort normal and breath sounds normal.  Abdominal: Soft. There is no tenderness.  Musculoskeletal: Normal range of motion.  Neurological: She is alert and oriented to person, place, and time. No sensory deficit. She exhibits normal muscle tone.  Skin: Capillary refill takes less than 2 seconds.  LE: mild faint much improved rash; NVI with FROM  Psychiatric: She has a normal mood and affect. Her behavior is normal.  Vitals reviewed.    ASSESSMENT & PLAN: Romanita was seen today for follow-up and medication refill.  Diagnoses and all orders for this visit:  Rash and nonspecific skin eruption  Insomnia, unspecified type -     Discontinue: traZODone (DESYREL) 50 MG tablet; Take 1 tablet (50 mg total) by mouth at bedtime. -     traZODone (DESYREL) 50 MG tablet; Take 1 tablet (50 mg total) by mouth at bedtime.   Return as needed.   Agustina Caroli, MD [facility]

## 2017-01-13 NOTE — Patient Instructions (Signed)
     IF you received an x-ray today, you will receive an invoice from [facility]. Please contact [facility] at [phone] with questions or concerns regarding your invoice.   IF you received labwork today, you will receive an invoice from [facility]. Please contact [facility] at [phone] with questions or concerns regarding your invoice.   Our billing staff will not be able to assist you with questions regarding bills from these companies.  You will be contacted with the lab results as soon as they are available. The fastest way to get your results is to activate your My Chart account. Instructions are located on the last page of this paperwork. If you have not heard from us regarding the results in 2 weeks, please contact this office.     

## 2017-01-16 DIAGNOSIS — M1991 Primary osteoarthritis, unspecified site: Secondary | ICD-10-CM | POA: Diagnosis not present

## 2017-01-16 DIAGNOSIS — M542 Cervicalgia: Secondary | ICD-10-CM | POA: Diagnosis not present

## 2017-01-21 ENCOUNTER — Ambulatory Visit: Payer: Medicare Other | Admitting: Urgent Care

## 2017-01-21 DIAGNOSIS — M542 Cervicalgia: Secondary | ICD-10-CM | POA: Diagnosis not present

## 2017-01-21 DIAGNOSIS — M1991 Primary osteoarthritis, unspecified site: Secondary | ICD-10-CM | POA: Diagnosis not present

## 2017-01-22 ENCOUNTER — Ambulatory Visit (INDEPENDENT_AMBULATORY_CARE_PROVIDER_SITE_OTHER): Payer: Medicare Other

## 2017-01-22 ENCOUNTER — Other Ambulatory Visit: Payer: Self-pay

## 2017-01-22 ENCOUNTER — Ambulatory Visit (INDEPENDENT_AMBULATORY_CARE_PROVIDER_SITE_OTHER): Payer: Medicare Other | Admitting: Emergency Medicine

## 2017-01-22 ENCOUNTER — Encounter: Payer: Self-pay | Admitting: Emergency Medicine

## 2017-01-22 VITALS — BP 134/86 | HR 74 | Temp 98.3°F | Resp 18 | Ht 64.0 in | Wt 202.4 lb

## 2017-01-22 DIAGNOSIS — J90 Pleural effusion, not elsewhere classified: Secondary | ICD-10-CM | POA: Diagnosis not present

## 2017-01-22 DIAGNOSIS — R0789 Other chest pain: Secondary | ICD-10-CM

## 2017-01-22 DIAGNOSIS — R05 Cough: Secondary | ICD-10-CM | POA: Diagnosis not present

## 2017-01-22 DIAGNOSIS — R059 Cough, unspecified: Secondary | ICD-10-CM

## 2017-01-22 MED ORDER — BENZONATATE 200 MG PO CAPS: 200.0000 mg | ORAL_CAPSULE | Freq: Two times a day (BID) | ORAL | 0 refills | Status: DC | PRN

## 2017-01-22 NOTE — Progress Notes (Signed)
Kathleen Pacheco 82 y.o.   Chief Complaint  Patient presents with  . Back Pain    patient presents with R side back and rib pain with cough x 4 days. Patient states that she was on prednisone for muscle spasm    HISTORY OF PRESENT ILLNESS: This is a 82 y.o. female complaining of pain to right scapular area x 4 days; worse when coughing; denies trauma or any other significant symptoms.  HPI   Prior to Admission medications   Medication Sig Start Date End Date Taking? Authorizing Provider  albuterol (PROVENTIL HFA;VENTOLIN HFA) 108 (90 Base) MCG/ACT inhaler Inhale 2 puffs into the lungs every 6 (six) hours as needed for wheezing or shortness of breath. 04/05/16  Yes Stallings, Zoe A, MD  Alpha-D-Galactosidase (BEANO) TABS Take 2 tablets by mouth every 6 (six) hours as needed (for gas).   Yes [provider]  amLODipine (NORVASC) 5 MG tablet Take 5 mg by mouth daily.   Yes [provider]  aspirin EC 81 MG tablet Take 81 mg by mouth daily.   Yes [provider]  atorvastatin (LIPITOR) 40 MG tablet Take 40 mg by mouth daily.   Yes [provider]  B Complex-C (B-COMPLEX WITH VITAMIN C) tablet Take 1 tablet by mouth daily.   Yes [provider]  cetirizine (ZYRTEC) 10 MG tablet Take 10 mg by mouth daily as needed for allergies.    Yes [provider]  hydrochlorothiazide (HYDRODIURIL) 25 MG tablet Take 25 mg by mouth daily.   Yes [provider]  hydrOXYzine (ATARAX/VISTARIL) 50 MG tablet TAKE 2 TABLETS (100 MG TOTAL) BY MOUTH AT BEDTIME AS NEEDED (INSOMNIA). 12/09/16  Yes Jaynee Eagles, PA-C  Multiple Vitamins-Minerals (MULTIVITAMIN GUMMIES ADULT) [place] 3 each by mouth daily.   Yes [provider]  phenazopyridine (PYRIDIUM) 100 MG tablet Take 1 tablet (100 mg total) by mouth 3 (three) times daily as needed for pain. 10/15/16  Yes Jaynee Eagles, PA-C  potassium chloride SA (K-DUR,KLOR-CON) 20 MEQ tablet Take 20 mEq by mouth  daily.   Yes [provider]  traMADol (ULTRAM) 50 MG tablet Take 50 mg by mouth every 6 (six) hours as needed for moderate pain.   Yes [provider]  traZODone (DESYREL) 50 MG tablet Take 1 tablet (50 mg total) by mouth at bedtime. 01/13/17 04/13/17 Yes Kenton Fortin, Ines Bloomer, MD  triamcinolone cream (KENALOG) 0.1 % Apply 1 application topically 2 (two) times daily. 11/07/16  Yes Jaynee Eagles, PA-C  TURMERIC PO Take 5 mLs by mouth daily.   Yes [provider]  azithromycin (ZITHROMAX) 250 MG tablet Sig as indicated Patient not taking: Reported on 01/13/2017 11/14/16   Horald Pollen, MD  promethazine-codeine [facility] WITH CODEINE) 6.25-10 MG/5ML syrup Take 5 mLs by mouth at bedtime as needed for cough. Patient not taking: Reported on 01/13/2017 11/14/16   Horald Pollen, MD    Allergies  Allergen Reactions  . Ace Inhibitors Other (See Comments)    Reaction:  Unknown   . Other Swelling and Other (See Comments)    Pt states that she is allergic to artificial sweeteners.   Reaction:  Facial swelling   . Penicillins Other (See Comments)    Reaction:  Yeast infection  Has patient had a PCN reaction causing immediate rash, facial/tongue/throat swelling, SOB or lightheadedness with hypotension: No Has patient had a PCN reaction causing severe rash involving mucus membranes or skin necrosis: No Has patient had a PCN  reaction that required hospitalization No Has patient had a PCN reaction occurring within the last 10 years: No If all of the above answers are "NO", then may proceed with Cephalosporin use.    Patient Active Problem List   Diagnosis Date Noted  . Acute bronchitis 03/18/2016  . Osteoarthrosis, hand 10/22/2013  . DIZZINESS 02/02/2009  . HYPERCHOLESTEROLEMIA 01/25/2009  . HYPERTENSION 01/25/2009    Past Medical History:  Diagnosis Date  . Bradycardia   . Gout   . Hx of bladder infections   . Hyperlipidemia   . Hypertension   .  Migraine   . Thyroid disease   . Vertigo     Past Surgical History:  Procedure Laterality Date  . ABDOMINAL HYSTERECTOMY    . CHOLECYSTECTOMY    . GALLBLADDER SURGERY    . PARATHYROIDECTOMY      Social History   Socioeconomic History  . Marital status: Divorced    Spouse name: Not on file  . Number of children: Not on file  . Years of education: Not on file  . Highest education level: Not on file  Social Needs  . Financial resource strain: Not on file  . Food insecurity - worry: Not on file  . Food insecurity - inability: Not on file  . Transportation needs - medical: Not on file  . Transportation needs - non-medical: Not on file  Occupational History  . Not on file  Tobacco Use  . Smoking status: Never Smoker  . Smokeless tobacco: Never Used  Substance and Sexual Activity  . Alcohol use: Yes    Alcohol/week: 0.6 oz    Types: 1 Glasses of wine per week  . Drug use: No  . Sexual activity: No    Birth control/protection: Surgical    Comment: HYST   Other Topics Concern  . Not on file  Social History Narrative   Diet?      Do you drink/eat things with caffeine? Cup of macho- 1/week      Marital status?                divorced                    What year were you married? 1964      Do you live in a house, apartment, assisted living, condo, trailer, etc.? house      Is it one or more stories? one      How many persons live in your home? 1       Do you have any pets in your home? (please list) no      Current or past profession: Retired Pharmacist, hospital       Do you exercise? Work sometimes                                     Type & how often?      Do you have a living will? no      Do you have a DNR form?   no                               If not, do you want to discuss one? no      Do you have signed POA/HPOA for forms?     No family history on file.   Review of Systems  Constitutional: Negative.  Negative for chills and fever.  HENT: Negative.  Negative  for sore throat.   Respiratory: Positive for cough. Negative for hemoptysis, sputum production, shortness of breath and wheezing.   Cardiovascular: Negative.  Negative for chest pain and palpitations.  Gastrointestinal: Negative.  Negative for abdominal pain, diarrhea, nausea and vomiting.  Genitourinary: Negative.  Negative for dysuria, flank pain and hematuria.  Musculoskeletal: Positive for back pain. Negative for joint pain.  Skin: Negative.  Negative for rash.  Neurological: Negative.  Negative for dizziness, sensory change, focal weakness and headaches.  Endo/Heme/Allergies: Negative.   All other systems reviewed and are negative.   Vitals:   01/22/17 1223  BP: 134/86  Pulse: 74  Resp: 18  Temp: 98.3 F (36.8 C)  SpO2: 97%    Physical Exam  Constitutional: She is oriented to person, place, and time. She appears well-developed and well-nourished.  HENT:  Head: Normocephalic and atraumatic.  Mouth/Throat: Oropharynx is clear and moist.  Eyes: EOM are normal. Pupils are equal, round, and reactive to light.  Neck: Normal range of motion. Neck supple.  Cardiovascular: Normal rate, regular rhythm and normal heart sounds.  Pulmonary/Chest: Effort normal and breath sounds normal.  Abdominal: Soft. She exhibits no distension. There is no tenderness. There is no guarding.  Musculoskeletal: Normal range of motion.  Neurological: She is alert and oriented to person, place, and time. No sensory deficit. She exhibits normal muscle tone.  Skin: Skin is warm and dry. Capillary refill takes less than 2 seconds. No rash noted.  Psychiatric: She has a normal mood and affect. Her behavior is normal.  Vitals reviewed.  A total of 25 minutes was spent in the room with the patient, greater than 50% of which was in counseling/coordination of care.   ASSESSMENT & PLAN: Kathleen Pacheco was seen today for back pain.  Diagnoses and all orders for this visit:  Right-sided chest wall pain -     DG  Chest 2 View; Future  Cough -     benzonatate (TESSALON) 200 MG capsule; Take 1 capsule (200 mg total) by mouth 2 (two) times daily as needed for cough.    Patient Instructions       IF you received an x-ray today, you will receive an invoice from [facility]. Please contact [facility] at [phone] with questions or concerns regarding your invoice.   IF you received labwork today, you will receive an invoice from [facility]. Please contact [facility] at [phone] with questions or concerns regarding your invoice.   Our billing staff will not be able to assist you with questions regarding bills from these companies.  You will be contacted with the lab results as soon as they are available. The fastest way to get your results is to activate your My Chart account. Instructions are located on the last page of this paperwork. If you have not heard from [place] regarding the results in 2 weeks, please contact this office.     Cough, Adult A cough helps to clear your throat and lungs. A cough may last only 2-3 weeks (acute), or it may last longer than 8 weeks (chronic). Many different things can cause a cough. A cough may be a sign of an illness or another medical condition. Follow these instructions at home:  Pay attention to any changes in your cough.  Take medicines only as told by your doctor. ? If you were prescribed an antibiotic medicine, take it as told by your doctor. Do not  stop taking it even if you start to feel better. ? Talk with your doctor before you try using a cough medicine.  Drink enough fluid to keep your pee (urine) clear or pale yellow.  If the air is dry, use a cold steam vaporizer or humidifier in your home.  Stay away from things that make you cough at work or at home.  If your cough is worse at night, try using extra pillows to raise your head up higher while you sleep.  Do not smoke, and try not to be around smoke. If you need help  quitting, ask your doctor.  Do not have caffeine.  Do not drink alcohol.  Rest as needed. Contact a doctor if:  You have new problems (symptoms).  You cough up yellow fluid (pus).  Your cough does not get better after 2-3 weeks, or your cough gets worse.  Medicine does not help your cough and you are not sleeping well.  You have pain that gets worse or pain that is not helped with medicine.  You have a fever.  You are losing weight and you do not know why.  You have night sweats. Get help right away if:  You cough up blood.  You have trouble breathing.  Your heartbeat is very fast. This information is not intended to replace advice given to you by your health care provider. Make sure you discuss any questions you have with your health care provider. Document Released: 09/13/2010 Document Revised: 06/08/2015 Document Reviewed: 03/09/2014 Elsevier Interactive Patient Education  2018 Elsevier Inc.      Agustina Caroli, MD [facility]

## 2017-01-22 NOTE — Patient Instructions (Addendum)
     IF you received an x-ray today, you will receive an invoice from [facility]. Please contact  Radiology at [phone] with questions or concerns regarding your invoice.   IF you received labwork today, you will receive an invoice from [facility]. Please contact [facility] at [phone] with questions or concerns regarding your invoice.   Our billing staff will not be able to assist you with questions regarding bills from these companies.  You will be contacted with the lab results as soon as they are available. The fastest way to get your results is to activate your My Chart account. Instructions are located on the last page of this paperwork. If you have not heard from us regarding the results in 2 weeks, please contact this office.     Cough, Adult A cough helps to clear your throat and lungs. A cough may last only 2-3 weeks (acute), or it may last longer than 8 weeks (chronic). Many different things can cause a cough. A cough may be a sign of an illness or another medical condition. Follow these instructions at home:  Pay attention to any changes in your cough.  Take medicines only as told by your doctor. ? If you were prescribed an antibiotic medicine, take it as told by your doctor. Do not stop taking it even if you start to feel better. ? Talk with your doctor before you try using a cough medicine.  Drink enough fluid to keep your pee (urine) clear or pale yellow.  If the air is dry, use a cold steam vaporizer or humidifier in your home.  Stay away from things that make you cough at work or at home.  If your cough is worse at night, try using extra pillows to raise your head up higher while you sleep.  Do not smoke, and try not to be around smoke. If you need help quitting, ask your doctor.  Do not have caffeine.  Do not drink alcohol.  Rest as needed. Contact a doctor if:  You have new problems (symptoms).  You cough up yellow fluid  (pus).  Your cough does not get better after 2-3 weeks, or your cough gets worse.  Medicine does not help your cough and you are not sleeping well.  You have pain that gets worse or pain that is not helped with medicine.  You have a fever.  You are losing weight and you do not know why.  You have night sweats. Get help right away if:  You cough up blood.  You have trouble breathing.  Your heartbeat is very fast. This information is not intended to replace advice given to you by your health care provider. Make sure you discuss any questions you have with your health care provider. Document Released: 09/13/2010 Document Revised: 06/08/2015 Document Reviewed: 03/09/2014 Elsevier Interactive Patient Education  2018 Elsevier Inc.  

## 2017-01-24 DIAGNOSIS — M9903 Segmental and somatic dysfunction of lumbar region: Secondary | ICD-10-CM | POA: Diagnosis not present

## 2017-01-24 DIAGNOSIS — M5030 Other cervical disc degeneration, unspecified cervical region: Secondary | ICD-10-CM | POA: Diagnosis not present

## 2017-01-24 DIAGNOSIS — M9901 Segmental and somatic dysfunction of cervical region: Secondary | ICD-10-CM | POA: Diagnosis not present

## 2017-01-24 DIAGNOSIS — M9902 Segmental and somatic dysfunction of thoracic region: Secondary | ICD-10-CM | POA: Diagnosis not present

## 2017-01-30 ENCOUNTER — Encounter: Payer: Self-pay | Admitting: Physician Assistant

## 2017-01-30 ENCOUNTER — Ambulatory Visit (INDEPENDENT_AMBULATORY_CARE_PROVIDER_SITE_OTHER): Payer: Medicare Other | Admitting: Physician Assistant

## 2017-01-30 ENCOUNTER — Other Ambulatory Visit: Payer: Self-pay

## 2017-01-30 VITALS — BP 136/88 | HR 68 | Temp 98.8°F | Resp 18 | Ht 63.58 in | Wt 196.6 lb

## 2017-01-30 DIAGNOSIS — R062 Wheezing: Secondary | ICD-10-CM

## 2017-01-30 DIAGNOSIS — R05 Cough: Secondary | ICD-10-CM | POA: Diagnosis not present

## 2017-01-30 DIAGNOSIS — R059 Cough, unspecified: Secondary | ICD-10-CM

## 2017-01-30 LAB — POCT CBC
Granulocyte percent: 53.7 %G (ref 37–80)
HCT, POC: 43.9 % (ref 37.7–47.9)
HEMOGLOBIN: 13.9 g/dL (ref 12.2–16.2)
LYMPH, POC: 2.2 (ref 0.6–3.4)
MCH: 26.3 pg — AB (ref 27–31.2)
MCHC: 31.6 g/dL — AB (ref 31.8–35.4)
MCV: 83.3 fL (ref 80–97)
MID (cbc): 0.2 (ref 0–0.9)
MPV: 8.2 fL (ref 0–99.8)
PLATELET COUNT, POC: 307 10*3/uL (ref 142–424)
POC Granulocyte: 2.8 (ref 2–6.9)
POC LYMPH PERCENT: 41.7 %L (ref 10–50)
POC MID %: 4.6 %M (ref 0–12)
RBC: 5.27 M/uL (ref 4.04–5.48)
RDW, POC: 14.2 %
WBC: 5.3 10*3/uL (ref 4.6–10.2)

## 2017-01-30 LAB — POCT INFLUENZA A/B
Influenza A, POC: NEGATIVE
Influenza B, POC: NEGATIVE

## 2017-01-30 MED ORDER — DOXYCYCLINE HYCLATE 100 MG PO CAPS: 100.0000 mg | ORAL_CAPSULE | Freq: Two times a day (BID) | ORAL | 0 refills | Status: DC

## 2017-01-30 MED ORDER — IPRATROPIUM BROMIDE 0.02 % IN SOLN
0.5000 mg | Freq: Once | RESPIRATORY_TRACT | Status: AC
  Administered 2017-01-30: 0.5 mg via RESPIRATORY_TRACT

## 2017-01-30 MED ORDER — BENZONATATE 100 MG PO CAPS: 100.0000 mg | ORAL_CAPSULE | Freq: Three times a day (TID) | ORAL | 0 refills | Status: DC | PRN

## 2017-01-30 MED ORDER — ALBUTEROL SULFATE (2.5 MG/3ML) 0.083% IN NEBU
2.5000 mg | INHALATION_SOLUTION | Freq: Once | RESPIRATORY_TRACT | Status: AC
  Administered 2017-01-30: 2.5 mg via RESPIRATORY_TRACT

## 2017-01-30 MED ORDER — ALBUTEROL SULFATE HFA 108 (90 BASE) MCG/ACT IN AERS: 2.0000 | INHALATION_SPRAY | RESPIRATORY_TRACT | 1 refills | Status: DC | PRN

## 2017-01-30 NOTE — Patient Instructions (Addendum)
For cough, we are going to treat with an antibiotic to cover for any underlying bacterial etiology. While taking Doxycycline:  -Do not drink milk or take iron supplements, multivitamins, calcium supplements, antacids, laxatives within 2 hours before or after taking doxycycline. -Avoid direct exposure to sunlight or tanning beds. Doxycycline can make you sunburn more easily. Wear protective clothing and use sunscreen (SPF 30 or higher) when you are outdoors. -Antibiotic medicines can cause diarrhea, which may be a sign of a new infection. If you have diarrhea that is watery or bloody, stop taking this medicine and seek medical care.   I have also given you a prescription for an inhaler to use every 4-6 hours as needed for wheezing. Please follow up on Monday for reevaluation. Return sooner if symptoms worsen or you develop new concerning symptoms. Thank you for letting me participate in your health and well being.   Cough, Adult A cough helps to clear your throat and lungs. A cough may last only 2-3 weeks (acute), or it may last longer than 8 weeks (chronic). Many different things can cause a cough. A cough may be a sign of an illness or another medical condition. Follow these instructions at home:  Pay attention to any changes in your cough.  Take medicines only as told by your doctor. ? If you were prescribed an antibiotic medicine, take it as told by your doctor. Do not stop taking it even if you start to feel better. ? Talk with your doctor before you try using a cough medicine.  Drink enough fluid to keep your pee (urine) clear or pale yellow.  If the air is dry, use a cold steam vaporizer or humidifier in your home.  Stay away from things that make you cough at work or at home.  If your cough is worse at night, try using extra pillows to raise your head up higher while you sleep.  Do not smoke, and try not to be around smoke. If you need help quitting, ask your doctor.  Do not have  caffeine.  Do not drink alcohol.  Rest as needed. Contact a doctor if:  You have new problems (symptoms).  You cough up yellow fluid (pus).  Your cough does not get better after 2-3 weeks, or your cough gets worse.  Medicine does not help your cough and you are not sleeping well.  You have pain that gets worse or pain that is not helped with medicine.  You have a fever.  You are losing weight and you do not know why.  You have night sweats. Get help right away if:  You cough up blood.  You have trouble breathing.  Your heartbeat is very fast. This information is not intended to replace advice given to you by your health care provider. Make sure you discuss any questions you have with your health care provider. Document Released: 09/13/2010 Document Revised: 06/08/2015 Document Reviewed: 03/09/2014 Elsevier Interactive Patient Education  2018 [employer].    IF you received an x-ray today, you will receive an invoice from [facility]. Please contact [facility] at [phone] with questions or concerns regarding your invoice.   IF you received labwork today, you will receive an invoice from [place]. Please contact [facility] at [phone] with questions or concerns regarding your invoice.   Our billing staff will not be able to assist you with questions regarding bills from these companies.  You will be contacted with the lab results as soon as they are  available. The fastest way to get your results is to activate your My Chart account. Instructions are located on the last page of this paperwork. If you have not heard from [place] regarding the results in 2 weeks, please contact this office.

## 2017-01-30 NOTE — Progress Notes (Signed)
MRN: [id number] DOB: 03/30/1935  Subjective:   Kathleen Pacheco is a 82 y.o. female presenting for chief complaint of Cough (X 4 days- with chest congestion) .  Reports 5 day history of worsening cough. Notes it became productive yesterday with green phlegm production. Has associated body aches. Denies fever, chills, hemoptysis, wheezing, SOB, chest pain, ear pain, sore throat, sinus pressure, nausea, vomiting, abdominal pain and diarrhea. Of note, pt was seen in office on 01/22/17 by Dr. Mitchel Honour for right side back pain when coughing. She had just finished course of prednisone for muscle spasm at that time. Plain films showed right sided infiltrate vs pulmonary edema. Lung exam was normal. Given Rx for tessalon perles. Today, she reports that the back pain has resolved and she felt like that initial cough had resolved but now it is worse. Has tried tessalon perles and delsym cough syrup with no relief. Has had sick contact with friends. Has history of seasonal allergies, no history of asthma. Patient has had flu shot this season. Denies smoking and alcohol use.  No PMH of heart failure, follows cardiology every 3 months. Per pt, last echo was in 2017 and was normal. Denies any other aggravating or relieving factors, no other questions or concerns.  Kathleen Pacheco has a current medication list which includes the following prescription(s): beano, amlodipine, aspirin ec, atorvastatin, cetirizine, hydrochlorothiazide, hydroxyzine, multivitamin gummies adult, potassium chloride sa, promethazine-codeine, tramadol, trazodone, turmeric, albuterol, azithromycin, b-complex with vitamin c, benzonatate, phenazopyridine, and triamcinolone cream. Also is allergic to ace inhibitors; other; and penicillins.  Kathleen Pacheco  has a past medical history of Bradycardia, Gout, bladder infections, Hyperlipidemia, Hypertension, Migraine, Thyroid disease, and Vertigo. Also  has a past surgical history that includes Abdominal hysterectomy;  Parathyroidectomy; Gallbladder surgery; and Cholecystectomy.   Objective:   Vitals: BP 136/88 (BP Location: Left Arm, Patient Position: Sitting, Cuff Size: Normal)   Pulse 68   Temp 98.8 F (37.1 C) (Oral)   Resp 18   Ht 5' 3.58" (1.615 m)   Wt 196 lb 9.6 oz (89.2 kg)   SpO2 98%   BMI 34.19 kg/m   Physical Exam  Constitutional: She is oriented to person, place, and time. She appears well-developed and well-nourished. No distress.  HENT:  Head: Normocephalic and atraumatic.  Right Ear: Tympanic membrane, external ear and ear canal normal.  Left Ear: Tympanic membrane, external ear and ear canal normal.  Nose: Rhinorrhea present. No mucosal edema. Right sinus exhibits no maxillary sinus tenderness and no frontal sinus tenderness. Left sinus exhibits no maxillary sinus tenderness and no frontal sinus tenderness.  Mouth/Throat: Uvula is midline, oropharynx is clear and moist and mucous membranes are normal. No tonsillar exudate.  Eyes: Conjunctivae are normal.  Neck: Normal range of motion. No JVD present.  Cardiovascular: Normal rate, regular rhythm, normal heart sounds and intact distal pulses.  Pulmonary/Chest: Effort normal. No respiratory distress. She has wheezes (intermittent, noted throughout posterior right lung field). She has rhonchi (in RLL, cleared with cough). She has rales (RLL).  Musculoskeletal:       Right lower leg: She exhibits no edema.       Left lower leg: She exhibits no swelling.  Lymphadenopathy:       Head (right side): No submental, no submandibular, no tonsillar, no preauricular, no posterior auricular and no occipital adenopathy present.       Head (left side): No submental, no submandibular, no tonsillar, no preauricular, no posterior auricular and no occipital adenopathy present.  She has no cervical adenopathy.       Right: No supraclavicular adenopathy present.       Left: No supraclavicular adenopathy present.  Neurological: She is alert and  oriented to person, place, and time.  Skin: Skin is warm and dry.  Psychiatric: She has a normal mood and affect.  Vitals reviewed.   Results for orders placed or performed in visit on 01/30/17 (from the past 24 hour(s))  POCT Influenza A/B     Status: Normal   Collection Time: 01/30/17 12:47 PM  Result Value Ref Range   Influenza A, POC Negative Negative   Influenza B, POC Negative Negative  POCT CBC     Status: Abnormal   Collection Time: 01/30/17 12:49 PM  Result Value Ref Range   WBC 5.3 4.6 - 10.2 K/uL   Lymph, poc 2.2 0.6 - 3.4   POC LYMPH PERCENT 41.7 10 - 50 %L   MID (cbc) 0.2 0 - 0.9   POC MID % 4.6 0 - 12 %M   POC Granulocyte 2.8 2 - 6.9   Granulocyte percent 53.7 37 - 80 %G   RBC 5.27 4.04 - 5.48 M/uL   Hemoglobin 13.9 12.2 - 16.2 g/dL   HCT, POC 43.9 37.7 - 47.9 %   MCV 83.3 80 - 97 fL   MCH, POC 26.3 (A) 27 - 31.2 pg   MCHC 31.6 (A) 31.8 - 35.4 g/dL   RDW, POC 14.2 %   Platelet Count, POC 307 142 - 424 K/uL   MPV 8.2 0 - 99.8 fL   Breathing treatment of atrovent and albuterol given in office. Patient reports improvement. Wheezing resolved. Mild rhonchi noted in right posterior lower lung field.   Assessment and Plan :  1. Wheezing Resolved with breathing treatment.  Given Rx for albuterol inhaler to have at home if wheezing returns. - albuterol (PROVENTIL) (2.5 MG/3ML) 0.083% nebulizer solution 2.5 mg - ipratropium (ATROVENT) nebulizer solution 0.5 mg - POCT Influenza A/B - POCT CBC  2. Cough Plain film of the chest on 01/22/17 showed right sided infiltrate vs pulmonary edema.  Due to worsening of cough, which is now productive, physical exam findings, and recent findings on chest x-ray, will treat empirically at this time for underlying pneumonia.  Vitals are stable.  She is afebrile.  She is overall well-appearing.  WBC WNL.  No signs of fluid overload on exam.  Follow-up in office in 4 days for reevaluation.  Given strict return precautions. - doxycycline  (VIBRAMYCIN) 100 MG capsule; Take 1 capsule (100 mg total) by mouth 2 (two) times daily.  Dispense: 20 capsule; Refill: 0 - albuterol (PROVENTIL HFA;VENTOLIN HFA) 108 (90 Base) MCG/ACT inhaler; Inhale 2 puffs into the lungs every 4 (four) hours as needed for wheezing or shortness of breath (cough, shortness of breath or wheezing.).  Dispense: 1 Inhaler; Refill: 1 - benzonatate (TESSALON) 100 MG capsule; Take 1-2 capsules (100-200 mg total) by mouth 3 (three) times daily as needed for cough.  Dispense: 40 capsule; Refill: 0  Tenna Delaine, PA-C  Primary Care at [place] 01/30/2017 12:52 PM

## 2017-01-31 ENCOUNTER — Encounter: Payer: Self-pay | Admitting: Physician Assistant

## 2017-02-03 ENCOUNTER — Ambulatory Visit: Payer: Medicare Other | Admitting: Physician Assistant

## 2017-02-03 ENCOUNTER — Ambulatory Visit: Payer: Medicare Other | Admitting: Urgent Care

## 2017-02-06 ENCOUNTER — Ambulatory Visit: Payer: Medicare Other | Admitting: Physician Assistant

## 2017-02-06 ENCOUNTER — Other Ambulatory Visit: Payer: Self-pay

## 2017-02-06 ENCOUNTER — Ambulatory Visit (INDEPENDENT_AMBULATORY_CARE_PROVIDER_SITE_OTHER): Payer: Medicare Other | Admitting: Physician Assistant

## 2017-02-06 ENCOUNTER — Encounter: Payer: Self-pay | Admitting: Physician Assistant

## 2017-02-06 VITALS — BP 127/82 | HR 70 | Temp 98.3°F | Resp 18 | Ht 63.39 in | Wt 196.0 lb

## 2017-02-06 DIAGNOSIS — J189 Pneumonia, unspecified organism: Secondary | ICD-10-CM

## 2017-02-06 MED ORDER — GUAIFENESIN ER 1200 MG PO TB12: 1.0000 | ORAL_TABLET | Freq: Two times a day (BID) | ORAL | 0 refills | Status: DC | PRN

## 2017-02-06 NOTE — Patient Instructions (Addendum)
You can pick up mucinex for continued cough to see if it helps bring stuff up. Use tessalon perles to suppress the cough. Continue antibiotics until they are complete. Return in 4 weeks for repeat chest xray. Return sooner if any of your symptoms worsen or you develop new concerning symptoms. Thank you for letting me participate in your health and well being.  Cough, Adult A cough helps to clear your throat and lungs. A cough may last only 2-3 weeks (acute), or it may last longer than 8 weeks (chronic). Many different things can cause a cough. A cough may be a sign of an illness or another medical condition. Follow these instructions at home:  Pay attention to any changes in your cough.  Take medicines only as told by your doctor. ? If you were prescribed an antibiotic medicine, take it as told by your doctor. Do not stop taking it even if you start to feel better. ? Talk with your doctor before you try using a cough medicine.  Drink enough fluid to keep your pee (urine) clear or pale yellow.  If the air is dry, use a cold steam vaporizer or humidifier in your home.  Stay away from things that make you cough at work or at home.  If your cough is worse at night, try using extra pillows to raise your head up higher while you sleep.  Do not smoke, and try not to be around smoke. If you need help quitting, ask your doctor.  Do not have caffeine.  Do not drink alcohol.  Rest as needed. Contact a doctor if:  You have new problems (symptoms).  You cough up yellow fluid (pus).  Your cough does not get better after 2-3 weeks, or your cough gets worse.  Medicine does not help your cough and you are not sleeping well.  You have pain that gets worse or pain that is not helped with medicine.  You have a fever.  You are losing weight and you do not know why.  You have night sweats. Get help right away if:  You cough up blood.  You have trouble breathing.  Your heartbeat is very  fast. This information is not intended to replace advice given to you by your health care provider. Make sure you discuss any questions you have with your health care provider. Document Released: 09/13/2010 Document Revised: 06/08/2015 Document Reviewed: 03/09/2014 Elsevier Interactive Patient Education  2018 [employer].    IF you received an x-ray today, you will receive an invoice from [facility]. Please contact [facility] at [phone] with questions or concerns regarding your invoice.   IF you received labwork today, you will receive an invoice from [place]. Please contact [facility] at [phone] with questions or concerns regarding your invoice.   Our billing staff will not be able to assist you with questions regarding bills from these companies.  You will be contacted with the lab results as soon as they are available. The fastest way to get your results is to activate your My Chart account. Instructions are located on the last page of this paperwork. If you have not heard from [place] regarding the results in 2 weeks, please contact this office.

## 2017-02-06 NOTE — Progress Notes (Signed)
Kathleen Pacheco  MRN: [id number] DOB: Oct 01, 1935  Subjective:  Kathleen Pacheco is a 82 y.o. female seen in office today for a chief complaint of follow up on pneumonia. Initially seen on 01/22/17 for right sided chest wall pain and cough. Plain film at that OV showed infiltrate vs pulmonary edema. Tx symptomatically. She returned on 01/31/16 for worsening cough, which became productive. PE last visit revealed intermittent wheezing (resolved with breathing tx) and rhonchi and rales in RLL. No signs of fluid overload. WBC wnl. Tx with doxycycline for underlying pneumonia and instructed to return in 4 days for reevaluation. Today, patient reports she is about 40% better.  She is still having a cough, but not as frequent as it was.  She is also having some fatigue.  She is no longer having the right sided chest pain when she coughs.  She denies fever, chills, shortness of breath, wheezing, chest pain, nausea, and vomiting.  She has taken antibiotics as prescribed.  She has been using [employer] [facility].  She denies smoking.  No other questions or complaints.  Review of Systems Per HPI Patient Active Problem List   Diagnosis Date Noted  . Acute bronchitis 03/18/2016  . Osteoarthrosis, hand 10/22/2013  . DIZZINESS 02/02/2009  . HYPERCHOLESTEROLEMIA 01/25/2009  . HYPERTENSION 01/25/2009    Current Outpatient Medications on File Prior to Visit  Medication Sig Dispense Refill  . albuterol (PROVENTIL HFA;VENTOLIN HFA) 108 (90 Base) MCG/ACT inhaler Inhale 2 puffs into the lungs every 4 (four) hours as needed for wheezing or shortness of breath (cough, shortness of breath or wheezing.). 1 Inhaler 1  . Alpha-D-Galactosidase (BEANO) TABS Take 2 tablets by mouth every 6 (six) hours as needed (for gas).    Marland Kitchen amLODipine (NORVASC) 5 MG tablet Take 5 mg by mouth daily.    Marland Kitchen aspirin EC 81 MG tablet Take 81 mg by mouth daily.    Marland Kitchen atorvastatin (LIPITOR) 40 MG tablet Take 40 mg by mouth daily.    Marland Kitchen azithromycin  (ZITHROMAX) 250 MG tablet Sig as indicated 6 tablet 0  . B Complex-C (B-COMPLEX WITH VITAMIN C) tablet Take 1 tablet by mouth daily.    . benzonatate (TESSALON) 100 MG capsule Take 1-2 capsules (100-200 mg total) by mouth 3 (three) times daily as needed for cough. 40 capsule 0  . cetirizine (ZYRTEC) 10 MG tablet Take 10 mg by mouth daily as needed for allergies.     Marland Kitchen doxycycline (VIBRAMYCIN) 100 MG capsule Take 1 capsule (100 mg total) by mouth 2 (two) times daily. 20 capsule 0  . hydrochlorothiazide (HYDRODIURIL) 25 MG tablet Take 25 mg by mouth daily.    . hydrOXYzine (ATARAX/VISTARIL) 50 MG tablet TAKE 2 TABLETS (100 MG TOTAL) BY MOUTH AT BEDTIME AS NEEDED (INSOMNIA). 30 tablet 0  . Multiple Vitamins-Minerals (MULTIVITAMIN GUMMIES ADULT) CHEW Chew 3 each by mouth daily.    . phenazopyridine (PYRIDIUM) 100 MG tablet Take 1 tablet (100 mg total) by mouth 3 (three) times daily as needed for pain. 30 tablet 0  . potassium chloride SA (K-DUR,KLOR-CON) 20 MEQ tablet Take 20 mEq by mouth daily.    . promethazine-codeine (PHENERGAN WITH CODEINE) 6.25-10 MG/5ML syrup Take 5 mLs by mouth at bedtime as needed for cough. 120 mL 0  . traMADol (ULTRAM) 50 MG tablet Take 50 mg by mouth every 6 (six) hours as needed for moderate pain.    . traZODone (DESYREL) 50 MG tablet Take 1 tablet (50 mg total) by mouth at  bedtime. 90 tablet 1  . triamcinolone cream (KENALOG) 0.1 % Apply 1 application topically 2 (two) times daily. 30 g 0  . TURMERIC PO Take 5 mLs by mouth daily.     No current facility-administered medications on file prior to visit.     Allergies  Allergen Reactions  . Ace Inhibitors Other (See Comments)    Reaction:  Unknown   . Other Swelling and Other (See Comments)    Pt states that she is allergic to artificial sweeteners.   Reaction:  Facial swelling   . Penicillins Other (See Comments)    Reaction:  Yeast infection  Has patient had a PCN reaction causing immediate rash,  facial/tongue/throat swelling, SOB or lightheadedness with hypotension: No Has patient had a PCN reaction causing severe rash involving mucus membranes or skin necrosis: No Has patient had a PCN reaction that required hospitalization No Has patient had a PCN reaction occurring within the last 10 years: No If all of the above answers are "NO", then may proceed with Cephalosporin use.     Objective:  BP 127/82 (BP Location: Right Arm, Patient Position: Sitting, Cuff Size: Normal)   Pulse 70   Temp 98.3 F (36.8 C) (Oral)   Resp 18   Ht 5' 3.39" (1.61 m)   Wt 196 lb (88.9 kg)   SpO2 98%   BMI 34.30 kg/m   Physical Exam  Constitutional: She is oriented to person, place, and time and well-developed, well-nourished, and in no distress.  HENT:  Head: Normocephalic and atraumatic.  Right Ear: Tympanic membrane, external ear and ear canal normal.  Left Ear: Tympanic membrane, external ear and ear canal normal.  Nose: Right sinus exhibits no maxillary sinus tenderness and no frontal sinus tenderness. Left sinus exhibits no maxillary sinus tenderness and no frontal sinus tenderness.  Mouth/Throat: Uvula is midline, oropharynx is clear and moist and mucous membranes are normal.  Eyes: Conjunctivae are normal.  Neck: Normal range of motion.  Cardiovascular: Normal rate, regular rhythm and normal heart sounds.  Pulmonary/Chest: Effort normal and breath sounds normal. She has no wheezes. She has no rhonchi. She has no rales.  Lymphadenopathy:       Head (right side): No submental, no submandibular, no tonsillar, no preauricular, no posterior auricular and no occipital adenopathy present.       Head (left side): No submental, no submandibular, no tonsillar, no preauricular, no posterior auricular and no occipital adenopathy present.    She has no cervical adenopathy.       Right: No supraclavicular adenopathy present.       Left: No supraclavicular adenopathy present.  Neurological: She is alert  and oriented to person, place, and time. Gait normal.  Skin: Skin is warm and dry.  Psychiatric: Affect normal.  Vitals reviewed.   Assessment and Plan :  1. Pneumonia of right lung due to infectious organism, unspecified part of lung Healing appropriately.  Vital stable, she is well-appearing.  Lungs are CTAB.  Recommended to continue eating light meals, oral hydration, and completing antibiotics until they are done.  Use Tessalon Perles to help to suppress cough and Mucinex spectrum.  Follow-up in 4 weeks for repeat chest x-ray.  Return sooner if symptoms worsen or she develops new concerning symptoms. - Guaifenesin (MUCINEX MAXIMUM STRENGTH) 1200 MG TB12; Take 1 tablet (1,200 mg total) by mouth every 12 (twelve) hours as needed.  Dispense: 20 tablet; Refill: 0  Tenna Delaine PA-C  Primary Care at [facility] 02/06/2017 9:13 AM

## 2017-02-10 ENCOUNTER — Ambulatory Visit (INDEPENDENT_AMBULATORY_CARE_PROVIDER_SITE_OTHER): Payer: Medicare Other | Admitting: Physician Assistant

## 2017-02-10 ENCOUNTER — Encounter: Payer: Self-pay | Admitting: Physician Assistant

## 2017-02-10 ENCOUNTER — Other Ambulatory Visit: Payer: Self-pay

## 2017-02-10 VITALS — BP 127/72 | HR 72 | Temp 97.8°F | Resp 18 | Ht 63.54 in | Wt 194.8 lb

## 2017-02-10 DIAGNOSIS — R062 Wheezing: Secondary | ICD-10-CM | POA: Diagnosis not present

## 2017-02-10 DIAGNOSIS — R05 Cough: Secondary | ICD-10-CM | POA: Diagnosis not present

## 2017-02-10 DIAGNOSIS — R059 Cough, unspecified: Secondary | ICD-10-CM

## 2017-02-10 DIAGNOSIS — J189 Pneumonia, unspecified organism: Secondary | ICD-10-CM | POA: Diagnosis not present

## 2017-02-10 MED ORDER — PREDNISONE 20 MG PO TABS: 40.0000 mg | ORAL_TABLET | Freq: Every day | ORAL | 0 refills | Status: DC

## 2017-02-10 MED ORDER — ALBUTEROL SULFATE (2.5 MG/3ML) 0.083% IN NEBU
2.5000 mg | INHALATION_SOLUTION | Freq: Once | RESPIRATORY_TRACT | Status: AC
  Administered 2017-02-10: 2.5 mg via RESPIRATORY_TRACT

## 2017-02-10 MED ORDER — IPRATROPIUM BROMIDE 0.02 % IN SOLN
0.5000 mg | Freq: Once | RESPIRATORY_TRACT | Status: AC
  Administered 2017-02-10: 0.5 mg via RESPIRATORY_TRACT

## 2017-02-10 NOTE — Patient Instructions (Addendum)
We are going to treat your underlying inflammation with oral prednisone. Prednisone is a steroid and can cause side effects such as headache, irritability, nausea, vomiting, increased heart rate, increased blood pressure, increased blood sugar, appetite changes, and insomnia. Please take tablets in the morning with a full meal to help decrease the chances of these side effects.   Pick up the albuterol inhaler and use every 4-6 hours as needed for cough and shortness of breath.  Return on 02/22/17 for repeat chest xray.   Bronchospasm, Adult Bronchospasm is when airways in the lungs get smaller. When this happens, it can be hard to breathe. You may cough. You may also make a whistling sound when you breathe (wheeze). Follow these instructions at home: Medicines  Take over-the-counter and prescription medicines only as told by your doctor.  If you need to use an inhaler or nebulizer to take your medicine, ask your doctor how to use it.  If you were given a spacer, always use it with your inhaler. Lifestyle  Change your heating and air conditioning filter. Do this at least once a month.  Try not to use fireplaces and wood stoves.  Do not  smoke. Do not  allow smoking in your home.  Try not to use things that have a strong smell, like perfume.  Get rid of pests (such as roaches and mice) and their poop.  Remove any mold from your home.  Keep your house clean. Get rid of dust.  Use cleaning products that have no smell.  Replace carpet with wood, tile, or vinyl flooring.  Use allergy-proof pillows, mattress covers, and box spring covers.  Wash bed sheets and blankets every week. Use hot water. Dry them in a dryer.  Use blankets that are made of polyester or cotton.  Wash your hands often.  Keep pets out of your bedroom.  When you exercise, try not to breathe in cold air. General instructions  Have a plan for getting medical care. Know these things: ? When to call your  doctor. ? When to call local emergency services (911 in the [place] ? Where to go in an emergency.  Stay up to date on your shots (immunizations).  When you have an episode: ? Stay calm. ? Relax. ? Breathe slowly. Contact a doctor if:  Your muscles ache.  Your chest hurts.  The color of the mucus you cough up (sputum) changes from clear or white to yellow, green, gray, or bloody.  The mucus you cough up gets thicker.  You have a fever. Get help right away if:  The whistling sound gets worse, even after you take your medicines.  Your coughing gets worse.  You find it even harder to breathe.  Your chest hurts very much. Summary  Bronchospasm is when airways in the lungs get smaller.  When this happens, it can be hard to breathe. You may cough. You may also make a whistling sound when you breathe.  Stay away from things that cause you to have episodes. These include smoke or dust. This information is not intended to replace advice given to you by your health care provider. Make sure you discuss any questions you have with your health care provider. Document Released: 10/28/2008 Document Revised: 01/04/2016 Document Reviewed: 01/04/2016 Elsevier Interactive Patient Education  2017 [employer].   IF you received an x-ray today, you will receive an invoice from [facility]. Please contact [facility] at [phone] with questions or concerns regarding your invoice.  IF you received labwork today, you will receive an invoice from [place]. Please contact [facility] at [phone] with questions or concerns regarding your invoice.   Our billing staff will not be able to assist you with questions regarding bills from these companies.  You will be contacted with the lab results as soon as they are available. The fastest way to get your results is to activate your My Chart account. Instructions are located on the last page of this paperwork. If you have not  heard from [place] regarding the results in 2 weeks, please contact this office.

## 2017-02-10 NOTE — Progress Notes (Signed)
Kathleen Pacheco  MRN: [id number] DOB: 29-Mar-1935  Subjective:  Kathleen Pacheco is a 82 y.o. female seen in office today for a chief complaint of follow-up on pneumonia.  Initially seen on 01/22/17 for right-sided chest wall pain and cough.  Plain films showed infiltrate versus pulmonary edema.  She was treated symptomatically.  Returned on 01/30/17 for worsening cough, which had become productive.  Treated with doxycycline.  Please review last OV note for additional details.  Today, patient reports she has completed doxycycline course.  She feels about 60% better.  Notes she is still having an occasional cough and intermittent fatigue.  She has occasional wheezes.  Denies fever, chills, phlegm production, shortness of breath, chest pain, lower leg swelling, nausea, and vomiting.  Has been using Tessalon Perles and Mucinex which helped.  Denies smoking.  No other questions or concerns.  Review of Systems  Per HPI.  Patient Active Problem List   Diagnosis Date Noted  . Acute bronchitis 03/18/2016  . Osteoarthrosis, hand 10/22/2013  . DIZZINESS 02/02/2009  . HYPERCHOLESTEROLEMIA 01/25/2009  . HYPERTENSION 01/25/2009    Current Outpatient Medications on File Prior to Visit  Medication Sig Dispense Refill  . albuterol (PROVENTIL HFA;VENTOLIN HFA) 108 (90 Base) MCG/ACT inhaler Inhale 2 puffs into the lungs every 4 (four) hours as needed for wheezing or shortness of breath (cough, shortness of breath or wheezing.). 1 Inhaler 1  . Alpha-D-Galactosidase (BEANO) TABS Take 2 tablets by mouth every 6 (six) hours as needed (for gas).    Marland Kitchen amLODipine (NORVASC) 5 MG tablet Take 5 mg by mouth daily.    Marland Kitchen aspirin EC 81 MG tablet Take 81 mg by mouth daily.    Marland Kitchen atorvastatin (LIPITOR) 40 MG tablet Take 40 mg by mouth daily.    Marland Kitchen azithromycin (ZITHROMAX) 250 MG tablet Sig as indicated 6 tablet 0  . B Complex-C (B-COMPLEX WITH VITAMIN C) tablet Take 1 tablet by mouth daily.    . benzonatate (TESSALON) 100 MG  capsule Take 1-2 capsules (100-200 mg total) by mouth 3 (three) times daily as needed for cough. 40 capsule 0  . cetirizine (ZYRTEC) 10 MG tablet Take 10 mg by mouth daily as needed for allergies.     Marland Kitchen doxycycline (VIBRAMYCIN) 100 MG capsule Take 1 capsule (100 mg total) by mouth 2 (two) times daily. 20 capsule 0  . Guaifenesin (MUCINEX MAXIMUM STRENGTH) 1200 MG TB12 Take 1 tablet (1,200 mg total) by mouth every 12 (twelve) hours as needed. 20 tablet 0  . hydrochlorothiazide (HYDRODIURIL) 25 MG tablet Take 25 mg by mouth daily.    . hydrOXYzine (ATARAX/VISTARIL) 50 MG tablet TAKE 2 TABLETS (100 MG TOTAL) BY MOUTH AT BEDTIME AS NEEDED (INSOMNIA). 30 tablet 0  . Multiple Vitamins-Minerals (MULTIVITAMIN GUMMIES ADULT) CHEW Chew 3 each by mouth daily.    . phenazopyridine (PYRIDIUM) 100 MG tablet Take 1 tablet (100 mg total) by mouth 3 (three) times daily as needed for pain. 30 tablet 0  . potassium chloride SA (K-DUR,KLOR-CON) 20 MEQ tablet Take 20 mEq by mouth daily.    . promethazine-codeine (PHENERGAN WITH CODEINE) 6.25-10 MG/5ML syrup Take 5 mLs by mouth at bedtime as needed for cough. 120 mL 0  . traMADol (ULTRAM) 50 MG tablet Take 50 mg by mouth every 6 (six) hours as needed for moderate pain.    . traZODone (DESYREL) 50 MG tablet Take 1 tablet (50 mg total) by mouth at bedtime. 90 tablet 1  . triamcinolone cream (KENALOG)  0.1 % Apply 1 application topically 2 (two) times daily. 30 g 0  . TURMERIC PO Take 5 mLs by mouth daily.     No current facility-administered medications on file prior to visit.     Allergies  Allergen Reactions  . Ace Inhibitors Other (See Comments)    Reaction:  Unknown   . Other Swelling and Other (See Comments)    Pt states that she is allergic to artificial sweeteners.   Reaction:  Facial swelling   . Penicillins Other (See Comments)    Reaction:  Yeast infection  Has patient had a PCN reaction causing immediate rash, facial/tongue/throat swelling, SOB or  lightheadedness with hypotension: No Has patient had a PCN reaction causing severe rash involving mucus membranes or skin necrosis: No Has patient had a PCN reaction that required hospitalization No Has patient had a PCN reaction occurring within the last 10 years: No If all of the above answers are "NO", then may proceed with Cephalosporin use.     Objective:  BP 127/72 (BP Location: Left Arm, Patient Position: Sitting, Cuff Size: Normal)   Pulse 72   Temp 97.8 F (36.6 C) (Oral)   Resp 18   Ht 5' 3.54" (1.614 m)   Wt 194 lb 12.8 oz (88.4 kg)   SpO2 98%   BMI 33.92 kg/m   Physical Exam  Constitutional: She is oriented to person, place, and time and well-developed, well-nourished, and in no distress.  HENT:  Head: Normocephalic and atraumatic.  Eyes: Conjunctivae are normal.  Neck: Normal range of motion.  Cardiovascular: Normal rate, regular rhythm and normal heart sounds.  Pulmonary/Chest: Effort normal. She has no decreased breath sounds. She has wheezes (intermittent wheezes in posterior lung fields bilaterally). She has no rhonchi. She has no rales.  Neurological: She is alert and oriented to person, place, and time. Gait normal.  Skin: Skin is warm and dry.  Psychiatric: Affect normal.  Vitals reviewed.  Breathing treatment of duoneb given in office. Wheezing improved, a few faint wheezes still auscultated on lung exam.   Assessment and Plan :  1. Wheezing Improved but few wheezes still present. Recommend short course prednisone at this time. Albuterol inhaler every 4-6 hrs prn for wheezes.  Patient is overall well-appearing, no distress.  Vitals stable.  Recommend follow up on 02/22/17 for repeat chest x-ray.  Return sooner if symptoms worsen or she develops new concerning symptoms. - albuterol (PROVENTIL) (2.5 MG/3ML) 0.083% nebulizer solution 2.5 mg - ipratropium (ATROVENT) nebulizer solution 0.5 mg - predniSONE (DELTASONE) 20 MG tablet; Take 2 tablets (40 mg total) by  mouth daily with breakfast.  Dispense: 10 tablet; Refill: 0 2. Cough 3. Pneumonia of right lung due to infectious organism, unspecified part of lung Educated patient that cough and fatigue from pneumonia can last up to 4 weeks after treatment was initiated but it is reassuring that she has responded to treatment and is feeling better.  Patient reassured.  Tenna Delaine PA-C  [facility] 02/10/2017 3:49 PM

## 2017-02-11 ENCOUNTER — Encounter: Payer: Self-pay | Admitting: Physician Assistant

## 2017-02-18 DIAGNOSIS — M9902 Segmental and somatic dysfunction of thoracic region: Secondary | ICD-10-CM | POA: Diagnosis not present

## 2017-02-18 DIAGNOSIS — M5030 Other cervical disc degeneration, unspecified cervical region: Secondary | ICD-10-CM | POA: Diagnosis not present

## 2017-02-18 DIAGNOSIS — M9903 Segmental and somatic dysfunction of lumbar region: Secondary | ICD-10-CM | POA: Diagnosis not present

## 2017-02-18 DIAGNOSIS — M9901 Segmental and somatic dysfunction of cervical region: Secondary | ICD-10-CM | POA: Diagnosis not present

## 2017-02-22 ENCOUNTER — Ambulatory Visit (INDEPENDENT_AMBULATORY_CARE_PROVIDER_SITE_OTHER): Payer: Medicare Other | Admitting: Physician Assistant

## 2017-02-22 ENCOUNTER — Ambulatory Visit (INDEPENDENT_AMBULATORY_CARE_PROVIDER_SITE_OTHER): Payer: Medicare Other

## 2017-02-22 ENCOUNTER — Encounter: Payer: Self-pay | Admitting: Physician Assistant

## 2017-02-22 VITALS — BP 123/81 | HR 73 | Temp 98.5°F | Resp 16 | Ht 63.54 in | Wt 196.0 lb

## 2017-02-22 DIAGNOSIS — I517 Cardiomegaly: Secondary | ICD-10-CM | POA: Diagnosis not present

## 2017-02-22 DIAGNOSIS — J189 Pneumonia, unspecified organism: Secondary | ICD-10-CM

## 2017-02-22 NOTE — Progress Notes (Signed)
Kathleen Pacheco  MRN: [id number] DOB: 15-Nov-1935  Subjective:  Kathleen Pacheco is a 82 y.o. female seen in office today for a chief complaint of follow-up on pneumonia.  Patient initially seen on 01/22/17 and has followed up closely since.  Please review previous OV notes for additional details.  She recently completed a short course of prednisone for wheezing.  Today, she notes she is completely back to her self.  She is feeling about 90% better.  She will have the occasional nonproductive cough.  She denies fever, fatigue, shortness of breath, dyspnea on exertion, chest pain, wheezing, lower leg swelling, nausea, and vomiting.  Has no other questions or concerns today.  Review of Systems  Constitutional: Negative for chills and diaphoresis.  Cardiovascular: Negative for palpitations.  Neurological: Negative for dizziness and light-headedness.    Patient Active Problem List   Diagnosis Date Noted  . Acute bronchitis 03/18/2016  . Osteoarthrosis, hand 10/22/2013  . DIZZINESS 02/02/2009  . HYPERCHOLESTEROLEMIA 01/25/2009  . HYPERTENSION 01/25/2009    Current Outpatient Medications on File Prior to Visit  Medication Sig Dispense Refill  . albuterol (PROVENTIL HFA;VENTOLIN HFA) 108 (90 Base) MCG/ACT inhaler Inhale 2 puffs into the lungs every 4 (four) hours as needed for wheezing or shortness of breath (cough, shortness of breath or wheezing.). 1 Inhaler 1  . Alpha-D-Galactosidase (BEANO) TABS Take 2 tablets by mouth every 6 (six) hours as needed (for gas).    Marland Kitchen amLODipine (NORVASC) 5 MG tablet Take 5 mg by mouth daily.    Marland Kitchen aspirin EC 81 MG tablet Take 81 mg by mouth daily.    Marland Kitchen atorvastatin (LIPITOR) 40 MG tablet Take 40 mg by mouth daily.    . B Complex-C (B-COMPLEX WITH VITAMIN C) tablet Take 1 tablet by mouth daily.    . benzonatate (TESSALON) 100 MG capsule Take 1-2 capsules (100-200 mg total) by mouth 3 (three) times daily as needed for cough. 40 capsule 0  . doxycycline  (VIBRAMYCIN) 100 MG capsule Take 1 capsule (100 mg total) by mouth 2 (two) times daily. 20 capsule 0  . Guaifenesin (MUCINEX MAXIMUM STRENGTH) 1200 MG TB12 Take 1 tablet (1,200 mg total) by mouth every 12 (twelve) hours as needed. 20 tablet 0  . hydrochlorothiazide (HYDRODIURIL) 25 MG tablet Take 25 mg by mouth daily.    . hydrOXYzine (ATARAX/VISTARIL) 50 MG tablet TAKE 2 TABLETS (100 MG TOTAL) BY MOUTH AT BEDTIME AS NEEDED (INSOMNIA). 30 tablet 0  . Multiple Vitamins-Minerals (MULTIVITAMIN GUMMIES ADULT) CHEW Chew 3 each by mouth daily.    . potassium chloride SA (K-DUR,KLOR-CON) 20 MEQ tablet Take 20 mEq by mouth daily.    . predniSONE (DELTASONE) 20 MG tablet Take 2 tablets (40 mg total) by mouth daily with breakfast. 10 tablet 0  . promethazine-codeine (PHENERGAN WITH CODEINE) 6.25-10 MG/5ML syrup Take 5 mLs by mouth at bedtime as needed for cough. 120 mL 0  . traMADol (ULTRAM) 50 MG tablet Take 50 mg by mouth every 6 (six) hours as needed for moderate pain.    . traZODone (DESYREL) 50 MG tablet Take 1 tablet (50 mg total) by mouth at bedtime. 90 tablet 1  . triamcinolone cream (KENALOG) 0.1 % Apply 1 application topically 2 (two) times daily. 30 g 0  . TURMERIC PO Take 5 mLs by mouth daily.    . cetirizine (ZYRTEC) 10 MG tablet Take 10 mg by mouth daily as needed for allergies.     . phenazopyridine (PYRIDIUM) 100 MG  tablet Take 1 tablet (100 mg total) by mouth 3 (three) times daily as needed for pain. (Patient not taking: Reported on 02/22/2017) 30 tablet 0   No current facility-administered medications on file prior to visit.     Allergies  Allergen Reactions  . Ace Inhibitors Other (See Comments)    Reaction:  Unknown   . Other Swelling and Other (See Comments)    Pt states that she is allergic to artificial sweeteners.   Reaction:  Facial swelling   . Penicillins Other (See Comments)    Reaction:  Yeast infection  Has patient had a PCN reaction causing immediate rash,  facial/tongue/throat swelling, SOB or lightheadedness with hypotension: No Has patient had a PCN reaction causing severe rash involving mucus membranes or skin necrosis: No Has patient had a PCN reaction that required hospitalization No Has patient had a PCN reaction occurring within the last 10 years: No If all of the above answers are "NO", then may proceed with Cephalosporin use.     Objective:  BP 123/81   Pulse 73   Temp 98.5 F (36.9 C) (Oral)   Resp 16   Ht 5' 3.54" (1.614 m)   Wt 196 lb (88.9 kg)   SpO2 99%   BMI 34.13 kg/m   Physical Exam  Constitutional: She is oriented to person, place, and time and well-developed, well-nourished, and in no distress.  HENT:  Head: Normocephalic and atraumatic.  Eyes: Conjunctivae are normal.  Neck: Normal range of motion.  Cardiovascular: Normal rate, regular rhythm and normal heart sounds.  Pulmonary/Chest: Effort normal and breath sounds normal. She has no decreased breath sounds. She has no wheezes. She has no rhonchi. She has no rales.  Musculoskeletal:       Right lower leg: She exhibits no swelling.       Left lower leg: She exhibits no swelling.  Neurological: She is alert and oriented to person, place, and time. Gait normal.  Skin: Skin is warm and dry.  Psychiatric: Affect normal.  Vitals reviewed.    Dg Chest 2 View  Result Date: 02/22/2017 CLINICAL DATA:  82 year old female with a diagnosis of pneumonia 1 month previously. Follow-up evaluation. EXAM: CHEST  2 VIEW COMPARISON:  Prior chest x-ray 01/22/2017 FINDINGS: Stable cardiac and mediastinal contours with borderline cardiomegaly. The lungs are clear. No focal airspace consolidation, pulmonary edema, pleural effusion or pneumothorax. Stable mild bronchitic changes. No acute osseous abnormality. IMPRESSION: No active cardiopulmonary disease. Electronically Signed   By: Jacqulynn Cadet M.D.   On: 02/22/2017 11:12    Assessment and Plan :  1. Pneumonia of right lung  due to infectious organism, unspecified part of lung  Resolved.  Chest x-ray with no active cardiopulmonary disease.  Patient is well-appearing, no distress.  Vitals stable.  Lungs are CTAB.  Follow-up as needed. - DG Chest 2 View; Future  2. Cardiomegaly Plain film of the chest today shows borderline cardiomegaly, which is stable.  This has been seen on prior chest x-rays.  This was discussed with patient.  There are no signs of fluid overload on exam.  No rales noted on lung exam and no edema.  Denies SOB or DOE.  She is followed closely by cardiology and has a follow-up appointment in about 1 month.  Follow-up with cardiology as planned.   Tenna Delaine PA-C  [facility] 02/22/2017 11:23 AM

## 2017-02-22 NOTE — Patient Instructions (Addendum)
The prior infiltrate found on your chest x-ray has resolved.  It is reassuring that you are feeling better.  Your lungs sound great today.  Keep up the great work.  Continue to rest as needed.  I recommend increasing water consumption daily.    Follow-up as needed. Thank you for letting me participate in your health and well being.   IF you received an x-ray today, you will receive an invoice from [facility]. Please contact [facility] at [phone] with questions or concerns regarding your invoice.   IF you received labwork today, you will receive an invoice from [facility]. Please contact [facility] at [phone] with questions or concerns regarding your invoice.   Our billing staff will not be able to assist you with questions regarding bills from these companies.  You will be contacted with the lab results as soon as they are available. The fastest way to get your results is to activate your My Chart account. Instructions are located on the last page of this paperwork. If you have not heard from [place] regarding the results in 2 weeks, please contact this office.

## 2017-02-25 ENCOUNTER — Encounter: Payer: Self-pay | Admitting: Physician Assistant

## 2017-03-06 ENCOUNTER — Ambulatory Visit: Payer: Medicare Other

## 2017-03-06 ENCOUNTER — Ambulatory Visit: Payer: Medicare Other | Admitting: Physician Assistant

## 2017-03-10 ENCOUNTER — Other Ambulatory Visit: Payer: Self-pay

## 2017-03-10 ENCOUNTER — Ambulatory Visit (INDEPENDENT_AMBULATORY_CARE_PROVIDER_SITE_OTHER): Payer: Medicare Other | Admitting: Physician Assistant

## 2017-03-10 ENCOUNTER — Encounter: Payer: Self-pay | Admitting: Physician Assistant

## 2017-03-10 VITALS — BP 134/80 | HR 76 | Temp 97.8°F | Resp 18 | Ht 63.39 in | Wt 195.8 lb

## 2017-03-10 DIAGNOSIS — J189 Pneumonia, unspecified organism: Secondary | ICD-10-CM

## 2017-03-10 NOTE — Progress Notes (Signed)
Kathleen Pacheco  MRN: [id number] DOB: 20-Apr-1935  Subjective:  Kathleen Pacheco is a 82 y.o. female seen in office today for a chief complaint of follow-up on pneumonia.  Patient initially seen on 01/22/17 and was treated with course of antibiotics and prednisone and has been followed closely since.  Follow-up chest x-ray on 02/22/17 showed no active cardiopulmonary disease.  Please review past OV notes for additional details.  Today she notes she is 100% better.  Denies cough, fever, fatigue, shortness of breath, dyspnea on exertion, chest pain, wheezing, lower leg swelling, nausea, and vomiting.  No other questions or concerns today.  Review of Systems  Per HPI  Patient Active Problem List   Diagnosis Date Noted  . Acute bronchitis 03/18/2016  . Osteoarthrosis, hand 10/22/2013  . DIZZINESS 02/02/2009  . HYPERCHOLESTEROLEMIA 01/25/2009  . HYPERTENSION 01/25/2009    Current Outpatient Medications on File Prior to Visit  Medication Sig Dispense Refill  . albuterol (PROVENTIL HFA;VENTOLIN HFA) 108 (90 Base) MCG/ACT inhaler Inhale 2 puffs into the lungs every 4 (four) hours as needed for wheezing or shortness of breath (cough, shortness of breath or wheezing.). 1 Inhaler 1  . Alpha-D-Galactosidase (BEANO) TABS Take 2 tablets by mouth every 6 (six) hours as needed (for gas).    Marland Kitchen amLODipine (NORVASC) 5 MG tablet Take 5 mg by mouth daily.    Marland Kitchen aspirin EC 81 MG tablet Take 81 mg by mouth daily.    . B Complex-C (B-COMPLEX WITH VITAMIN C) tablet Take 1 tablet by mouth daily.    . cetirizine (ZYRTEC) 10 MG tablet Take 10 mg by mouth daily as needed for allergies.     . Guaifenesin (MUCINEX MAXIMUM STRENGTH) 1200 MG TB12 Take 1 tablet (1,200 mg total) by mouth every 12 (twelve) hours as needed. 20 tablet 0  . hydrochlorothiazide (HYDRODIURIL) 25 MG tablet Take 25 mg by mouth daily.    . Multiple Vitamins-Minerals (MULTIVITAMIN GUMMIES ADULT) CHEW Chew 3 each by mouth daily.    . phenazopyridine  (PYRIDIUM) 100 MG tablet Take 1 tablet (100 mg total) by mouth 3 (three) times daily as needed for pain. 30 tablet 0  . potassium chloride SA (K-DUR,KLOR-CON) 20 MEQ tablet Take 20 mEq by mouth daily.    . traMADol (ULTRAM) 50 MG tablet Take 50 mg by mouth every 6 (six) hours as needed for moderate pain.    . traZODone (DESYREL) 50 MG tablet Take 1 tablet (50 mg total) by mouth at bedtime. 90 tablet 1  . atorvastatin (LIPITOR) 40 MG tablet Take 40 mg by mouth daily.    . benzonatate (TESSALON) 100 MG capsule Take 1-2 capsules (100-200 mg total) by mouth 3 (three) times daily as needed for cough. (Patient not taking: Reported on 03/10/2017) 40 capsule 0  . doxycycline (VIBRAMYCIN) 100 MG capsule Take 1 capsule (100 mg total) by mouth 2 (two) times daily. (Patient not taking: Reported on 03/10/2017) 20 capsule 0  . hydrOXYzine (ATARAX/VISTARIL) 50 MG tablet TAKE 2 TABLETS (100 MG TOTAL) BY MOUTH AT BEDTIME AS NEEDED (INSOMNIA). (Patient not taking: Reported on 03/10/2017) 30 tablet 0  . predniSONE (DELTASONE) 20 MG tablet Take 2 tablets (40 mg total) by mouth daily with breakfast. (Patient not taking: Reported on 03/10/2017) 10 tablet 0  . promethazine-codeine (PHENERGAN WITH CODEINE) 6.25-10 MG/5ML syrup Take 5 mLs by mouth at bedtime as needed for cough. (Patient not taking: Reported on 03/10/2017) 120 mL 0  . triamcinolone cream (KENALOG) 0.1 % Apply  1 application topically 2 (two) times daily. (Patient not taking: Reported on 03/10/2017) 30 g 0  . TURMERIC PO Take 5 mLs by mouth daily.     No current facility-administered medications on file prior to visit.     Allergies  Allergen Reactions  . Ace Inhibitors Other (See Comments)    Reaction:  Unknown   . Other Swelling and Other (See Comments)    Pt states that she is allergic to artificial sweeteners.   Reaction:  Facial swelling   . Penicillins Other (See Comments)    Reaction:  Yeast infection  Has patient had a PCN reaction causing immediate  rash, facial/tongue/throat swelling, SOB or lightheadedness with hypotension: No Has patient had a PCN reaction causing severe rash involving mucus membranes or skin necrosis: No Has patient had a PCN reaction that required hospitalization No Has patient had a PCN reaction occurring within the last 10 years: No If all of the above answers are "NO", then may proceed with Cephalosporin use.     Objective:  BP 134/80 (BP Location: Left Arm, Patient Position: Sitting, Cuff Size: Normal)   Pulse 76   Temp 97.8 F (36.6 C) (Oral)   Resp 18   Ht 5' 3.39" (1.61 m)   Wt 195 lb 12.8 oz (88.8 kg)   SpO2 95%   BMI 34.26 kg/m   Physical Exam  Constitutional: She is oriented to person, place, and time and well-developed, well-nourished, and in no distress.  HENT:  Head: Normocephalic and atraumatic.  Eyes: Conjunctivae are normal.  Neck: Normal range of motion.  Cardiovascular: Normal rate, regular rhythm and normal heart sounds.  Pulmonary/Chest: Effort normal and breath sounds normal. She has no decreased breath sounds. She has no wheezes. She has no rhonchi. She has no rales.  Neurological: She is alert and oriented to person, place, and time. Gait normal.  Skin: Skin is warm and dry.  Psychiatric: Affect normal.  Vitals reviewed.   Assessment and Plan :  1. Pneumonia of right lung due to infectious organism, unspecified part of lung Resolved.  No active caridopulmonary disease noted on x-ray as of 02/22/17.  Patient is asymptomatic.  Follow-up as needed.   Tenna Delaine PA-C  [facility] 03/10/2017 11:30 AM

## 2017-03-10 NOTE — Patient Instructions (Signed)
     IF you received an x-ray today, you will receive an invoice from [facility]. Please contact [facility] at [phone] with questions or concerns regarding your invoice.   IF you received labwork today, you will receive an invoice from [facility]. Please contact [facility] at [phone] with questions or concerns regarding your invoice.   Our billing staff will not be able to assist you with questions regarding bills from these companies.  You will be contacted with the lab results as soon as they are available. The fastest way to get your results is to activate your My Chart account. Instructions are located on the last page of this paperwork. If you have not heard from us regarding the results in 2 weeks, please contact this office.     

## 2017-03-26 DIAGNOSIS — Z961 Presence of intraocular lens: Secondary | ICD-10-CM | POA: Diagnosis not present

## 2017-03-26 DIAGNOSIS — H43813 Vitreous degeneration, bilateral: Secondary | ICD-10-CM | POA: Diagnosis not present

## 2017-03-26 DIAGNOSIS — H04123 Dry eye syndrome of bilateral lacrimal glands: Secondary | ICD-10-CM | POA: Diagnosis not present

## 2017-03-31 DIAGNOSIS — R1031 Right lower quadrant pain: Secondary | ICD-10-CM | POA: Diagnosis not present

## 2017-03-31 DIAGNOSIS — R109 Unspecified abdominal pain: Secondary | ICD-10-CM | POA: Diagnosis not present

## 2017-03-31 DIAGNOSIS — Z1231 Encounter for screening mammogram for malignant neoplasm of breast: Secondary | ICD-10-CM | POA: Diagnosis not present

## 2017-04-01 DIAGNOSIS — M9902 Segmental and somatic dysfunction of thoracic region: Secondary | ICD-10-CM | POA: Diagnosis not present

## 2017-04-01 DIAGNOSIS — M5030 Other cervical disc degeneration, unspecified cervical region: Secondary | ICD-10-CM | POA: Diagnosis not present

## 2017-04-01 DIAGNOSIS — M9901 Segmental and somatic dysfunction of cervical region: Secondary | ICD-10-CM | POA: Diagnosis not present

## 2017-04-01 DIAGNOSIS — M9903 Segmental and somatic dysfunction of lumbar region: Secondary | ICD-10-CM | POA: Diagnosis not present

## 2017-04-17 DIAGNOSIS — R7309 Other abnormal glucose: Secondary | ICD-10-CM | POA: Diagnosis not present

## 2017-04-17 DIAGNOSIS — I361 Nonrheumatic tricuspid (valve) insufficiency: Secondary | ICD-10-CM | POA: Diagnosis not present

## 2017-04-17 DIAGNOSIS — M1990 Unspecified osteoarthritis, unspecified site: Secondary | ICD-10-CM | POA: Diagnosis not present

## 2017-04-17 DIAGNOSIS — I10 Essential (primary) hypertension: Secondary | ICD-10-CM | POA: Diagnosis not present

## 2017-04-17 DIAGNOSIS — I25118 Atherosclerotic heart disease of native coronary artery with other forms of angina pectoris: Secondary | ICD-10-CM | POA: Diagnosis not present

## 2017-04-17 DIAGNOSIS — E785 Hyperlipidemia, unspecified: Secondary | ICD-10-CM | POA: Diagnosis not present

## 2017-04-22 DIAGNOSIS — M9902 Segmental and somatic dysfunction of thoracic region: Secondary | ICD-10-CM | POA: Diagnosis not present

## 2017-04-22 DIAGNOSIS — M5030 Other cervical disc degeneration, unspecified cervical region: Secondary | ICD-10-CM | POA: Diagnosis not present

## 2017-04-22 DIAGNOSIS — M9903 Segmental and somatic dysfunction of lumbar region: Secondary | ICD-10-CM | POA: Diagnosis not present

## 2017-04-22 DIAGNOSIS — M9901 Segmental and somatic dysfunction of cervical region: Secondary | ICD-10-CM | POA: Diagnosis not present

## 2017-04-28 ENCOUNTER — Other Ambulatory Visit: Payer: Self-pay

## 2017-04-28 ENCOUNTER — Encounter: Payer: Self-pay | Admitting: Physician Assistant

## 2017-04-28 ENCOUNTER — Ambulatory Visit (INDEPENDENT_AMBULATORY_CARE_PROVIDER_SITE_OTHER): Payer: Medicare Other | Admitting: Physician Assistant

## 2017-04-28 VITALS — BP 132/82 | HR 82 | Temp 98.5°F | Resp 18 | Ht 63.19 in | Wt 192.8 lb

## 2017-04-28 DIAGNOSIS — R062 Wheezing: Secondary | ICD-10-CM

## 2017-04-28 DIAGNOSIS — J209 Acute bronchitis, unspecified: Secondary | ICD-10-CM | POA: Diagnosis not present

## 2017-04-28 MED ORDER — HYDROCODONE-HOMATROPINE 5-1.5 MG/5ML PO SYRP: 5.0000 mL | ORAL_SOLUTION | Freq: Three times a day (TID) | ORAL | 0 refills | Status: DC | PRN

## 2017-04-28 MED ORDER — ALBUTEROL SULFATE (2.5 MG/3ML) 0.083% IN NEBU
2.5000 mg | INHALATION_SOLUTION | Freq: Once | RESPIRATORY_TRACT | Status: AC
  Administered 2017-04-28: 2.5 mg via RESPIRATORY_TRACT

## 2017-04-28 MED ORDER — GUAIFENESIN ER 1200 MG PO TB12: 1.0000 | ORAL_TABLET | Freq: Two times a day (BID) | ORAL | 0 refills | Status: DC | PRN

## 2017-04-28 MED ORDER — IPRATROPIUM BROMIDE 0.02 % IN SOLN
0.5000 mg | Freq: Once | RESPIRATORY_TRACT | Status: AC
  Administered 2017-04-28: 0.5 mg via RESPIRATORY_TRACT

## 2017-04-28 MED ORDER — FLUTICASONE PROPIONATE 50 MCG/ACT NA SUSP: 2.0000 | Freq: Every day | NASAL | 0 refills | Status: DC

## 2017-04-28 NOTE — Patient Instructions (Addendum)
- This is likely due allergies or viral upper respiratory infection. Continue with zyrtec. Start flonase daily. - I recommend you rest, drink plenty of fluids, eat light meals including soups.  - You may use cough syrup at night for your cough , mucinex during the day. Be aware that cough syrup can definitely make you drowsy and sleepy so do not drive or operate any heavy machinery if it is affecting you during the day.  -Continue using albuterol inhaler every 4-6 hours for wheezing.  - Please let me know if you are not seeing any improvement or get worse in 5-7 days.    Bronchospasm, Adult Bronchospasm is a tightening of the airways going into the lungs. During an episode, it may be harder to breathe. You may cough, and you may make a whistling sound when you breathe (wheeze). This condition often affects people with asthma. What are the causes? This condition is caused by swelling and irritation in the airways. It can be triggered by:  An infection (common).  Seasonal allergies.  An allergic reaction.  Exercise.  Irritants. These include pollution, cigarette smoke, strong odors, aerosol sprays, and paint fumes.  Weather changes. Winds increase molds and pollens in the air. Cold air may cause swelling.  Stress and emotional upset.  What are the signs or symptoms? Symptoms of this condition include:  Wheezing. If the episode was triggered by an allergy, wheezing may start right away or hours later.  Nighttime coughing.  Frequent or severe coughing with a simple cold.  Chest tightness.  Shortness of breath.  Decreased ability to exercise.  How is this diagnosed? This condition is usually diagnosed with a review of your medical history and a physical exam. Tests, such as lung function tests, are sometimes done to look for other conditions. The need for a chest X-ray depends on where the wheezing occurs and whether it is the first time you have wheezed. How is this  treated? This condition may be treated with:  Inhaled medicines. These open up the airways and help you breathe. They can be taken with an inhaler or a nebulizer device.  Corticosteroid medicines. These may be given for severe bronchospasm, usually when it is associated with asthma.  Avoiding triggers, such as irritants, infection, or allergies.  Follow these instructions at home: Medicines  Take over-the-counter and prescription medicines only as told by your health care provider.  If you need to use an inhaler or nebulizer to take your medicine, ask your health care provider to explain how to use it correctly. If you were given a spacer, always use it with your inhaler. Lifestyle  Reduce the number of triggers in your home. To do this: ? Change your heating and air conditioning filter at least once a month. ? Limit your use of fireplaces and wood stoves. ? Do not smoke. Do not allow smoking in your home. ? Avoid using perfumes and fragrances. ? Get rid of pests, such as roaches and mice, and their droppings. ? Remove any mold from your home. ? Keep your house clean and dust free. Use unscented cleaning products. ? Replace carpet with wood, tile, or vinyl flooring. Carpet can trap dander and dust. ? Use allergy-proof pillows, mattress covers, and box spring covers. ? Wash bed sheets and blankets every week in hot water. Dry them in a dryer. ? Use blankets that are made of polyester or cotton. ? Wash your hands often. ? Do not allow pets in your bedroom.  Avoid  breathing in cold air when you exercise. General instructions  Have a plan for seeking medical care. Know when to call your health care provider and local emergency services, and where to get emergency care.  Stay up to date on your immunizations.  When you have an episode of bronchospasm, stay calm. Try to relax and breathe more slowly.  If you have asthma, make sure you have an asthma action plan.  Keep all  follow-up visits as told by your health care provider. This is important. Contact a health care provider if:  You have muscle aches.  You have chest pain.  The mucus that you cough up (sputum) changes from clear or white to yellow, green, gray, or bloody.  You have a fever.  Your sputum gets thicker. Get help right away if:  Your wheezing and coughing get worse, even after you take your prescribed medicines.  It gets even harder to breathe.  You develop severe chest pain. Summary  Bronchospasm is a tightening of the airways going into the lungs.  During an episode of bronchospasm, you may have a harder time breathing. You may cough and make a whistling sound when you breathe (wheeze).  Avoid exposure to triggers such as smoke, dust, mold, animal dander, and fragrances.  When you have an episode of bronchospasm, stay calm. Try to relax and breathe more slowly. This information is not intended to replace advice given to you by your health care provider. Make sure you discuss any questions you have with your health care provider. Document Released: 01/03/2003 Document Revised: 12/28/2015 Document Reviewed: 12/28/2015 Elsevier Interactive Patient Education  2017 [employer].   IF you received an x-ray today, you will receive an invoice from [facility]. Please contact [facility] at [phone] with questions or concerns regarding your invoice.   IF you received labwork today, you will receive an invoice from [facility]. Please contact [facility] at [phone] with questions or concerns regarding your invoice.   Our billing staff will not be able to assist you with questions regarding bills from these companies.  You will be contacted with the lab results as soon as they are available. The fastest way to get your results is to activate your My Chart account. Instructions are located on the last page of this paperwork. If you have not heard from [place] regarding the  results in 2 weeks, please contact this office.

## 2017-04-28 NOTE — Progress Notes (Signed)
MRN: [id number] DOB: [birth year]  Subjective:   Kathleen Pacheco is a 82 y.o. female presenting for chief complaint of Cough (x 2 days) .  Reports 2 day history of dry cough (no hemoptysis), wheezing and nasal congestion. Does not feel sick but just feels like her allergies are uncontrolled. Cough is keeping her up at night and she wants cough syrup with codeine in it so she can get adequate rest. Has tried zyrtec with relief. Has tried albuterol inhaler with some relief. Denies fever, sinus pain, sore throat, shortness of breath, chest pain and myalgia, night sweats, chills, fatigue, malaise, nausea, vomiting, abdominal pain and diarrhea. Has not had sick contact with anyone. Has history of seasonal allergies. No hx of asthma but notes she does get bronchospasms when she gets sick or has bad allergies. Denies smoking.  Denies any other aggravating or relieving factors, no other questions or concerns.  Kathleen Pacheco has a current medication list which includes the following prescription(s): albuterol, beano, amlodipine, aspirin ec, atorvastatin, b-complex with vitamin c, cetirizine, guaifenesin, hydrochlorothiazide, multivitamin gummies adult, potassium chloride sa, tramadol, turmeric, benzonatate, doxycycline, hydroxyzine, phenazopyridine, prednisone, promethazine-codeine, trazodone, and triamcinolone cream. Also is allergic to ace inhibitors; other; and penicillins.  Kathleen Pacheco  has a past medical history of Bradycardia, Gout, bladder infections, Hyperlipidemia, Hypertension, Migraine, Thyroid disease, and Vertigo. Also  has a past surgical history that includes Abdominal hysterectomy; Parathyroidectomy; Gallbladder surgery; and Cholecystectomy.  Social History   Socioeconomic History  . Marital status: Divorced    Spouse name: Not on file  . Number of children: 3  . Years of education: Not on file  . Highest education level: Not on file  Occupational History  . Not on file  Social Needs  .  Financial resource strain: Not on file  . Food insecurity:    Worry: Not on file    Inability: Not on file  . Transportation needs:    Medical: Not on file    Non-medical: Not on file  Tobacco Use  . Smoking status: Never Smoker  . Smokeless tobacco: Never Used  Substance and Sexual Activity  . Alcohol use: Yes    Alcohol/week: 0.6 oz    Types: 1 Glasses of wine per week  . Drug use: No  . Sexual activity: Not Currently    Birth control/protection: Surgical    Comment: HYST   Lifestyle  . Physical activity:    Days per week: Not on file    Minutes per session: Not on file  . Stress: Not on file  Relationships  . Social connections:    Talks on phone: Not on file    Gets together: Not on file    Attends religious service: Not on file    Active member of club or organization: Not on file    Attends meetings of clubs or organizations: Not on file    Relationship status: Not on file  . Intimate partner violence:    Fear of current or ex partner: Not on file    Emotionally abused: Not on file    Physically abused: Not on file    Forced sexual activity: Not on file  Other Topics Concern  . Not on file  Social History Narrative   Diet?      Do you drink/eat things with caffeine? Cup of macho- 1/week      Marital status?                divorced  What year were you married? 1964      Do you live in a house, apartment, assisted living, condo, trailer, etc.? house      Is it one or more stories? one      How many persons live in your home? 1       Do you have any pets in your home? (please list) no      Current or past profession: Retired Pharmacist, hospital       Do you exercise? Work sometimes                                     Type & how often?      Do you have a living will? no      Do you have a DNR form?   no                               If not, do you want to discuss one? no      Do you have signed POA/HPOA for forms?       Objective:    Vitals: BP 132/82 (BP Location: Right Arm, Patient Position: Sitting, Cuff Size: Normal)   Pulse 82   Temp 98.5 F (36.9 C) (Oral)   Resp 18   Ht 5' 3.19" (1.605 m)   Wt 192 lb 12.8 oz (87.5 kg)   SpO2 94%   PF 200 L/min Comment: post   1) 200   2) 100     3) 200  BMI 33.95 kg/m   Physical Exam  Constitutional: She is oriented to person, place, and time. She appears well-developed and well-nourished.  HENT:  Head: Normocephalic and atraumatic.  Right Ear: Tympanic membrane, external ear and ear canal normal.  Left Ear: Tympanic membrane, external ear and ear canal normal.  Nose: Mucosal edema (moderate bilaterally) present. Right sinus exhibits no maxillary sinus tenderness and no frontal sinus tenderness. Left sinus exhibits no maxillary sinus tenderness and no frontal sinus tenderness.  Mouth/Throat: Uvula is midline, oropharynx is clear and moist and mucous membranes are normal. No tonsillar exudate.  Eyes: Conjunctivae are normal.  Neck: Normal range of motion.  Cardiovascular: Normal rate, regular rhythm, normal heart sounds and intact distal pulses.  Pulmonary/Chest: Effort normal. No accessory muscle usage. No respiratory distress. She has no decreased breath sounds. She has wheezes (diffuse throghout bilateral lung fields). She has no rhonchi. She has no rales.  Lymphadenopathy:       Head (right side): No submental, no submandibular, no tonsillar, no preauricular, no posterior auricular and no occipital adenopathy present.       Head (left side): No submental, no submandibular, no tonsillar, no preauricular, no posterior auricular and no occipital adenopathy present.    She has no cervical adenopathy.       Right: No supraclavicular adenopathy present.       Left: No supraclavicular adenopathy present.  Neurological: She is alert and oriented to person, place, and time.  Skin: Skin is warm and dry.  Psychiatric: She has a normal mood and affect.  Vitals  reviewed.   No results found for this or any previous visit (from the past 24 hour(s)).   Breathing treatment of duoneb given in office. Patient reports feeling better. Wheezing resolved. Lungs CTAB.   Peak flow post breathing tx is 200, ~88%  of predicted. SpO2 increased from 94% pre breathing treatment to 96% post breathing treatment.    Assessment and Plan :  1. Acute bronchitis with bronchospasm Likely allergy induced. Could also be viral URI induced.  Vitals stable, pt is overall well appearing, no distress. Wheezing resolved with breathing treatment.  Recommended gaining better control over seasonal allergies.  Continue with Zyrtec.  Start using Flonase daily.  Use albuterol inhaler every 4-6 hours as needed for wheezing.  Provided patient with prescription of Hycodan.   Discussed risks and safety precautions of using Hycodan.  Patient voices her understanding.Follow-up here as needed. - Guaifenesin (MUCINEX MAXIMUM STRENGTH) 1200 MG TB12; Take 1 tablet (1,200 mg total) by mouth every 12 (twelve) hours as needed.  Dispense: 14 tablet; Refill: 0 - fluticasone (FLONASE) 50 MCG/ACT nasal spray; Place 2 sprays into both nostrils daily.  Dispense: 16 g; Refill: 0 - HYDROcodone-homatropine (HYCODAN) 5-1.5 MG/5ML syrup; Take 5 mLs by mouth every 8 (eight) hours as needed for cough.  Dispense: 75 mL; Refill: 0  2. Wheezing - albuterol (PROVENTIL) (2.5 MG/3ML) 0.083% nebulizer solution 2.5 mg - ipratropium (ATROVENT) nebulizer solution 0.5 mg - Care order/instruction  Tenna Delaine, PA-C  [facility] [place] 04/28/2017 1:18 PM

## 2017-04-29 ENCOUNTER — Encounter: Payer: Self-pay | Admitting: Physician Assistant

## 2017-05-05 DIAGNOSIS — M791 Myalgia, unspecified site: Secondary | ICD-10-CM | POA: Diagnosis not present

## 2017-05-05 DIAGNOSIS — R109 Unspecified abdominal pain: Secondary | ICD-10-CM | POA: Diagnosis not present

## 2017-05-07 ENCOUNTER — Telehealth: Payer: Self-pay | Admitting: General Practice

## 2017-05-07 DIAGNOSIS — J209 Acute bronchitis, unspecified: Secondary | ICD-10-CM

## 2017-05-07 NOTE — Telephone Encounter (Signed)
Copied from [place] [id number]. Topic: Quick Communication - Rx Refill/Question >> May 07, 2017  2:30 PM Arletha Grippe wrote: Medication: cough medication  Has the patient contacted their pharmacy? No. (Agent: If no, request that the patient contact the pharmacy for the refill.) Preferred Pharmacy (with phone number or street name): [facility] [address]  Agent: Please be advised that RX refills may take up to 3 business days. We ask that you follow-up with your pharmacy. Pt was seen for cough and congestion

## 2017-05-09 ENCOUNTER — Encounter: Payer: Self-pay | Admitting: Physician Assistant

## 2017-05-09 ENCOUNTER — Ambulatory Visit (INDEPENDENT_AMBULATORY_CARE_PROVIDER_SITE_OTHER): Payer: Medicare Other

## 2017-05-09 ENCOUNTER — Other Ambulatory Visit: Payer: Self-pay

## 2017-05-09 ENCOUNTER — Ambulatory Visit (INDEPENDENT_AMBULATORY_CARE_PROVIDER_SITE_OTHER): Payer: Medicare Other | Admitting: Physician Assistant

## 2017-05-09 VITALS — BP 100/60 | HR 70 | Temp 97.9°F | Resp 18 | Ht 63.19 in | Wt 192.6 lb

## 2017-05-09 DIAGNOSIS — R059 Cough, unspecified: Secondary | ICD-10-CM

## 2017-05-09 DIAGNOSIS — R05 Cough: Secondary | ICD-10-CM

## 2017-05-09 DIAGNOSIS — J4521 Mild intermittent asthma with (acute) exacerbation: Secondary | ICD-10-CM | POA: Diagnosis not present

## 2017-05-09 LAB — POCT CBC
Granulocyte percent: 38.8 %G (ref 37–80)
HEMATOCRIT: 42.1 % (ref 37.7–47.9)
Hemoglobin: 13.4 g/dL (ref 12.2–16.2)
Lymph, poc: 2.8 (ref 0.6–3.4)
MCH, POC: 26.1 pg — AB (ref 27–31.2)
MCHC: 31.9 g/dL (ref 31.8–35.4)
MCV: 81.8 fL (ref 80–97)
MID (CBC): 0.4 (ref 0–0.9)
MPV: 8.6 fL (ref 0–99.8)
POC GRANULOCYTE: 2.1 (ref 2–6.9)
POC LYMPH %: 53.6 % — AB (ref 10–50)
POC MID %: 7.6 % (ref 0–12)
Platelet Count, POC: 228 10*3/uL (ref 142–424)
RBC: 5.15 M/uL (ref 4.04–5.48)
RDW, POC: 15.2 %
WBC: 5.3 10*3/uL (ref 4.6–10.2)

## 2017-05-09 MED ORDER — PREDNISONE 20 MG PO TABS: 40.0000 mg | ORAL_TABLET | Freq: Every day | ORAL | 0 refills | Status: AC

## 2017-05-09 MED ORDER — IPRATROPIUM BROMIDE 0.02 % IN SOLN
0.5000 mg | Freq: Once | RESPIRATORY_TRACT | Status: AC
  Administered 2017-05-09: 0.5 mg via RESPIRATORY_TRACT

## 2017-05-09 MED ORDER — ALBUTEROL SULFATE (2.5 MG/3ML) 0.083% IN NEBU
2.5000 mg | INHALATION_SOLUTION | Freq: Once | RESPIRATORY_TRACT | Status: AC
  Administered 2017-05-09: 2.5 mg via RESPIRATORY_TRACT

## 2017-05-09 MED ORDER — BECLOMETHASONE DIPROP HFA 40 MCG/ACT IN AERB: 1.0000 | INHALATION_SPRAY | Freq: Two times a day (BID) | RESPIRATORY_TRACT | 0 refills | Status: DC

## 2017-05-09 MED ORDER — FLUTICASONE PROPIONATE 50 MCG/ACT NA SUSP: 2.0000 | Freq: Every day | NASAL | 6 refills | Status: DC

## 2017-05-09 NOTE — Telephone Encounter (Signed)
Hycodan cough syrup refill Last OV: 04/28/17 Last Refill:04/28/17 PCP: Timmothy Euler (last OV provider) Pharmacy: [id number] [address], [place] [phone] (Phone) [phone] (Fax)

## 2017-05-09 NOTE — Patient Instructions (Addendum)
This is likely an asthma exacerbation. I have given you an inhaler called QVAR to start twice daily. Continue with albuterol inhaler every 4-6 hours as needed. Continue with zyrtec and start flonase. We are going to treat your underlying inflammation with oral prednisone. Prednisone is a steroid and can cause side effects such as headache, irritability, nausea, vomiting, increased heart rate, increased blood pressure, increased blood sugar, appetite changes, and insomnia. Please take tablets in the morning with a full meal to help decrease the chances of these side effects.   Return on Monday for reevaluation. If any symptoms worsen, seek care immediately.   Chronic Bronchitis Chronic bronchitis is a lasting inflammation of the bronchial tubes, which are the tubes that carry air into your lungs. This is inflammation that occurs:  On most days of the week.  For at least three months at a time.  Over a period of two years in a row.  When the bronchial tubes are inflamed, they start to produce mucus. The inflammation and buildup of mucus make it more difficult to breathe. Chronic bronchitis is usually a permanent problem and is one type of chronic obstructive pulmonary disease (COPD). People with chronic bronchitis are at greater risk for getting repeated colds, or respiratory infections. What are the causes? Chronic bronchitis most often occurs in people who have:  Long-standing, severe asthma.  A history of smoking.  Asthma and who also smoke.  What are the signs or symptoms? Chronic bronchitis may cause the following:  A cough that brings up mucus (productive cough).  Shortness of breath.  Early morning headache.  Wheezing.  Chest discomfort.  Recurring respiratory infections.  How is this diagnosed? Your health care provider may confirm the diagnosis by:  Taking your medical history.  Performing a physical exam.  Taking a chest X-ray.  Performing pulmonary function  tests.  How is this treated? Treatment involves controlling symptoms with medicines, oxygen therapy, or making lifestyle changes, such as exercising and eating a healthy, well-balanced diet. Medicines could include:  Inhalers to improve air flow in and out of your lungs.  Antibiotics to treat bacterial infections, such as pneumonia, sinus infections, and acute bronchitis.  As a preventative measure, your health care provider may recommend routine vaccinations for influenza and pneumonia. This is to prevent infection and hospitalization since you may be more at risk for these types of infections. Follow these instructions at home:  Take medicines only as directed by your health care provider.  If you smoke cigarettes, chew tobacco, or use electronic cigarettes, quit. If you need help quitting, ask your health care provider.  Avoid pollen, dust, animal dander, molds, smoke, and other things that cause shortness of breath or wheezing attacks.  Talk to your health care provider about possible exercise routines. Regular exercise is very important to help you feel better.  If you are prescribed oxygen use at home follow these guidelines: ? Never smoke while using oxygen. Oxygen does not burn or explode, but flammable materials will burn faster in the presence of oxygen. ? Keep a Data processing manager close by. Let your fire department know that you have oxygen in your home. ? Warn visitors not to smoke near you when you are using oxygen. Put up "no smoking" signs in your home where you most often use the oxygen. ? Regularly test your smoke detectors at home to make sure they work. If you receive care in your home from a nurse or other health care provider, he or she  may also check to make sure your smoke detectors work.  Ask your health care provider whether you would benefit from a pulmonary rehabilitation program.  Do not wait to get medical care if you have any concerning symptoms. Delays could  cause permanent injury and may be life threatening. Contact a health care provider if:  You have increased coughing or shortness of breath or both.  You have muscle aches.  You have chest pain.  Your mucus gets thicker.  Your mucus changes from clear or white to yellow, green, gray, or bloody. Get help right away if:  Your usual medicines do not stop your wheezing.  You have increased difficulty breathing.  You have any problems with the medicine you are taking, such as a rash, itching, swelling, or trouble breathing. This information is not intended to replace advice given to you by your health care provider. Make sure you discuss any questions you have with your health care provider. Document Released: 10/18/2005 Document Revised: 05/11/2015 Document Reviewed: 02/08/2013 Elsevier Interactive Patient Education  2018 [employer].     IF you received an x-ray today, you will receive an invoice from [facility]. Please contact [facility] at [phone] with questions or concerns regarding your invoice.   IF you received labwork today, you will receive an invoice from [facility]. Please contact [facility] at [phone] with questions or concerns regarding your invoice.   Our billing staff will not be able to assist you with questions regarding bills from these companies.  You will be contacted with the lab results as soon as they are available. The fastest way to get your results is to activate your My Chart account. Instructions are located on the last page of this paperwork. If you have not heard from [place] regarding the results in 2 weeks, please contact this office.

## 2017-05-09 NOTE — Telephone Encounter (Signed)
Phone call to patient to discuss need for Hycodan syrup. She states she needs this for "The cough. Harsh cough. Reports sensation of choking with cough, states "When I cough, I feel like I'm choking." Admits to productive cough, white sputum. States she has been sleeping well.   Denies problems swallowing, or with shortness of breath. Appointment today at 1600. Patient will discuss with provider then.

## 2017-05-09 NOTE — Progress Notes (Addendum)
MRN: [id number] DOB: 05/22/1935  Subjective:   Kathleen Pacheco is a 82 y.o. female presenting for chief complaint of Cough Reports ~2 week history of worsening cough. It is mostly dry but will occasionally be productive.  Was seen on 04/28/17 when these sx began. Notes since that time her nasal congestion has improved, but cough has worsened. Has some associated wheezing and itchy watery eyes. Thinks it is all related to allergies. Taking zyrtec and mucinex. Tried hycodan cough syrup but notes it was not strong enough. Not taking flonase. Denies sinus pain, sore throat, shortness of breath, chest pain and myalgia, chills, nausea, vomiting, abdominal pain and diarrhea. Has not had sick contact with anyone. Has history of seasonal allergies. No hx of asthma or COPD but notes she does get bronchospasms when she gets sick or has bad allergies. Denies smoking. Had pneumonia in 01/2017. States this does not remind her of that.  Denies any other aggravating or relieving factors, no other questions or concerns.   Kathleen Pacheco has a current medication list which includes the following prescription(s): albuterol, beano, amlodipine, aspirin ec, atorvastatin, b-complex with vitamin c, benzonatate, cetirizine, doxycycline, fluticasone, guaifenesin, hydrochlorothiazide, hydrocodone-homatropine, hydroxyzine, multivitamin gummies adult, phenazopyridine, potassium chloride sa, prednisone, promethazine-codeine, tramadol, triamcinolone cream, turmeric, and trazodone. Also is allergic to ace inhibitors; other; and penicillins.  Kathleen Pacheco  has a past medical history of Bradycardia, Gout, bladder infections, Hyperlipidemia, Hypertension, Migraine, Thyroid disease, and Vertigo. Also  has a past surgical history that includes Abdominal hysterectomy; Parathyroidectomy; Gallbladder surgery; and Cholecystectomy.    Social History   Socioeconomic History  . Marital status: Divorced    Spouse name: Not on file  . Number of  children: 3  . Years of education: Not on file  . Highest education level: Not on file  Occupational History  . Not on file  Social Needs  . Financial resource strain: Not on file  . Food insecurity:    Worry: Not on file    Inability: Not on file  . Transportation needs:    Medical: Not on file    Non-medical: Not on file  Tobacco Use  . Smoking status: Never Smoker  . Smokeless tobacco: Never Used  Substance and Sexual Activity  . Alcohol use: Yes    Alcohol/week: 0.6 oz    Types: 1 Glasses of wine per week  . Drug use: No  . Sexual activity: Not Currently    Birth control/protection: Surgical    Comment: HYST   Lifestyle  . Physical activity:    Days per week: Not on file    Minutes per session: Not on file  . Stress: Not on file  Relationships  . Social connections:    Talks on phone: Not on file    Gets together: Not on file    Attends religious service: Not on file    Active member of club or organization: Not on file    Attends meetings of clubs or organizations: Not on file    Relationship status: Not on file  . Intimate partner violence:    Fear of current or ex partner: Not on file    Emotionally abused: Not on file    Physically abused: Not on file    Forced sexual activity: Not on file  Other Topics Concern  . Not on file  Social History Narrative   Diet?      Do you drink/eat things with caffeine? Cup of macho- 1/week  Marital status?                divorced                    What year were you married? 1964      Do you live in a house, apartment, assisted living, condo, trailer, etc.? house      Is it one or more stories? one      How many persons live in your home? 1       Do you have any pets in your home? (please list) no      Current or past profession: Retired Pharmacist, hospital       Do you exercise? Work sometimes                                     Type & how often?      Do you have a living will? no      Do you have a DNR form?   no                                If not, do you want to discuss one? no      Do you have signed POA/HPOA for forms?     Objective:   Vitals: BP 100/60   Pulse 70   Temp 97.9 F (36.6 C) (Oral)   Resp 18   Ht 5' 3.19" (1.605 m)   Wt 192 lb 9.6 oz (87.4 kg)   SpO2 98%   PF 200 L/min Comment: Post 1) 160    2)    200   3) 200  BMI 33.91 kg/m   Physical Exam  Constitutional: She is oriented to person, place, and time. She appears well-developed and well-nourished.  Appears like she does not feel well.   HENT:  Head: Normocephalic and atraumatic.  Right Ear: External ear and ear canal normal. There is drainage.  Left Ear: Tympanic membrane, external ear and ear canal normal.  Nose: Mucosal edema ( swollen boggy turbinates bilaterally) present. Right sinus exhibits no maxillary sinus tenderness and no frontal sinus tenderness. Left sinus exhibits no maxillary sinus tenderness and no frontal sinus tenderness.  Mouth/Throat: Uvula is midline and mucous membranes are normal. Posterior oropharyngeal erythema ( cobblestoning noted) present. No posterior oropharyngeal edema or tonsillar abscesses. No tonsillar exudate.  Eyes: Conjunctivae are normal.  Neck: Normal range of motion.  Cardiovascular: Normal rate, regular rhythm, normal heart sounds and intact distal pulses.  Pulmonary/Chest: Effort normal. No accessory muscle usage. No respiratory distress. She has no decreased breath sounds. She has wheezes (diffuse throughout bilateral lung fields). She has no rhonchi. She has no rales.  Lymphadenopathy:       Head (right side): No submental, no submandibular, no tonsillar, no preauricular, no posterior auricular and no occipital adenopathy present.       Head (left side): No submental, no submandibular, no tonsillar, no preauricular, no posterior auricular and no occipital adenopathy present.    She has no cervical adenopathy.       Right: No supraclavicular adenopathy present.       Left: No  supraclavicular adenopathy present.  Neurological: She is alert and oriented to person, place, and time.  Skin: Skin is warm and dry.  Psychiatric: She has a normal mood and  affect.  Vitals reviewed.   Results for orders placed or performed in visit on 05/09/17 (from the past 24 hour(s))  POCT CBC     Status: Abnormal   Collection Time: 05/09/17  5:47 PM  Result Value Ref Range   WBC 5.3 4.6 - 10.2 K/uL   Lymph, poc 2.8 0.6 - 3.4   POC LYMPH PERCENT 53.6 (A) 10 - 50 %L   MID (cbc) 0.4 0 - 0.9   POC MID % 7.6 0 - 12 %M   POC Granulocyte 2.1 2 - 6.9   Granulocyte percent 38.8 37 - 80 %G   RBC 5.15 4.04 - 5.48 M/uL   Hemoglobin 13.4 12.2 - 16.2 g/dL   HCT, POC 42.1 37.7 - 47.9 %   MCV 81.8 80 - 97 fL   MCH, POC 26.1 (A) 27 - 31.2 pg   MCHC 31.9 31.8 - 35.4 g/dL   RDW, POC 15.2 %   Platelet Count, POC 228 142 - 424 K/uL   MPV 8.6 0 - 99.8 fL   Dg Chest 2 View  Result Date: 05/09/2017 CLINICAL DATA:  Cough for 2 weeks with wheezing EXAM: CHEST - 2 VIEW COMPARISON:  02/22/2017, 03/18/2016 FINDINGS: No focal airspace disease or pleural effusion. Mild chronic appearing bronchitic changes. Mild cardiomegaly stable. Aortic atherosclerosis. No pneumothorax. Degenerative changes of the spine. IMPRESSION: No active cardiopulmonary disease. Mild cardiomegaly with chronic bronchitic changes. Electronically Signed   By: Donavan Foil M.D.   On: 05/09/2017 18:06   Two breathing treatments of  duoneb given in office. Patient reports much improvement after breathing txs. Lungs CTAB after 2nd breathing tx.  Peak flow increased from 200 (~88% of predicted) pre 2nd  breathing treatment to 240 (~100% of predicted) post 2nd breathing treatment.   Assessment and Plan :  1. Mild intermittent asthma with acute exacerbation Pt has no prior dx of asthma but due to hx, recurrent episodes of bronchospasms when she gets sick or has allergies, and improvement in peak flow with breathing tx, suspect mild  intermittent asthma,likely irritant induced. Breath sounds significantly improved post breathing tx and pt reports feeling much better. CXR with chronic bronchitic changes. WBC normal. Sp02 of 98%. Recommend short course prednisone taper. Start QVAR BID, start flonase, continue albuterol q 4-6 hrs prn, and continue zyrtec. Follow up in 3 days for reevaluation. Given strict ED precautions.  - beclomethasone (QVAR REDIHALER) 40 MCG/ACT inhaler; Inhale 1 puff into the lungs 2 (two) times daily.  Dispense: 10.6 g; Refill: 0 - predniSONE (DELTASONE) 20 MG tablet; Take 2 tablets (40 mg total) by mouth daily with breakfast for 5 days.  Dispense: 10 tablet; Refill: 0 - fluticasone (FLONASE) 50 MCG/ACT nasal spray; Place 2 sprays into both nostrils daily.  Dispense: 16 g; Refill: 6  2. Cough - albuterol (PROVENTIL) (2.5 MG/3ML) 0.083% nebulizer solution 2.5 mg - ipratropium (ATROVENT) nebulizer solution 0.5 mg - DG Chest 2 View; Future - POCT CBC - albuterol (PROVENTIL) (2.5 MG/3ML) 0.083% nebulizer solution 2.5 mg - ipratropium (ATROVENT) nebulizer solution 0.5 mg  Tenna Delaine, PA-C  [facility] 05/09/2017 6:12 PM

## 2017-05-12 ENCOUNTER — Ambulatory Visit (INDEPENDENT_AMBULATORY_CARE_PROVIDER_SITE_OTHER): Payer: Medicare Other | Admitting: Physician Assistant

## 2017-05-12 ENCOUNTER — Encounter: Payer: Self-pay | Admitting: Physician Assistant

## 2017-05-12 ENCOUNTER — Other Ambulatory Visit: Payer: Self-pay

## 2017-05-12 VITALS — BP 128/70 | HR 78 | Temp 97.7°F | Resp 16 | Ht 63.58 in | Wt 195.0 lb

## 2017-05-12 DIAGNOSIS — M9902 Segmental and somatic dysfunction of thoracic region: Secondary | ICD-10-CM | POA: Diagnosis not present

## 2017-05-12 DIAGNOSIS — J4521 Mild intermittent asthma with (acute) exacerbation: Secondary | ICD-10-CM | POA: Diagnosis not present

## 2017-05-12 DIAGNOSIS — M9903 Segmental and somatic dysfunction of lumbar region: Secondary | ICD-10-CM | POA: Diagnosis not present

## 2017-05-12 DIAGNOSIS — M5030 Other cervical disc degeneration, unspecified cervical region: Secondary | ICD-10-CM | POA: Diagnosis not present

## 2017-05-12 DIAGNOSIS — M9901 Segmental and somatic dysfunction of cervical region: Secondary | ICD-10-CM | POA: Diagnosis not present

## 2017-05-12 NOTE — Progress Notes (Signed)
Kathleen Pacheco  MRN: [id number] DOB: [birth year]  Subjective:  Kathleen Pacheco is a 82 y.o. female seen in office today for a chief complaint of follow-up on asthma exacerbation.  Patient was last seen on 05/09/2017 and had diffuse wheezing throughout bilateral lung fields.  She was given 2 breathing treatments of DuoNeb in office.  Treated with oral prednisone, albuterol inhaler every 4-6 hours, and newly prescribed Qvar twice daily.  Encouraged to continue with allergy medicine and start Flonase daily. Encouraged her to follow up in 3 days for reevaluation.  Please see that OV note for additional details.  Today, patient notes she is feeling much better.  She notes the cough is still there but has significantly improved.  Wheezing has resolved.  Nasal congestion and itchy watery eyes have also improved.  She is taking medications as prescribed.  Denies fever, chills, shortness of breath, chest pain, sinus pain, sore throat, body aches, chills, nausea, vomiting, abdominal pain, and diarrhea.  No other questions or concerns today.  Review of Systems  Per HPI  Patient Active Problem List   Diagnosis Date Noted  . Acute bronchitis 03/18/2016  . Osteoarthrosis, hand 10/22/2013  . DIZZINESS 02/02/2009  . HYPERCHOLESTEROLEMIA 01/25/2009  . HYPERTENSION 01/25/2009    Current Outpatient Medications on File Prior to Visit  Medication Sig Dispense Refill  . albuterol (PROVENTIL HFA;VENTOLIN HFA) 108 (90 Base) MCG/ACT inhaler Inhale 2 puffs into the lungs every 4 (four) hours as needed for wheezing or shortness of breath (cough, shortness of breath or wheezing.). 1 Inhaler 1  . Alpha-D-Galactosidase (BEANO) TABS Take 2 tablets by mouth every 6 (six) hours as needed (for gas).    Marland Kitchen amLODipine (NORVASC) 5 MG tablet Take 5 mg by mouth daily.    Marland Kitchen aspirin EC 81 MG tablet Take 81 mg by mouth daily.    Marland Kitchen atorvastatin (LIPITOR) 40 MG tablet Take 40 mg by mouth daily.    . B Complex-C (B-COMPLEX WITH  VITAMIN C) tablet Take 1 tablet by mouth daily.    . beclomethasone (QVAR REDIHALER) 40 MCG/ACT inhaler Inhale 1 puff into the lungs 2 (two) times daily. 10.6 g 0  . cetirizine (ZYRTEC) 10 MG tablet Take 10 mg by mouth daily as needed for allergies.     . fluticasone (FLONASE) 50 MCG/ACT nasal spray Place 2 sprays into both nostrils daily. 16 g 6  . Guaifenesin (MUCINEX MAXIMUM STRENGTH) 1200 MG TB12 Take 1 tablet (1,200 mg total) by mouth every 12 (twelve) hours as needed. 14 tablet 0  . hydrochlorothiazide (HYDRODIURIL) 25 MG tablet Take 25 mg by mouth daily.    . hydrOXYzine (ATARAX/VISTARIL) 50 MG tablet TAKE 2 TABLETS (100 MG TOTAL) BY MOUTH AT BEDTIME AS NEEDED (INSOMNIA). 30 tablet 0  . Multiple Vitamins-Minerals (MULTIVITAMIN GUMMIES ADULT) CHEW Chew 3 each by mouth daily.    . phenazopyridine (PYRIDIUM) 100 MG tablet Take 1 tablet (100 mg total) by mouth 3 (three) times daily as needed for pain. 30 tablet 0  . potassium chloride SA (K-DUR,KLOR-CON) 20 MEQ tablet Take 20 mEq by mouth daily.    . predniSONE (DELTASONE) 20 MG tablet Take 2 tablets (40 mg total) by mouth daily with breakfast for 5 days. 10 tablet 0  . promethazine-codeine (PHENERGAN WITH CODEINE) 6.25-10 MG/5ML syrup Take 5 mLs by mouth at bedtime as needed for cough. 120 mL 0  . traMADol (ULTRAM) 50 MG tablet Take 50 mg by mouth every 6 (six) hours as needed for  moderate pain.    Marland Kitchen triamcinolone cream (KENALOG) 0.1 % Apply 1 application topically 2 (two) times daily. 30 g 0  . TURMERIC PO Take 5 mLs by mouth daily.    . benzonatate (TESSALON) 100 MG capsule Take 1-2 capsules (100-200 mg total) by mouth 3 (three) times daily as needed for cough. (Patient not taking: Reported on 05/12/2017) 40 capsule 0  . doxycycline (VIBRAMYCIN) 100 MG capsule Take 1 capsule (100 mg total) by mouth 2 (two) times daily. (Patient not taking: Reported on 05/12/2017) 20 capsule 0  . HYDROcodone-homatropine (HYCODAN) 5-1.5 MG/5ML syrup Take 5 mLs by  mouth every 8 (eight) hours as needed for cough. (Patient not taking: Reported on 05/12/2017) 75 mL 0  . traZODone (DESYREL) 50 MG tablet Take 1 tablet (50 mg total) by mouth at bedtime. 90 tablet 1   No current facility-administered medications on file prior to visit.     Allergies  Allergen Reactions  . Ace Inhibitors Other (See Comments)    Reaction:  Unknown   . Other Swelling and Other (See Comments)    Pt states that she is allergic to artificial sweeteners.   Reaction:  Facial swelling   . Penicillins Other (See Comments)    Reaction:  Yeast infection  Has patient had a PCN reaction causing immediate rash, facial/tongue/throat swelling, SOB or lightheadedness with hypotension: No Has patient had a PCN reaction causing severe rash involving mucus membranes or skin necrosis: No Has patient had a PCN reaction that required hospitalization No Has patient had a PCN reaction occurring within the last 10 years: No If all of the above answers are "NO", then may proceed with Cephalosporin use.     Objective:  BP 128/70 (BP Location: Right Arm, Patient Position: Sitting, Cuff Size: Normal)   Pulse 78   Temp 97.7 F (36.5 C) (Oral)   Resp 16   Ht 5' 3.58" (1.615 m)   Wt 195 lb (88.5 kg)   SpO2 98%   BMI 33.91 kg/m   Physical Exam  Constitutional: She is oriented to person, place, and time. She appears well-developed and well-nourished. She does not have a sickly appearance. She does not appear ill. No distress.  Well groomed.   HENT:  Head: Normocephalic and atraumatic.  Nose: Mucosal edema (mild bilaterally) present. Right sinus exhibits no maxillary sinus tenderness and no frontal sinus tenderness. Left sinus exhibits no maxillary sinus tenderness and no frontal sinus tenderness.  Eyes: Conjunctivae are normal.  Neck: Normal range of motion.  Cardiovascular: Normal rate, regular rhythm and normal heart sounds.  Pulmonary/Chest: Effort normal and breath sounds normal. No  accessory muscle usage. No respiratory distress. She has no decreased breath sounds. She has no wheezes. She has no rhonchi. She has no rales.  Neurological: She is alert and oriented to person, place, and time.  Skin: Skin is warm and dry.  Psychiatric: She has a normal mood and affect.  Vitals reviewed.   Assessment and Plan :  1. Mild intermittent asthma with acute exacerbation Improving appropriately. Vitals stable.  Lungs are CTAB.  Patient appears like she feels much better compared to initial visit on 05/09/2017.  Recommended she continue with current medication regimen.  Follow-up as needed.  Tenna Delaine PA-C  [facility] 05/12/2017 3:40 PM

## 2017-05-12 NOTE — Patient Instructions (Addendum)
  Continue taking medication as prescribed.   I recommend doing QVAR during allergy season.   Follow up as needed. Thank you for letting me participate in your health and well being.    IF you received an x-ray today, you will receive an invoice from [facility]. Please contact [facility] at [phone] with questions or concerns regarding your invoice.   IF you received labwork today, you will receive an invoice from [facility]. Please contact [facility] at [phone] with questions or concerns regarding your invoice.   Our billing staff will not be able to assist you with questions regarding bills from these companies.  You will be contacted with the lab results as soon as they are available. The fastest way to get your results is to activate your My Chart account. Instructions are located on the last page of this paperwork. If you have not heard from [place] regarding the results in 2 weeks, please contact this office.

## 2017-05-13 ENCOUNTER — Encounter: Payer: Self-pay | Admitting: Physician Assistant

## 2017-05-19 IMAGING — CR DG LUMBAR SPINE COMPLETE 4+V
5 series · 5 of 5 positions shown · non-contrast
Comparison: 02/02/2010

CLINICAL DATA: Low back pain, right side sciatica

EXAM:
LUMBAR SPINE - COMPLETE 4+ VIEW

[AP]
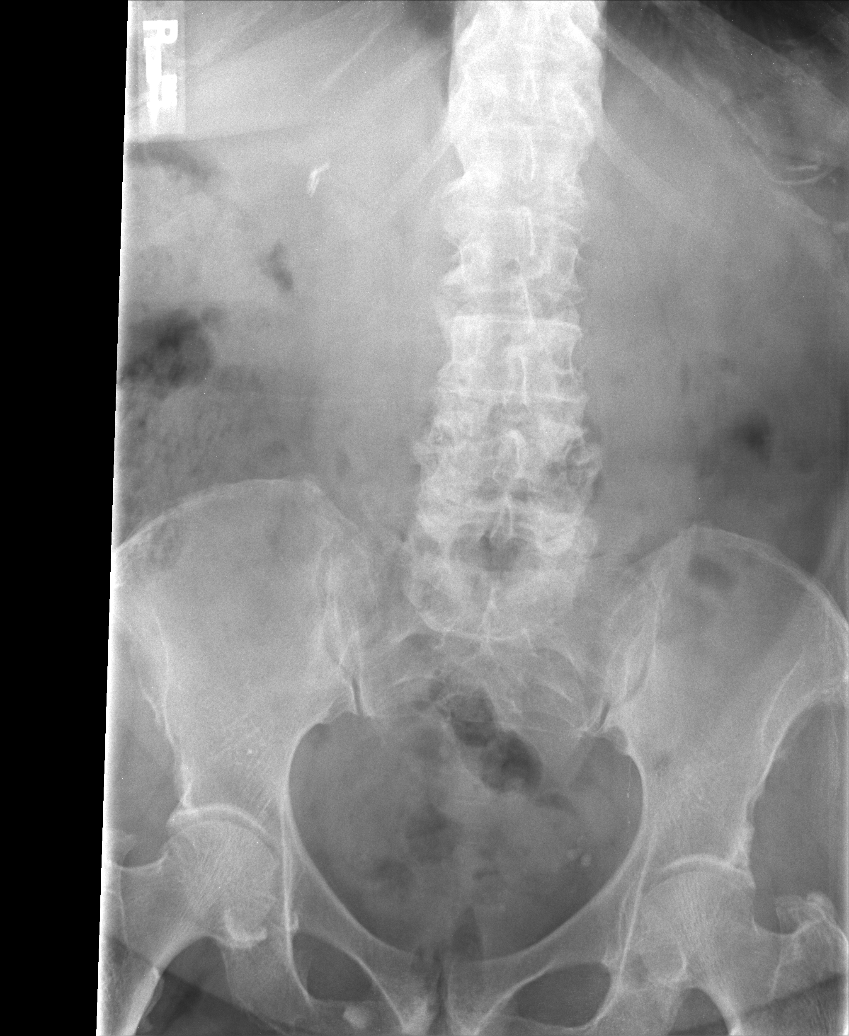

[lateral]
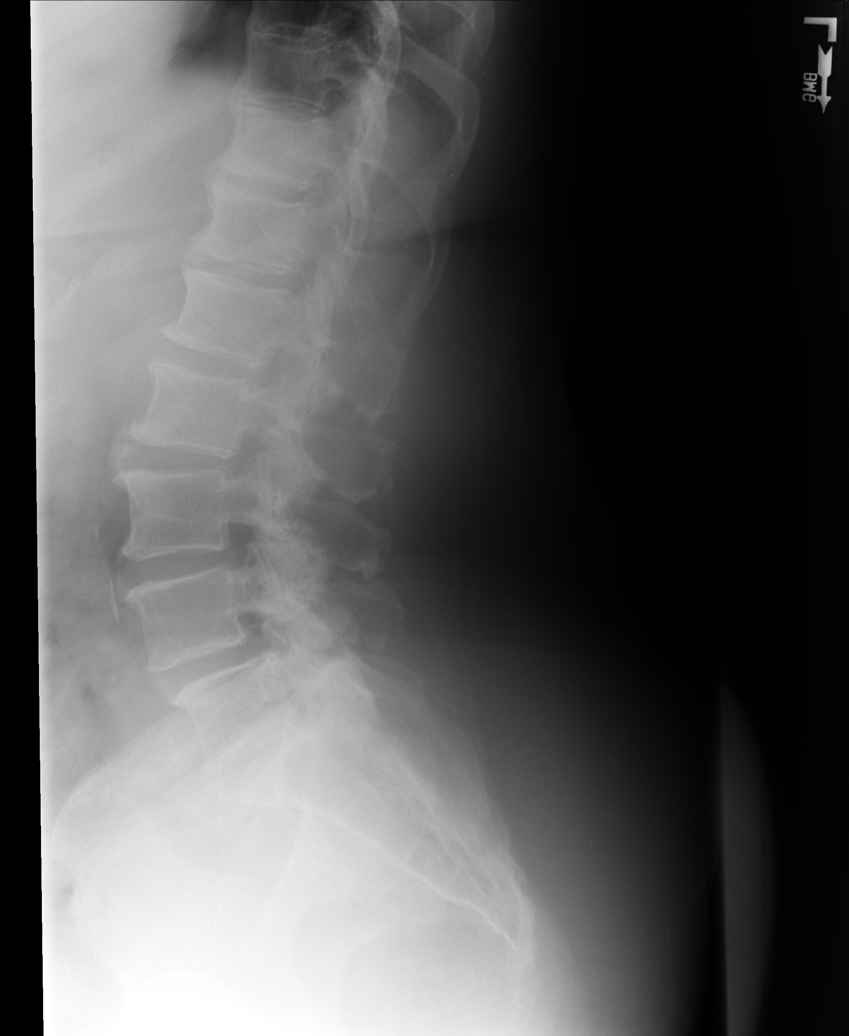

[l5 s1]
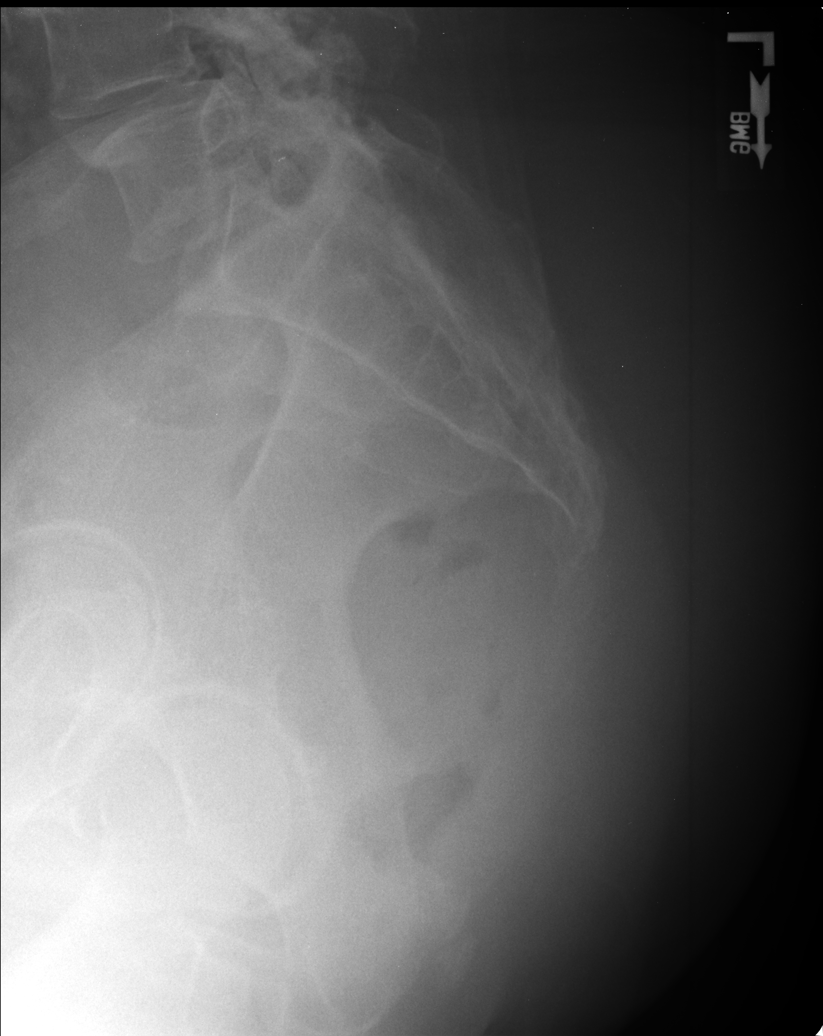

[rpo]
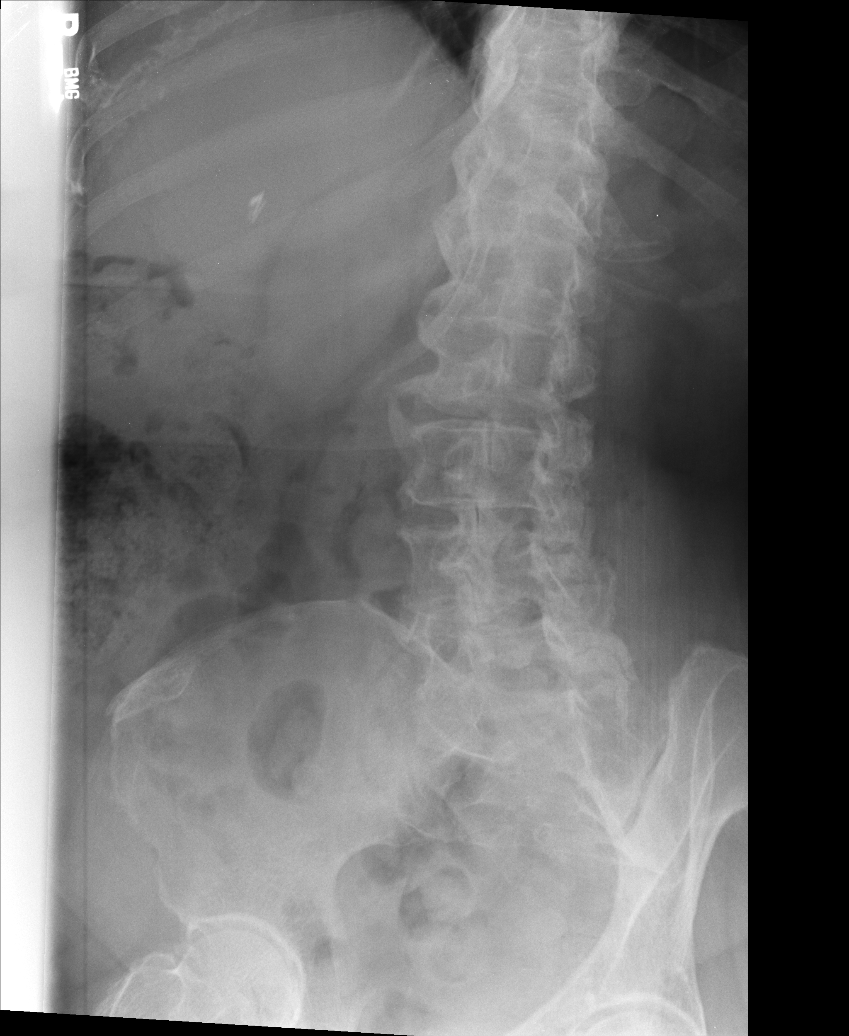

[lpo]
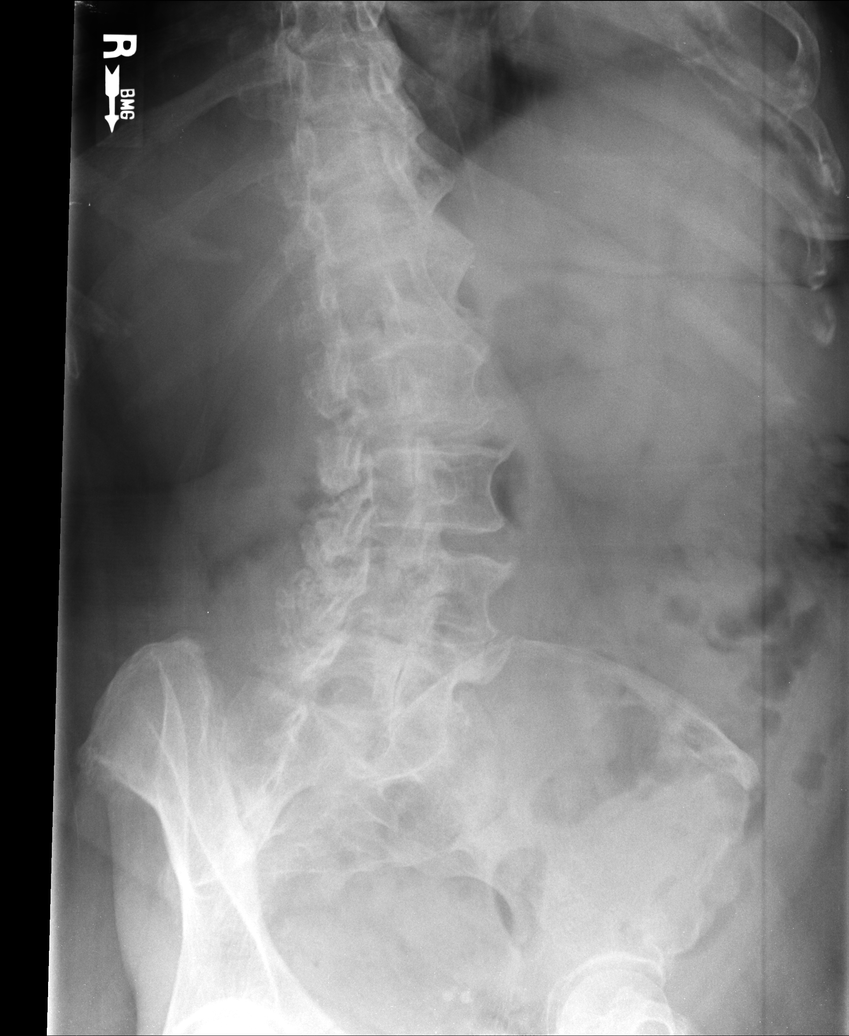

[5 of 5 positions shown; findings below may reference images not displayed]

FINDINGS: Five views of the lumbar spine submitted. Stable 9 mm sclerotic
focus probable bone island right inferior pubic ramus. No acute
fracture. There is about 9 mm anterolisthesis L4 on L5 vertebral
body. Mild disc space flattening with anterior spurring at L2-L3
level. Mild anterior spurring L1-L2 and L4-L5 level. Moderate disc
space flattening at L5-S1 level. Minimal disc space flattening with
anterior spurring at T12-L1 level. Mild disc space flattening with
anterior bridging osteophytes at T10-T11 and T11-T12 level. Facet
degenerative changes are noted L3-L4 and L5 level.
Postcholecystectomy surgical clips are noted.
IMPRESSION: 1. No acute fracture. Multilevel degenerative changes as described
above. There is about 9 mm anterolisthesis L4 on L5 vertebral body.
Facet degenerative changes L3-L4 and L5 level. Mild degenerative
changes lower thoracic spine. Moderate disc space flattening at
L5-S1 level.

## 2017-05-19 IMAGING — CR DG ABDOMEN 1V
2 series · 2 of 2 positions shown · non-contrast
Comparison: Abdominal radiographs dated 10/19/2014 and lumbar
radiographs dated 02/02/2010

CLINICAL DATA: Abdominal pain.

EXAM:
ABDOMEN - 1 VIEW

[AP (1 of 2)]
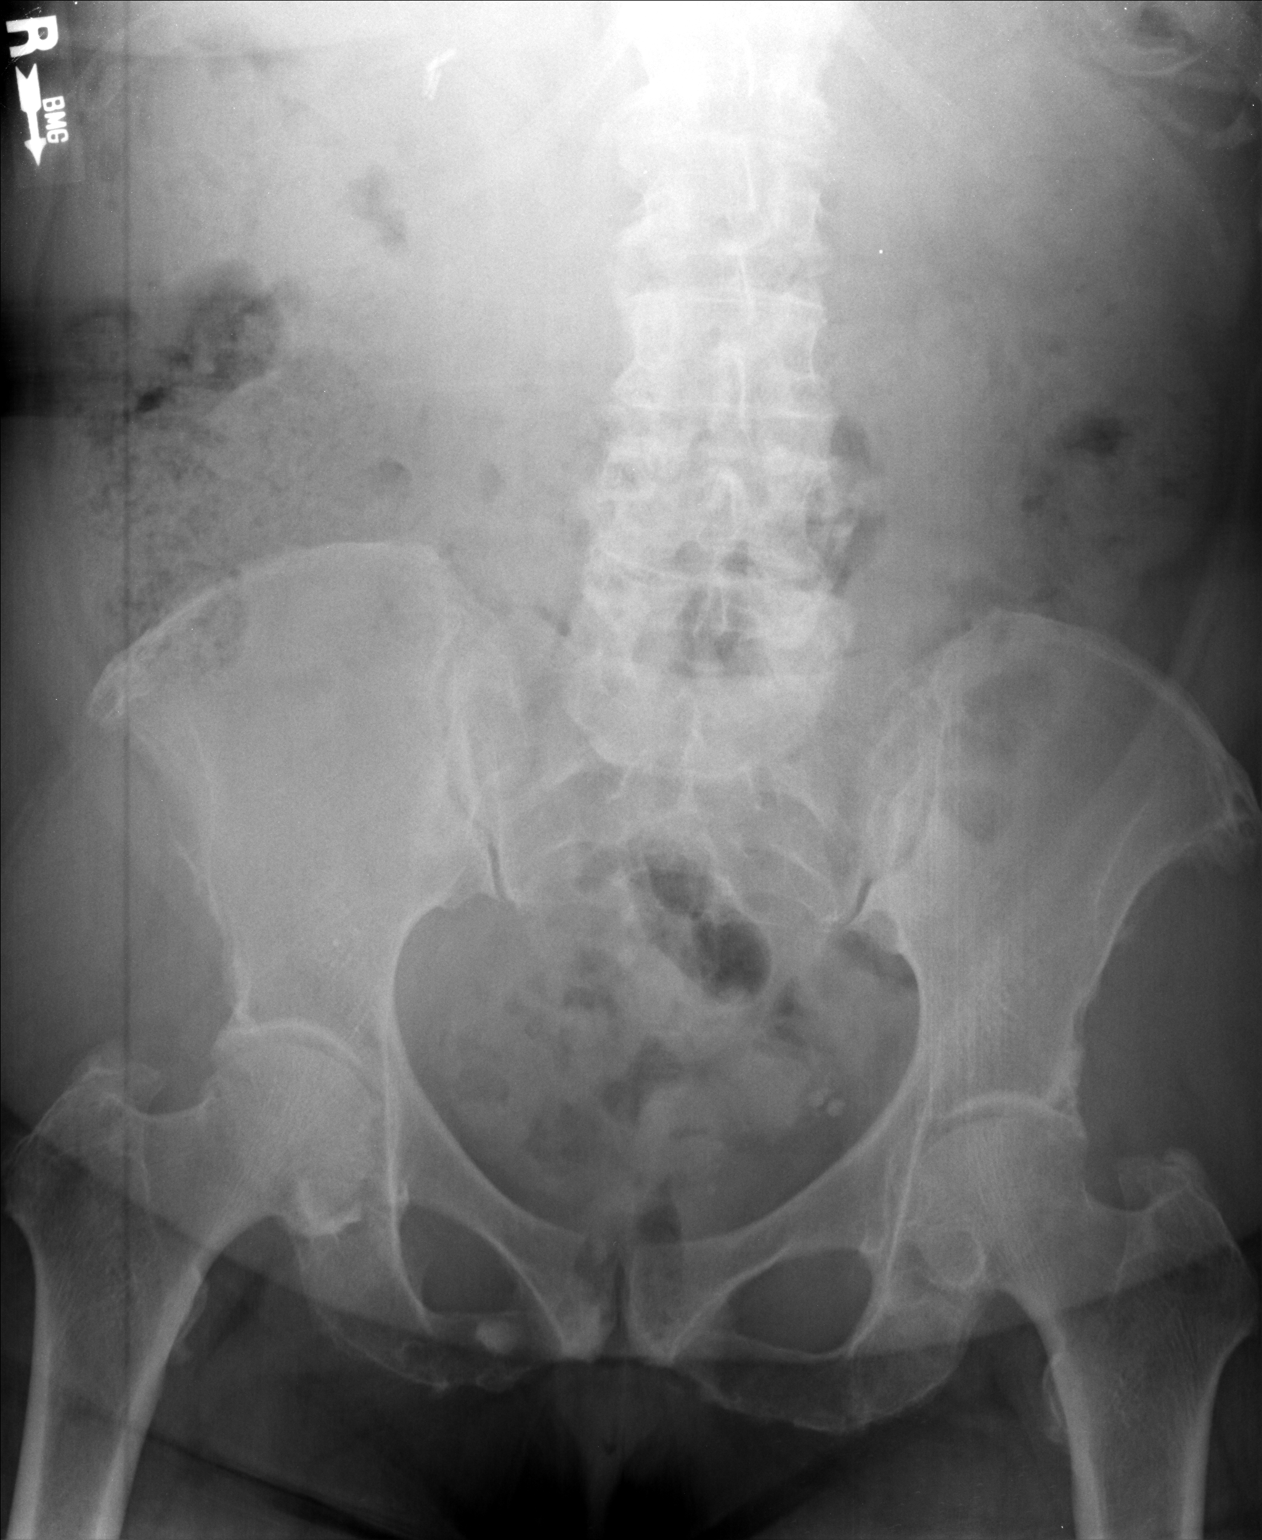

[AP (2 of 2)]
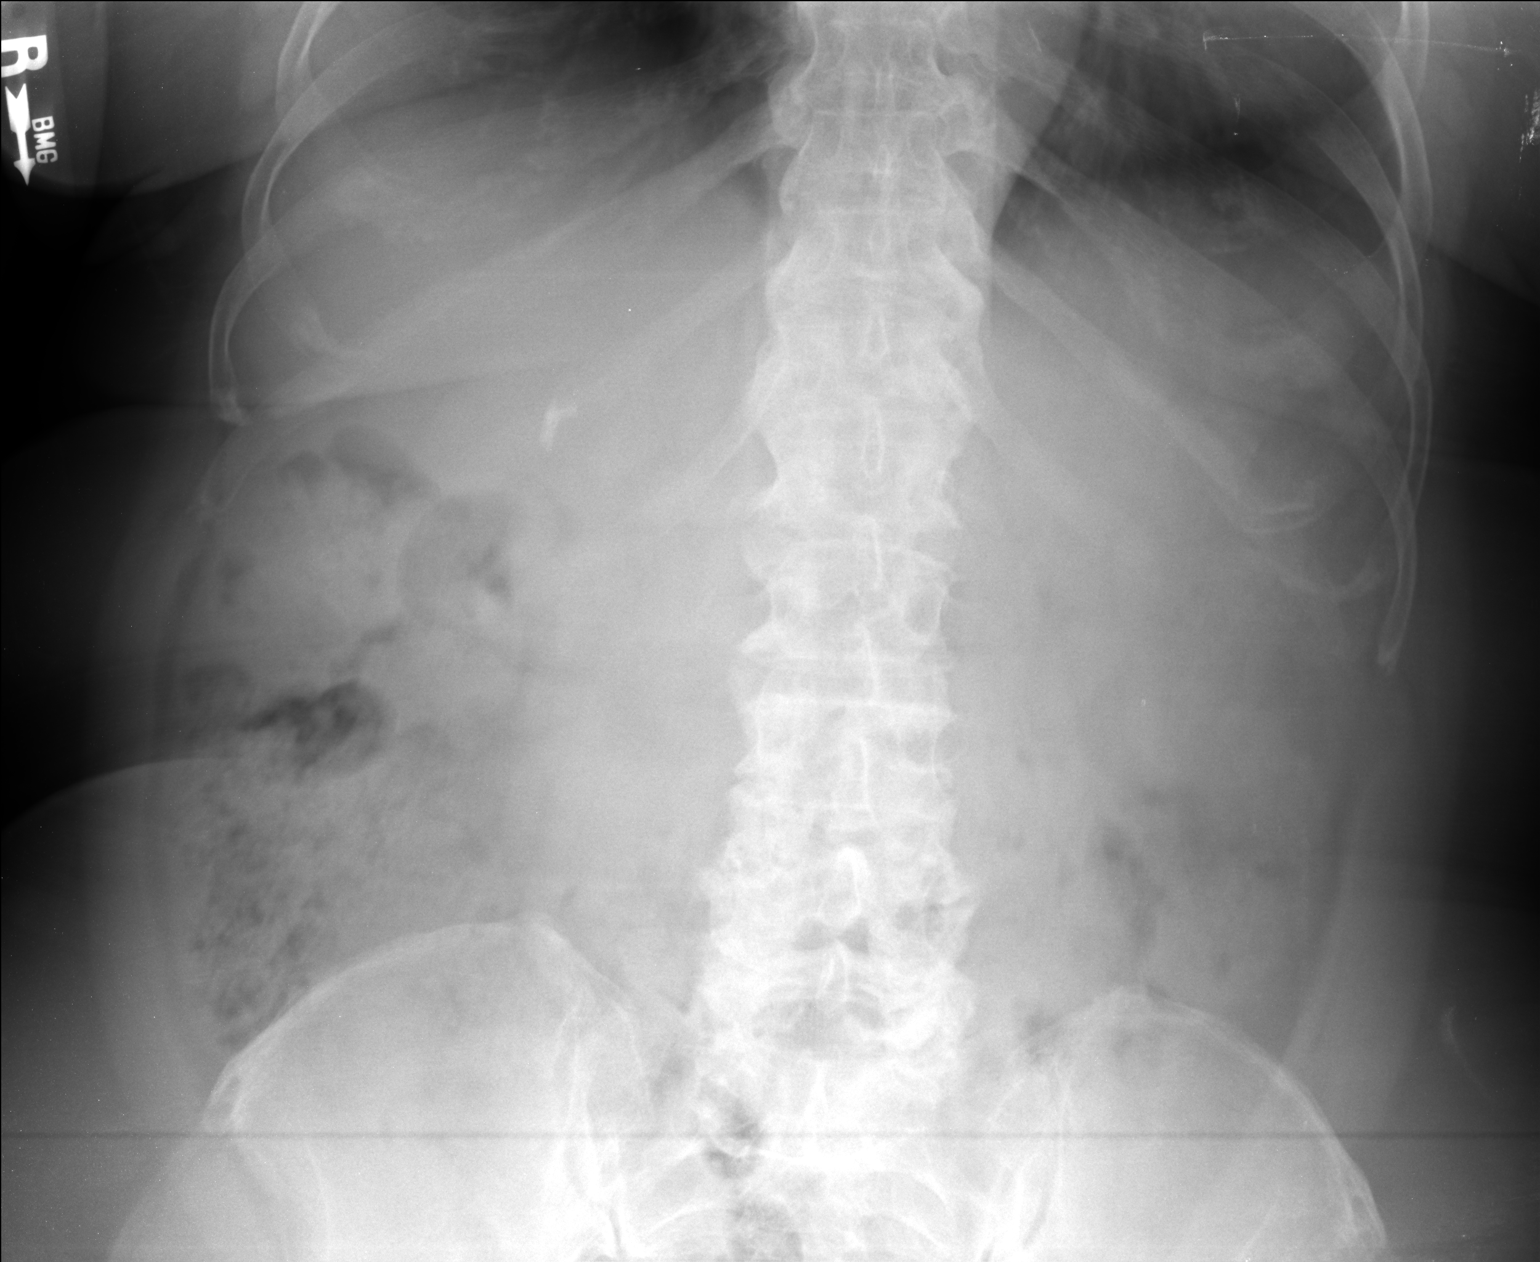

[2 of 2 positions shown; findings below may reference images not displayed]

FINDINGS: The bowel gas pattern is normal. Prior cholecystectomy. No
appreciable renal or ureteral calculi. Multiple phleboliths in the
pelvis.

Fairly severe degenerative facet arthritis in the lower lumbar
spine. Benign bone island in the right inferior pubic ramus,
unchanged since 4964. Sclerosis of the symphysis pubis, stable.
IMPRESSION: Benign appearing abdomen.

## 2017-05-26 DIAGNOSIS — M9902 Segmental and somatic dysfunction of thoracic region: Secondary | ICD-10-CM | POA: Diagnosis not present

## 2017-05-26 DIAGNOSIS — M5030 Other cervical disc degeneration, unspecified cervical region: Secondary | ICD-10-CM | POA: Diagnosis not present

## 2017-05-26 DIAGNOSIS — M9903 Segmental and somatic dysfunction of lumbar region: Secondary | ICD-10-CM | POA: Diagnosis not present

## 2017-05-26 DIAGNOSIS — M9901 Segmental and somatic dysfunction of cervical region: Secondary | ICD-10-CM | POA: Diagnosis not present

## 2017-06-03 DIAGNOSIS — M5030 Other cervical disc degeneration, unspecified cervical region: Secondary | ICD-10-CM | POA: Diagnosis not present

## 2017-06-03 DIAGNOSIS — M9903 Segmental and somatic dysfunction of lumbar region: Secondary | ICD-10-CM | POA: Diagnosis not present

## 2017-06-03 DIAGNOSIS — M9902 Segmental and somatic dysfunction of thoracic region: Secondary | ICD-10-CM | POA: Diagnosis not present

## 2017-06-03 DIAGNOSIS — M9901 Segmental and somatic dysfunction of cervical region: Secondary | ICD-10-CM | POA: Diagnosis not present

## 2017-06-30 ENCOUNTER — Other Ambulatory Visit: Payer: Self-pay | Admitting: Urgent Care

## 2017-06-30 ENCOUNTER — Encounter: Payer: Self-pay | Admitting: Physician Assistant

## 2017-06-30 ENCOUNTER — Ambulatory Visit (INDEPENDENT_AMBULATORY_CARE_PROVIDER_SITE_OTHER): Payer: Medicare Other | Admitting: Physician Assistant

## 2017-06-30 ENCOUNTER — Other Ambulatory Visit: Payer: Self-pay

## 2017-06-30 VITALS — BP 126/81 | HR 63 | Temp 97.9°F | Resp 18 | Ht 64.25 in | Wt 197.8 lb

## 2017-06-30 DIAGNOSIS — J4521 Mild intermittent asthma with (acute) exacerbation: Secondary | ICD-10-CM | POA: Diagnosis not present

## 2017-06-30 MED ORDER — ALBUTEROL SULFATE (2.5 MG/3ML) 0.083% IN NEBU
2.5000 mg | INHALATION_SOLUTION | Freq: Once | RESPIRATORY_TRACT | Status: AC
  Administered 2017-06-30: 2.5 mg via RESPIRATORY_TRACT

## 2017-06-30 MED ORDER — IPRATROPIUM BROMIDE 0.02 % IN SOLN
0.5000 mg | Freq: Once | RESPIRATORY_TRACT | Status: AC
  Administered 2017-06-30: 0.5 mg via RESPIRATORY_TRACT

## 2017-06-30 MED ORDER — MONTELUKAST SODIUM 10 MG PO TABS: 10.0000 mg | ORAL_TABLET | Freq: Every day | ORAL | 0 refills | Status: DC

## 2017-06-30 NOTE — Progress Notes (Addendum)
Kathleen Pacheco  MRN: [id number] DOB: 1935/08/16  Subjective:  Kathleen Pacheco is a 82 y.o. female w/ seasonal allergies and newly dx asthma in 04/2017 seen in office today for a chief complaint of dry cough and intermittent wheezing x 2 weeks. Last need breathing tx and oral pred in 04/2017. Was started on QVAR BID at that time. Pt notes she took it for a while but then stopped. It was helping while she was taking.  Not taking albuterol inhaler.  She also stopped her zyrtec and flonase because she didn't realize she needed them daily. Has associated nasal congestion, itchy watery eyes, and sneezing. Denies fever, productive cough, chest pain, sinus pain, sore throat, shortness of breath, DOE, orthopnea, myalgia, night sweats, chills, fatigue, malaise, nausea, vomiting, abdominal pain and diarrhea. Has not had sick contact with anyone. Denies smoking  Review of Systems  Per HPI   Patient Active Problem List   Diagnosis Date Noted  . Acute bronchitis 03/18/2016  . Osteoarthrosis, hand 10/22/2013  . DIZZINESS 02/02/2009  . HYPERCHOLESTEROLEMIA 01/25/2009  . HYPERTENSION 01/25/2009    Current Outpatient Medications on File Prior to Visit  Medication Sig Dispense Refill  . albuterol (PROVENTIL HFA;VENTOLIN HFA) 108 (90 Base) MCG/ACT inhaler Inhale 2 puffs into the lungs every 4 (four) hours as needed for wheezing or shortness of breath (cough, shortness of breath or wheezing.). 1 Inhaler 1  . Alpha-D-Galactosidase (BEANO) TABS Take 2 tablets by mouth every 6 (six) hours as needed (for gas).    Marland Kitchen amLODipine (NORVASC) 5 MG tablet Take 5 mg by mouth daily.    Marland Kitchen aspirin EC 81 MG tablet Take 81 mg by mouth daily.    Marland Kitchen atorvastatin (LIPITOR) 40 MG tablet Take 40 mg by mouth daily.    . B Complex-C (B-COMPLEX WITH VITAMIN C) tablet Take 1 tablet by mouth daily.    . beclomethasone (QVAR REDIHALER) 40 MCG/ACT inhaler Inhale 1 puff into the lungs 2 (two) times daily. 10.6 g 0  . cetirizine (ZYRTEC)  10 MG tablet Take 10 mg by mouth daily as needed for allergies.     . fluticasone (FLONASE) 50 MCG/ACT nasal spray Place 2 sprays into both nostrils daily. 16 g 6  . Guaifenesin (MUCINEX MAXIMUM STRENGTH) 1200 MG TB12 Take 1 tablet (1,200 mg total) by mouth every 12 (twelve) hours as needed. 14 tablet 0  . hydrochlorothiazide (HYDRODIURIL) 25 MG tablet Take 25 mg by mouth daily.    Marland Kitchen HYDROcodone-homatropine (HYCODAN) 5-1.5 MG/5ML syrup Take 5 mLs by mouth every 8 (eight) hours as needed for cough. 75 mL 0  . hydrOXYzine (ATARAX/VISTARIL) 50 MG tablet TAKE 2 TABLETS (100 MG TOTAL) BY MOUTH AT BEDTIME AS NEEDED (INSOMNIA). 30 tablet 0  . Multiple Vitamins-Minerals (MULTIVITAMIN GUMMIES ADULT) CHEW Chew 3 each by mouth daily.    . phenazopyridine (PYRIDIUM) 100 MG tablet Take 1 tablet (100 mg total) by mouth 3 (three) times daily as needed for pain. 30 tablet 0  . potassium chloride SA (K-DUR,KLOR-CON) 20 MEQ tablet Take 20 mEq by mouth daily.    . promethazine-codeine (PHENERGAN WITH CODEINE) 6.25-10 MG/5ML syrup Take 5 mLs by mouth at bedtime as needed for cough. 120 mL 0  . traMADol (ULTRAM) 50 MG tablet Take 50 mg by mouth every 6 (six) hours as needed for moderate pain.    Marland Kitchen triamcinolone cream (KENALOG) 0.1 % Apply 1 application topically 2 (two) times daily. 30 g 0  . TURMERIC PO Take 5 mLs  by mouth daily.    . benzonatate (TESSALON) 100 MG capsule Take 1-2 capsules (100-200 mg total) by mouth 3 (three) times daily as needed for cough. (Patient not taking: Reported on 05/12/2017) 40 capsule 0  . doxycycline (VIBRAMYCIN) 100 MG capsule Take 1 capsule (100 mg total) by mouth 2 (two) times daily. (Patient not taking: Reported on 05/12/2017) 20 capsule 0  . traZODone (DESYREL) 50 MG tablet Take 1 tablet (50 mg total) by mouth at bedtime. 90 tablet 1   No current facility-administered medications on file prior to visit.     Allergies  Allergen Reactions  . Ace Inhibitors Other (See Comments)     Reaction:  Unknown   . Other Swelling and Other (See Comments)    Pt states that she is allergic to artificial sweeteners.   Reaction:  Facial swelling   . Penicillins Other (See Comments)    Reaction:  Yeast infection  Has patient had a PCN reaction causing immediate rash, facial/tongue/throat swelling, SOB or lightheadedness with hypotension: No Has patient had a PCN reaction causing severe rash involving mucus membranes or skin necrosis: No Has patient had a PCN reaction that required hospitalization No Has patient had a PCN reaction occurring within the last 10 years: No If all of the above answers are "NO", then may proceed with Cephalosporin use.     Objective:  BP 126/81   Pulse 63   Temp 97.9 F (36.6 C) (Oral)   Resp 18   Ht 5' 4.25" (1.632 m)   Wt 197 lb 12.8 oz (89.7 kg)   SpO2 96%   PF 240 L/min Comment: Post 1)  180   2) 210   3) 240  BMI 33.69 kg/m   Physical Exam  Constitutional: She is oriented to person, place, and time. She appears well-developed and well-nourished. No distress.  HENT:  Head: Normocephalic and atraumatic.  Right Ear: External ear and ear canal normal. Tympanic membrane is not erythematous and not bulging. A middle ear effusion is present.  Left Ear: External ear and ear canal normal. Tympanic membrane is not erythematous and not bulging. A middle ear effusion is present.  Nose: Mucosal edema (swollen boggy turbinates b/l) present. Right sinus exhibits no maxillary sinus tenderness and no frontal sinus tenderness. Left sinus exhibits no maxillary sinus tenderness and no frontal sinus tenderness.  Mouth/Throat: Uvula is midline and mucous membranes are normal. Posterior oropharyngeal erythema ( cobblestoning noted) present. No posterior oropharyngeal edema or tonsillar abscesses. No tonsillar exudate.  Eyes: Pupils are equal, round, and reactive to light. EOM are normal. Right conjunctiva is injected (mild). Left conjunctiva is injected (mild).    Neck: Normal range of motion.  Cardiovascular: Normal rate, regular rhythm, normal heart sounds and intact distal pulses.  Pulmonary/Chest: Effort normal. She has no decreased breath sounds. She has wheezes (diffuse wheezes throughout bilateral lung fields). She has no rhonchi. She has no rales.  Lymphadenopathy:       Head (right side): No submental, no submandibular, no tonsillar, no preauricular, no posterior auricular and no occipital adenopathy present.       Head (left side): No submental, no submandibular, no tonsillar, no preauricular, no posterior auricular and no occipital adenopathy present.    She has no cervical adenopathy.       Right: No supraclavicular adenopathy present.       Left: No supraclavicular adenopathy present.  Neurological: She is alert and oriented to person, place, and time.  Skin: Skin is warm  and dry.  Psychiatric: She has a normal mood and affect.  Vitals reviewed.  One breathing tx of  duoneb given in office. Patient reports much improvement after breathing tx. Lungs CTAB after breathing tx, no wheezing auscultated.  Peak flow increased from 200 (~88% of predicted) pre breathing treatment to 240 (~100% of predicted) post breathing treatment.   Assessment and Plan :  1. Mild intermittent asthma with acute exacerbation Pt is overall well-appearing, no acute distress.  Vitals stable.  One breathing treatment because resolution of wheezing.  Peak flow increased to 100% of predicted value.  Patient reports feeling much better.  This is likely due to patient stopping her maintenance inhaler and allergy medication.  Educated patient that she needs to take her maintenance inhaler, Qvar, twice daily.  Use albuterol inhaler every 4-6 hours as needed for wheezing.  Take Zyrtec, Flonase, and newly Rx Singulair daily.  Considering wheezing completely resolved with one breathing treatment, do not think patient needs oral prednisone at this time.  Will have close follow-up in  48 hours.  Given strict return/ED precautions. - albuterol (PROVENTIL) (2.5 MG/3ML) 0.083% nebulizer solution 2.5 mg - ipratropium (ATROVENT) nebulizer solution 0.5 mg - Care order/instruction  Meds ordered this encounter  Medications  . albuterol (PROVENTIL) (2.5 MG/3ML) 0.083% nebulizer solution 2.5 mg  . ipratropium (ATROVENT) nebulizer solution 0.5 mg  . montelukast (SINGULAIR) 10 MG tablet    Sig: Take 1 tablet (10 mg total) by mouth at bedtime.    Dispense:  90 tablet    Refill:  0    Order Specific Question:   Supervising Provider    Answer:   Wardell Honour [[id number]]    Side effects, risks, benefits, and alternatives of the medications and treatment plan prescribed today were discussed, and patient expressed understanding of the instructions given. No barriers to understanding were identified. Red flags discussed in detail. Pt expressed understanding regarding what to do in case of emergency/urgent symptoms.   Tenna Delaine PA-C  [facility] 07/03/2017 9:23 AM

## 2017-06-30 NOTE — Patient Instructions (Addendum)
Please start QVAR twice daily. Continue with albuterol inhaler every 4-6 hours as needed. Start zyrtec and flonase daily. Start singulair daily as well. Return here on Wednesday for follow up. If any of your symptoms worsen or you develop new symptoms, return sooner. Thank you for letting me participate in your health and well being.  Asthma, Adult Asthma is a condition of the lungs in which the airways tighten and narrow. Asthma can make it hard to breathe. Asthma cannot be cured, but medicine and lifestyle changes can help control it. Asthma may be started (triggered) by:  Animal skin flakes (dander).  Dust.  Cockroaches.  Pollen.  Mold.  Smoke.  Cleaning products.  Hair sprays or aerosol sprays.  Paint fumes or strong smells.  Cold air, weather changes, and winds.  Crying or laughing hard.  Stress.  Certain medicines or drugs.  Foods, such as dried fruit, potato chips, and sparkling grape juice.  Infections or conditions (colds, flu).  Exercise.  Certain medical conditions or diseases.  Exercise or tiring activities.  Follow these instructions at home:  Take medicine as told by your doctor.  Use a peak flow meter as told by your doctor. A peak flow meter is a tool that measures how well the lungs are working.  Record and keep track of the peak flow meter's readings.  Understand and use the asthma action plan. An asthma action plan is a written plan for taking care of your asthma and treating your attacks.  To help prevent asthma attacks: ? Do not smoke. Stay away from secondhand smoke. ? Change your heating and air conditioning filter often. ? Limit your use of fireplaces and wood stoves. ? Get rid of pests (such as roaches and mice) and their droppings. ? Throw away plants if you see mold on them. ? Clean your floors. Dust regularly. Use cleaning products that do not smell. ? Have someone vacuum when you are not home. Use a vacuum cleaner with a HEPA  filter if possible. ? Replace carpet with wood, tile, or vinyl flooring. Carpet can trap animal skin flakes and dust. ? Use allergy-proof pillows, mattress covers, and box spring covers. ? Wash bed sheets and blankets every week in hot water and dry them in a dryer. ? Use blankets that are made of polyester or cotton. ? Clean bathrooms and kitchens with bleach. If possible, have someone repaint the walls in these rooms with mold-resistant paint. Keep out of the rooms that are being cleaned and painted. ? Wash hands often. Contact a doctor if:  You have make a whistling sound when breaking (wheeze), have shortness of breath, or have a cough even if taking medicine to prevent attacks.  The colored mucus you cough up (sputum) is thicker than usual.  The colored mucus you cough up changes from clear or white to yellow, green, gray, or bloody.  You have problems from the medicine you are taking such as: ? A rash. ? Itching. ? Swelling. ? Trouble breathing.  You need reliever medicines more than 2-3 times a week.  Your peak flow measurement is still at 50-79% of your personal best after following the action plan for 1 hour.  You have a fever. Get help right away if:  You seem to be worse and are not responding to medicine during an asthma attack.  You are short of breath even at rest.  You get short of breath when doing very little activity.  You have trouble eating, drinking, or  talking.  You have chest pain.  You have a fast heartbeat.  Your lips or fingernails start to turn blue.  You are light-headed, dizzy, or faint.  Your peak flow is less than 50% of your personal best. This information is not intended to replace advice given to you by your health care provider. Make sure you discuss any questions you have with your health care provider. Document Released: 06/19/2007 Document Revised: 06/08/2015 Document Reviewed: 07/30/2012 Elsevier Interactive Patient Education   2017 [employer].     IF you received an x-ray today, you will receive an invoice from [facility] Radiology. Please contact [facility]iology at [phone] with questions or concerns regarding your invoice.   IF you received labwork today, you will receive an invoice from [place]. Please contact [facility] at [phone] with questions or concerns regarding your invoice.   Our billing staff will not be able to assist you with questions regarding bills from these companies.  You will be contacted with the lab results as soon as they are available. The fastest way to get your results is to activate your My Chart account. Instructions are located on the last page of this paperwork. If you have not heard from [place] regarding the results in 2 weeks, please contact this office.

## 2017-07-02 ENCOUNTER — Ambulatory Visit (INDEPENDENT_AMBULATORY_CARE_PROVIDER_SITE_OTHER): Payer: Medicare Other | Admitting: Physician Assistant

## 2017-07-02 ENCOUNTER — Encounter: Payer: Self-pay | Admitting: Physician Assistant

## 2017-07-02 ENCOUNTER — Other Ambulatory Visit: Payer: Self-pay

## 2017-07-02 VITALS — BP 126/90 | HR 76 | Temp 97.7°F | Resp 20 | Ht 64.25 in | Wt 197.4 lb

## 2017-07-02 DIAGNOSIS — M79642 Pain in left hand: Secondary | ICD-10-CM | POA: Diagnosis not present

## 2017-07-02 DIAGNOSIS — J4521 Mild intermittent asthma with (acute) exacerbation: Secondary | ICD-10-CM | POA: Diagnosis not present

## 2017-07-02 DIAGNOSIS — M79641 Pain in right hand: Secondary | ICD-10-CM | POA: Diagnosis not present

## 2017-07-02 NOTE — Progress Notes (Signed)
Kathleen Pacheco  MRN: [id number] DOB: 1935/09/02  Subjective:  Kathleen Pacheco is a 82 y.o. female seen in office today for a chief complaint of f/u on asthma. Pt was last seen on 06/30/17. Had diffuse wheezing which was resolved with one duoneb. Rec she start taking QVAR BID, albuterol inhaler every 4-6 hrs prn, singulair, and restart zyrtec and flonase. Please see that note for additional details. Today, she notes she is feeling so much better. Has been taking all meds as prescribed. Still having some cough but nothing like it was. Nasal congestion has sig improved with flonase. Denies wheezing, SOB, DOE, chest tightness, fever, and chills. Is concerned that singulair caused joint pain flare up in right hand. Had to see orthopedist today for cortisone injection. No other questions or concerns.    Review of Systems  Per HPI  Patient Active Problem List   Diagnosis Date Noted  . Acute bronchitis 03/18/2016  . Osteoarthrosis, hand 10/22/2013  . DIZZINESS 02/02/2009  . HYPERCHOLESTEROLEMIA 01/25/2009  . HYPERTENSION 01/25/2009    Current Outpatient Medications on File Prior to Visit  Medication Sig Dispense Refill  . albuterol (PROVENTIL HFA;VENTOLIN HFA) 108 (90 Base) MCG/ACT inhaler Inhale 2 puffs into the lungs every 4 (four) hours as needed for wheezing or shortness of breath (cough, shortness of breath or wheezing.). 1 Inhaler 1  . Alpha-D-Galactosidase (BEANO) TABS Take 2 tablets by mouth every 6 (six) hours as needed (for gas).    Marland Kitchen amLODipine (NORVASC) 5 MG tablet Take 5 mg by mouth daily.    Marland Kitchen aspirin EC 81 MG tablet Take 81 mg by mouth daily.    Marland Kitchen atorvastatin (LIPITOR) 40 MG tablet Take 40 mg by mouth daily.    . B Complex-C (B-COMPLEX WITH VITAMIN C) tablet Take 1 tablet by mouth daily.    . beclomethasone (QVAR REDIHALER) 40 MCG/ACT inhaler Inhale 1 puff into the lungs 2 (two) times daily. 10.6 g 0  . cetirizine (ZYRTEC) 10 MG tablet Take 10 mg by mouth daily as needed for  allergies.     . fluticasone (FLONASE) 50 MCG/ACT nasal spray Place 2 sprays into both nostrils daily. 16 g 6  . Guaifenesin (MUCINEX MAXIMUM STRENGTH) 1200 MG TB12 Take 1 tablet (1,200 mg total) by mouth every 12 (twelve) hours as needed. 14 tablet 0  . hydrochlorothiazide (HYDRODIURIL) 25 MG tablet Take 25 mg by mouth daily.    Marland Kitchen HYDROcodone-homatropine (HYCODAN) 5-1.5 MG/5ML syrup Take 5 mLs by mouth every 8 (eight) hours as needed for cough. 75 mL 0  . hydrOXYzine (ATARAX/VISTARIL) 50 MG tablet TAKE 2 TABLETS (100 MG TOTAL) BY MOUTH AT BEDTIME AS NEEDED (INSOMNIA). 30 tablet 0  . montelukast (SINGULAIR) 10 MG tablet Take 1 tablet (10 mg total) by mouth at bedtime. 90 tablet 0  . Multiple Vitamins-Minerals (MULTIVITAMIN GUMMIES ADULT) CHEW Chew 3 each by mouth daily.    . phenazopyridine (PYRIDIUM) 100 MG tablet Take 1 tablet (100 mg total) by mouth 3 (three) times daily as needed for pain. 30 tablet 0  . potassium chloride SA (K-DUR,KLOR-CON) 20 MEQ tablet Take 20 mEq by mouth daily.    . promethazine-codeine (PHENERGAN WITH CODEINE) 6.25-10 MG/5ML syrup Take 5 mLs by mouth at bedtime as needed for cough. 120 mL 0  . traMADol (ULTRAM) 50 MG tablet Take 50 mg by mouth every 6 (six) hours as needed for moderate pain.    Marland Kitchen triamcinolone cream (KENALOG) 0.1 % Apply 1 application topically 2 (two)  times daily. 30 g 0  . TURMERIC PO Take 5 mLs by mouth daily.    . benzonatate (TESSALON) 100 MG capsule Take 1-2 capsules (100-200 mg total) by mouth 3 (three) times daily as needed for cough. (Patient not taking: Reported on 05/12/2017) 40 capsule 0  . doxycycline (VIBRAMYCIN) 100 MG capsule Take 1 capsule (100 mg total) by mouth 2 (two) times daily. (Patient not taking: Reported on 05/12/2017) 20 capsule 0  . traZODone (DESYREL) 50 MG tablet Take 1 tablet (50 mg total) by mouth at bedtime. 90 tablet 1   No current facility-administered medications on file prior to visit.     Allergies  Allergen  Reactions  . Ace Inhibitors Other (See Comments)    Reaction:  Unknown   . Other Swelling and Other (See Comments)    Pt states that she is allergic to artificial sweeteners.   Reaction:  Facial swelling   . Penicillins Other (See Comments)    Reaction:  Yeast infection  Has patient had a PCN reaction causing immediate rash, facial/tongue/throat swelling, SOB or lightheadedness with hypotension: No Has patient had a PCN reaction causing severe rash involving mucus membranes or skin necrosis: No Has patient had a PCN reaction that required hospitalization No Has patient had a PCN reaction occurring within the last 10 years: No If all of the above answers are "NO", then may proceed with Cephalosporin use.     Objective:  BP 126/90   Pulse 76   Temp 97.7 F (36.5 C) (Oral)   Resp 20   Ht 5' 4.25" (1.632 m)   Wt 197 lb 6.4 oz (89.5 kg)   SpO2 97%   BMI 33.62 kg/m   Physical Exam  Constitutional: She is oriented to person, place, and time. She appears well-developed and well-nourished. No distress.  HENT:  Head: Normocephalic and atraumatic.  Nose: Mucosal edema (moderate b/l, improved since initial visit) present.  Eyes: Conjunctivae are normal.  Neck: Normal range of motion.  Cardiovascular: Normal rate, regular rhythm, normal heart sounds and intact distal pulses.  Pulmonary/Chest: Effort normal and breath sounds normal. No accessory muscle usage or stridor. No respiratory distress. She has no decreased breath sounds. She has no wheezes. She has no rhonchi. She has no rales.  Neurological: She is alert and oriented to person, place, and time.  Skin: Skin is warm and dry.  Psychiatric: She has a normal mood and affect.  Vitals reviewed.  Peak flow reading is 240, about 100 % of predicted.  Assessment and Plan :  1. Mild intermittent asthma with acute exacerbation Pt is overall well appearing, NAD. Vitals stable. She has responded greatly to medication regimen. Lungs CTAB, no  wheezing or diminished breath sounds noted. Peak flow is at 100% today. Recommend she continue with QVAR BID, use albuterol inhaler only prn, and continue with daily zyrtec and flonase. Advised pt that it is unlikely that singulair caused her right hand joint pain but she can d/c singulair at this time if she is anxious about taking it. Follow up with me as needed.   Tenna Delaine PA-C  [facility] 07/02/2017 6:37 PM

## 2017-07-02 NOTE — Patient Instructions (Addendum)
Continue taking QVAR twice daily. You can use albuterol inhaler as needed. Continue with zyrtec and flonase. Stop singulair and see if this helps with your joint pain. Follow up as needed. Thank you for letting me participate in your health and well being.   IF you received an x-ray today, you will receive an invoice from [facility] Rad[facility]. Please contact [facility] Radiology at [phone] with questions or concerns regarding your invoice.   IF you received labwork today, you will receive an invoice from [place]. Please contact [facility] at [phone] with questions or concerns regarding your invoice.   Our billing staff will not be able to assist you with questions regarding bills from these companies.  You will be contacted with the lab results as soon as they are available. The fastest way to get your results is to activate your My Chart account. Instructions are located on the last page of this paperwork. If you have not heard from [place] regarding the results in 2 weeks, please contact this office.

## 2017-07-03 ENCOUNTER — Encounter: Payer: Self-pay | Admitting: Physician Assistant

## 2017-07-08 DIAGNOSIS — M9901 Segmental and somatic dysfunction of cervical region: Secondary | ICD-10-CM | POA: Diagnosis not present

## 2017-07-08 DIAGNOSIS — M5030 Other cervical disc degeneration, unspecified cervical region: Secondary | ICD-10-CM | POA: Diagnosis not present

## 2017-07-08 DIAGNOSIS — M9903 Segmental and somatic dysfunction of lumbar region: Secondary | ICD-10-CM | POA: Diagnosis not present

## 2017-07-08 DIAGNOSIS — M9902 Segmental and somatic dysfunction of thoracic region: Secondary | ICD-10-CM | POA: Diagnosis not present

## 2017-07-11 ENCOUNTER — Other Ambulatory Visit: Payer: Self-pay | Admitting: Emergency Medicine

## 2017-07-11 DIAGNOSIS — G4700 Insomnia, unspecified: Secondary | ICD-10-CM

## 2017-07-24 DIAGNOSIS — R7309 Other abnormal glucose: Secondary | ICD-10-CM | POA: Diagnosis not present

## 2017-07-24 DIAGNOSIS — I25118 Atherosclerotic heart disease of native coronary artery with other forms of angina pectoris: Secondary | ICD-10-CM | POA: Diagnosis not present

## 2017-07-24 DIAGNOSIS — M791 Myalgia, unspecified site: Secondary | ICD-10-CM | POA: Diagnosis not present

## 2017-07-24 DIAGNOSIS — M1990 Unspecified osteoarthritis, unspecified site: Secondary | ICD-10-CM | POA: Diagnosis not present

## 2017-07-24 DIAGNOSIS — E785 Hyperlipidemia, unspecified: Secondary | ICD-10-CM | POA: Diagnosis not present

## 2017-07-24 DIAGNOSIS — I361 Nonrheumatic tricuspid (valve) insufficiency: Secondary | ICD-10-CM | POA: Diagnosis not present

## 2017-07-24 DIAGNOSIS — I10 Essential (primary) hypertension: Secondary | ICD-10-CM | POA: Diagnosis not present

## 2017-08-01 DIAGNOSIS — M25551 Pain in right hip: Secondary | ICD-10-CM | POA: Diagnosis not present

## 2017-08-09 DIAGNOSIS — M5030 Other cervical disc degeneration, unspecified cervical region: Secondary | ICD-10-CM | POA: Diagnosis not present

## 2017-08-09 DIAGNOSIS — M9901 Segmental and somatic dysfunction of cervical region: Secondary | ICD-10-CM | POA: Diagnosis not present

## 2017-08-09 DIAGNOSIS — M9902 Segmental and somatic dysfunction of thoracic region: Secondary | ICD-10-CM | POA: Diagnosis not present

## 2017-08-09 DIAGNOSIS — M9903 Segmental and somatic dysfunction of lumbar region: Secondary | ICD-10-CM | POA: Diagnosis not present

## 2017-09-12 DIAGNOSIS — M79641 Pain in right hand: Secondary | ICD-10-CM | POA: Diagnosis not present

## 2017-09-26 ENCOUNTER — Other Ambulatory Visit: Payer: Self-pay | Admitting: Physician Assistant

## 2017-10-01 NOTE — Progress Notes (Signed)
MRN: [id number] DOB: [birth year]  Subjective:   Kathleen Pacheco is a 82 y.o. female presenting for chief complaint of Cough and Urinary Tract Infection (frequncey ) . 1 month history of urinary frequency.  Denies urinary urgency, suprapubic pain, hematuria, vaginal discharge, polydipsia, polyphagia, fever, chills, nausea, vomiting.  Denies stress and urge incontinence.  Has not tried anything for relief.  Does drink lots of tea.  Has not had a UTI in quite some time.  She is not currently sexually active.  She also needs refill for her albuterol inhaler.  She is only been using Qvar once daily instead of twice daily.  Often forgets in the afternoon.  Has been using albuterol 4 times a week when she has a coughing or wheezing episode.  Currently asymptomatic.  She has not taken Zyrtec in a while.Denies fever, productive cough, chest pain, sinus pain, sore throat, shortness of breath, DOE, orthopnea, myalgia,night sweats, chills, fatigue, malaise, nausea, vomiting, abdominal pain and diarrhea. Hasnothad sick contact with anyone. Denies smoking.  Would also like refill for trazodone.  Uses this to sleep.  Takes 1 tablet at nighttime.  Has 3 tablets left.  Tolerates medication very well.  Denies headache, dizziness, nausea, confusion, tremor, and elevated blood pressure readings.  Kathleen Pacheco has a current medication list which includes the following prescription(s): albuterol, beano, amlodipine, aspirin ec, atorvastatin, b-complex with vitamin c, beclomethasone, cetirizine, fluticasone, hydrochlorothiazide, hydroxyzine, montelukast, multivitamin gummies adult, potassium chloride sa, tramadol, trazodone, and turmeric. Also is allergic to ace inhibitors; other; and penicillins.  Kathleen Pacheco  has a past medical history of Bradycardia, Gout, bladder infections, Hyperlipidemia, Hypertension, Migraine, Thyroid disease, and Vertigo. Also  has a past surgical history that includes Abdominal hysterectomy;  Parathyroidectomy; Gallbladder surgery; and Cholecystectomy.   Objective:   Vitals: BP 110/72   Pulse 71   Temp 98.4 F (36.9 C) (Oral)   Resp 18   Ht 5' 4.25" (1.632 m)   Wt 200 lb 3.2 oz (90.8 kg)   SpO2 97%   BMI 34.10 kg/m   Physical Exam  Constitutional: She is oriented to person, place, and time. She appears well-developed and well-nourished. No distress.  HENT:  Head: Normocephalic and atraumatic.  Mouth/Throat: Uvula is midline, oropharynx is clear and moist and mucous membranes are normal.  Eyes: Pupils are equal, round, and reactive to light. Conjunctivae and EOM are normal.  Neck: Normal range of motion.  Cardiovascular: Normal rate, regular rhythm, normal heart sounds and intact distal pulses.  Pulmonary/Chest: Effort normal and breath sounds normal. She has no wheezes. She has no rhonchi. She has no rales.  Abdominal: Soft. Normal appearance. There is no tenderness. There is no CVA tenderness.  Musculoskeletal:       Right lower leg: She exhibits no swelling.       Left lower leg: She exhibits no swelling.  Neurological: She is alert and oriented to person, place, and time.  Skin: Skin is warm and dry.  Psychiatric: She has a normal mood and affect.  Vitals reviewed.   Results for orders placed or performed in visit on 10/02/17 (from the past 24 hour(s))  POCT urinalysis dipstick     Status: Abnormal   Collection Time: 10/02/17  5:05 PM  Result Value Ref Range   Color, UA yellow yellow   Clarity, UA clear clear   Glucose, UA negative negative mg/dL   Bilirubin, UA negative negative   Ketones, POC UA negative negative mg/dL   Spec Grav, UA 1.020  1.010 - 1.025   Blood, UA negative negative   pH, UA 7.0 5.0 - 8.0   Protein Ur, POC negative negative mg/dL   Urobilinogen, UA 0.2 0.2 or 1.0 E.U./dL   Nitrite, UA Negative Negative   Leukocytes, UA Trace (A) Negative  POCT glucose (manual entry)     Status: None   Collection Time: 10/02/17  5:06 PM  Result  Value Ref Range   POC Glucose 98 70 - 99 mg/dl  POCT Microscopic Urinalysis ([facility])     Status: Abnormal   Collection Time: 10/02/17  5:32 PM  Result Value Ref Range   WBC,UR,HPF,POC Few (A) None WBC/hpf   RBC,UR,HPF,POC None None RBC/hpf   Bacteria Few (A) None, Too numerous to count   Mucus Absent Absent   Epithelial Cells, UR Per Microscopy Few (A) None, Too numerous to count cells/hpf    Assessment and Plan :  1. Urinary frequency UA dip with trace leukocytes and urine micro with few white blood cells.  Point-of-care glucose normal.  Urine culture pending.  Will treat empirically for UTI at this time.  Given strict return precautions. - POCT urinalysis dipstick - POCT Microscopic Urinalysis ([facility]) - POCT glucose (manual entry) - cephALEXin (KEFLEX) 500 MG capsule; Take 1 capsule (500 mg total) by mouth 2 (two) times daily for 7 days.  Dispense: 14 capsule; Refill: 0  2. Leukocytes in urine - Urine Culture  3. Insomnia, unspecified type Refills provided. - traZODone (DESYREL) 50 MG tablet; Take 1 tablet (50 mg total) by mouth at bedtime.  Dispense: 90 tablet; Refill: 1  4. Mild intermittent asthma without complication Lungs CTA B.  Refills provided, although after reviewing her current albuterol inhaler, it does not appear that she had the canister in the holder appropriately and therefore thought it was empty.  Recommend using Qvar twice daily for better control.  Start daily Zyrtec.  Follow-up as needed. - albuterol (PROVENTIL HFA;VENTOLIN HFA) 108 (90 Base) MCG/ACT inhaler; Inhale 2 puffs into the lungs every 4 (four) hours as needed for wheezing or shortness of breath (cough, shortness of breath or wheezing.).  Dispense: 1 Inhaler; Refill: 1  5. Antibiotic-induced yeast infection - fluconazole (DIFLUCAN) 150 MG tablet; Take 1 tablet (150 mg total) by mouth once for 1 dose.  Dispense: 1 tablet; Refill: 0  Tenna Delaine, PA-C  [facility] 10/02/2017 5:35 PM

## 2017-10-02 ENCOUNTER — Other Ambulatory Visit: Payer: Self-pay

## 2017-10-02 ENCOUNTER — Ambulatory Visit (INDEPENDENT_AMBULATORY_CARE_PROVIDER_SITE_OTHER): Payer: Medicare Other | Admitting: Physician Assistant

## 2017-10-02 ENCOUNTER — Encounter: Payer: Self-pay | Admitting: Physician Assistant

## 2017-10-02 VITALS — BP 110/72 | HR 71 | Temp 98.4°F | Resp 18 | Ht 64.25 in | Wt 200.2 lb

## 2017-10-02 DIAGNOSIS — J4520 Mild intermittent asthma, uncomplicated: Secondary | ICD-10-CM

## 2017-10-02 DIAGNOSIS — R82998 Other abnormal findings in urine: Secondary | ICD-10-CM | POA: Diagnosis not present

## 2017-10-02 DIAGNOSIS — G4700 Insomnia, unspecified: Secondary | ICD-10-CM

## 2017-10-02 DIAGNOSIS — R350 Frequency of micturition: Secondary | ICD-10-CM

## 2017-10-02 DIAGNOSIS — T3695XA Adverse effect of unspecified systemic antibiotic, initial encounter: Secondary | ICD-10-CM | POA: Diagnosis not present

## 2017-10-02 DIAGNOSIS — B379 Candidiasis, unspecified: Secondary | ICD-10-CM

## 2017-10-02 LAB — POCT URINALYSIS DIP (MANUAL ENTRY)
Bilirubin, UA: NEGATIVE
GLUCOSE UA: NEGATIVE mg/dL
Ketones, POC UA: NEGATIVE mg/dL
Nitrite, UA: NEGATIVE
Protein Ur, POC: NEGATIVE mg/dL
RBC UA: NEGATIVE
SPEC GRAV UA: 1.02 (ref 1.010–1.025)
UROBILINOGEN UA: 0.2 U/dL
pH, UA: 7 (ref 5.0–8.0)

## 2017-10-02 LAB — POC MICROSCOPIC URINALYSIS (UMFC): MUCUS RE: ABSENT

## 2017-10-02 LAB — GLUCOSE, POCT (MANUAL RESULT ENTRY): POC GLUCOSE: 98 mg/dL (ref 70–99)

## 2017-10-02 MED ORDER — SULFAMETHOXAZOLE-TRIMETHOPRIM 800-160 MG PO TABS: 1.0000 | ORAL_TABLET | Freq: Two times a day (BID) | ORAL | 0 refills | Status: DC

## 2017-10-02 MED ORDER — CEPHALEXIN 500 MG PO CAPS: 500.0000 mg | ORAL_CAPSULE | Freq: Two times a day (BID) | ORAL | 0 refills | Status: AC

## 2017-10-02 MED ORDER — TRAZODONE HCL 50 MG PO TABS: 50.0000 mg | ORAL_TABLET | Freq: Every day | ORAL | 1 refills | Status: DC

## 2017-10-02 MED ORDER — FLUCONAZOLE 150 MG PO TABS: 150.0000 mg | ORAL_TABLET | Freq: Once | ORAL | 0 refills | Status: AC

## 2017-10-02 MED ORDER — ALBUTEROL SULFATE HFA 108 (90 BASE) MCG/ACT IN AERS: 2.0000 | INHALATION_SPRAY | RESPIRATORY_TRACT | 1 refills | Status: DC | PRN

## 2017-10-02 NOTE — Patient Instructions (Addendum)
Your results indicate you have a UTI. I have given you a prescription for an antibiotic. Please take with food. I have sent off a urine culture and we should have those results in 48 hours. If your symptoms worsen while you are awaiting these results or you develop fever, chills, flank pian, nausea and vomiting, please seek care immediately.    For asthma, start Qvar twice daily.  Use albuterol only as needed.  Also start taking Zyrtec daily.  For sleep, use trazodone as prescribed.     If you have lab work done today you will be contacted with your lab results within the next 2 weeks.  If you have not heard from [place] then please contact us. The fastest way to get your results is to register for My Chart.   IF you received an x-ray today, you will receive an invoice from [facility]. Please contact [facility] at [phone] with questions or concerns regarding your invoice.   IF you received labwork today, you will receive an invoice from [facility]. Please contact [facility] at [phone] with questions or concerns regarding your invoice.   Our billing staff will not be able to assist you with questions regarding bills from these companies.  You will be contacted with the lab results as soon as they are available. The fastest way to get your results is to activate your My Chart account. Instructions are located on the last page of this paperwork. If you have not heard from [place] regarding the results in 2 weeks, please contact this office.

## 2017-10-03 DIAGNOSIS — Z23 Encounter for immunization: Secondary | ICD-10-CM | POA: Diagnosis not present

## 2017-10-04 LAB — URINE CULTURE

## 2017-10-13 DIAGNOSIS — M9902 Segmental and somatic dysfunction of thoracic region: Secondary | ICD-10-CM | POA: Diagnosis not present

## 2017-10-13 DIAGNOSIS — M9903 Segmental and somatic dysfunction of lumbar region: Secondary | ICD-10-CM | POA: Diagnosis not present

## 2017-10-13 DIAGNOSIS — M5030 Other cervical disc degeneration, unspecified cervical region: Secondary | ICD-10-CM | POA: Diagnosis not present

## 2017-10-13 DIAGNOSIS — M9901 Segmental and somatic dysfunction of cervical region: Secondary | ICD-10-CM | POA: Diagnosis not present

## 2017-10-21 DIAGNOSIS — M9903 Segmental and somatic dysfunction of lumbar region: Secondary | ICD-10-CM | POA: Diagnosis not present

## 2017-10-21 DIAGNOSIS — M5030 Other cervical disc degeneration, unspecified cervical region: Secondary | ICD-10-CM | POA: Diagnosis not present

## 2017-10-21 DIAGNOSIS — M9901 Segmental and somatic dysfunction of cervical region: Secondary | ICD-10-CM | POA: Diagnosis not present

## 2017-10-21 DIAGNOSIS — M9902 Segmental and somatic dysfunction of thoracic region: Secondary | ICD-10-CM | POA: Diagnosis not present

## 2017-10-27 DIAGNOSIS — R7303 Prediabetes: Secondary | ICD-10-CM | POA: Diagnosis not present

## 2017-10-27 DIAGNOSIS — I10 Essential (primary) hypertension: Secondary | ICD-10-CM | POA: Diagnosis not present

## 2017-10-27 DIAGNOSIS — E785 Hyperlipidemia, unspecified: Secondary | ICD-10-CM | POA: Diagnosis not present

## 2017-10-29 DIAGNOSIS — R7303 Prediabetes: Secondary | ICD-10-CM | POA: Diagnosis not present

## 2017-10-29 DIAGNOSIS — I10 Essential (primary) hypertension: Secondary | ICD-10-CM | POA: Diagnosis not present

## 2017-10-29 DIAGNOSIS — I361 Nonrheumatic tricuspid (valve) insufficiency: Secondary | ICD-10-CM | POA: Diagnosis not present

## 2017-10-29 DIAGNOSIS — M1990 Unspecified osteoarthritis, unspecified site: Secondary | ICD-10-CM | POA: Diagnosis not present

## 2017-10-29 DIAGNOSIS — E785 Hyperlipidemia, unspecified: Secondary | ICD-10-CM | POA: Diagnosis not present

## 2017-10-29 DIAGNOSIS — R0609 Other forms of dyspnea: Secondary | ICD-10-CM | POA: Diagnosis not present

## 2017-10-29 DIAGNOSIS — I25118 Atherosclerotic heart disease of native coronary artery with other forms of angina pectoris: Secondary | ICD-10-CM | POA: Diagnosis not present

## 2017-11-11 DIAGNOSIS — M9903 Segmental and somatic dysfunction of lumbar region: Secondary | ICD-10-CM | POA: Diagnosis not present

## 2017-11-11 DIAGNOSIS — M9905 Segmental and somatic dysfunction of pelvic region: Secondary | ICD-10-CM | POA: Diagnosis not present

## 2017-11-11 DIAGNOSIS — M9902 Segmental and somatic dysfunction of thoracic region: Secondary | ICD-10-CM | POA: Diagnosis not present

## 2017-11-11 DIAGNOSIS — M791 Myalgia, unspecified site: Secondary | ICD-10-CM | POA: Diagnosis not present

## 2017-11-26 ENCOUNTER — Ambulatory Visit (INDEPENDENT_AMBULATORY_CARE_PROVIDER_SITE_OTHER): Payer: Medicare Other | Admitting: Physician Assistant

## 2017-11-26 ENCOUNTER — Other Ambulatory Visit: Payer: Self-pay

## 2017-11-26 ENCOUNTER — Encounter: Payer: Self-pay | Admitting: Physician Assistant

## 2017-11-26 VITALS — BP 147/84 | HR 72 | Temp 97.8°F | Resp 18 | Ht 62.8 in | Wt 205.6 lb

## 2017-11-26 DIAGNOSIS — J4520 Mild intermittent asthma, uncomplicated: Secondary | ICD-10-CM | POA: Diagnosis not present

## 2017-11-26 DIAGNOSIS — J4521 Mild intermittent asthma with (acute) exacerbation: Secondary | ICD-10-CM | POA: Diagnosis not present

## 2017-11-26 DIAGNOSIS — J309 Allergic rhinitis, unspecified: Secondary | ICD-10-CM

## 2017-11-26 MED ORDER — IPRATROPIUM BROMIDE 0.02 % IN SOLN
0.5000 mg | Freq: Once | RESPIRATORY_TRACT | Status: AC
  Administered 2017-11-26: 0.5 mg via RESPIRATORY_TRACT

## 2017-11-26 MED ORDER — MONTELUKAST SODIUM 10 MG PO TABS: ORAL_TABLET | ORAL | 0 refills | Status: DC

## 2017-11-26 MED ORDER — ALBUTEROL SULFATE (2.5 MG/3ML) 0.083% IN NEBU
2.5000 mg | INHALATION_SOLUTION | Freq: Once | RESPIRATORY_TRACT | Status: AC
  Administered 2017-11-26: 2.5 mg via RESPIRATORY_TRACT

## 2017-11-26 MED ORDER — FLUTICASONE PROPIONATE 50 MCG/ACT NA SUSP: 2.0000 | Freq: Every day | NASAL | 6 refills | Status: DC

## 2017-11-26 MED ORDER — IPRATROPIUM BROMIDE HFA 17 MCG/ACT IN AERS: 2.0000 | INHALATION_SPRAY | RESPIRATORY_TRACT | 12 refills | Status: DC | PRN

## 2017-11-26 MED ORDER — PREDNISONE 20 MG PO TABS: 40.0000 mg | ORAL_TABLET | Freq: Every day | ORAL | 0 refills | Status: AC

## 2017-11-26 MED ORDER — BECLOMETHASONE DIPROP HFA 80 MCG/ACT IN AERB: INHALATION_SPRAY | RESPIRATORY_TRACT | 2 refills | Status: DC

## 2017-11-26 MED ORDER — CETIRIZINE HCL 10 MG PO TABS: 10.0000 mg | ORAL_TABLET | Freq: Every day | ORAL | 3 refills | Status: AC | PRN

## 2017-11-26 NOTE — Progress Notes (Signed)
Kathleen Pacheco  MRN: [id number] DOB: [birth year]  Subjective:  Kathleen Pacheco is a 82 y.o. female seen in office today for a chief complaint cough and intermittent wheezing x 2 weeks. Associated dry cough. Denies fever, productive cough, chest pain, sinus pain, sore throat, shortness of breath, DOE, orthopnea, myalgia,night sweats, chills, fatigue, malaise, nausea, vomiting, abdominal pain and diarrhea. Hasnothad sick contact with anyone. Denies smoking.Has PMH of asthma and seasonal allergies. Asthma exacerbated by changes in seasons. She is taking QVAR 40 mcg/act BID and using albuterol inhaler prn. Has had to use albuterol at least twice daily the past couple of weeks. She is not taking allergy medication daily. Has nasal congestion, itchy watery eyes, and sneezing. Tried singulair in the past but was scared it would cause joint pain so stopped it.    Review of Systems  Per HPI  Patient Active Problem List   Diagnosis Date Noted  . Acute bronchitis 03/18/2016  . Osteoarthrosis, hand 10/22/2013  . DIZZINESS 02/02/2009  . HYPERCHOLESTEROLEMIA 01/25/2009  . HYPERTENSION 01/25/2009    Current Outpatient Medications on File Prior to Visit  Medication Sig Dispense Refill  . albuterol (PROVENTIL HFA;VENTOLIN HFA) 108 (90 Base) MCG/ACT inhaler Inhale 2 puffs into the lungs every 4 (four) hours as needed for wheezing or shortness of breath (cough, shortness of breath or wheezing.). 1 Inhaler 1  . Alpha-D-Galactosidase (BEANO) TABS Take 2 tablets by mouth every 6 (six) hours as needed (for gas).    Marland Kitchen amLODipine (NORVASC) 5 MG tablet Take 5 mg by mouth daily.    Marland Kitchen aspirin EC 81 MG tablet Take 81 mg by mouth daily.    Marland Kitchen atorvastatin (LIPITOR) 40 MG tablet Take 40 mg by mouth daily.    . B Complex-C (B-COMPLEX WITH VITAMIN C) tablet Take 1 tablet by mouth daily.    . hydrochlorothiazide (HYDRODIURIL) 25 MG tablet Take 25 mg by mouth daily.    . hydrOXYzine (ATARAX/VISTARIL) 50 MG tablet TAKE  2 TABLETS (100 MG TOTAL) BY MOUTH AT BEDTIME AS NEEDED (INSOMNIA). 30 tablet 0  . Multiple Vitamins-Minerals (MULTIVITAMIN GUMMIES ADULT) CHEW Chew 3 each by mouth daily.    . potassium chloride SA (K-DUR,KLOR-CON) 20 MEQ tablet Take 20 mEq by mouth daily.    . traMADol (ULTRAM) 50 MG tablet Take 50 mg by mouth every 6 (six) hours as needed for moderate pain.    . traZODone (DESYREL) 50 MG tablet Take 1 tablet (50 mg total) by mouth at bedtime. 90 tablet 1  . TURMERIC PO Take 5 mLs by mouth daily.     No current facility-administered medications on file prior to visit.     Allergies  Allergen Reactions  . Ace Inhibitors Other (See Comments)    Reaction:  Unknown   . Other Swelling and Other (See Comments)    Pt states that she is allergic to artificial sweeteners.   Reaction:  Facial swelling   . Penicillins Other (See Comments)    Reaction:  Yeast infection  Has patient had a PCN reaction causing immediate rash, facial/tongue/throat swelling, SOB or lightheadedness with hypotension: No Has patient had a PCN reaction causing severe rash involving mucus membranes or skin necrosis: No Has patient had a PCN reaction that required hospitalization No Has patient had a PCN reaction occurring within the last 10 years: No If all of the above answers are "NO", then may proceed with Cephalosporin use.     Objective:  BP (!) 147/84   Pulse  72   Temp 97.8 F (36.6 C) (Oral)   Resp 18   Ht 5' 2.8" (1.595 m)   Wt 205 lb 9.6 oz (93.3 kg)   SpO2 96%   BMI 36.66 kg/m   Physical Exam  Constitutional: She is oriented to person, place, and time. She appears well-developed and well-nourished. No distress.  HENT:  Head: Normocephalic and atraumatic.  Right Ear: Tympanic membrane, external ear and ear canal normal.  Left Ear: Tympanic membrane, external ear and ear canal normal.  Nose: Mucosal edema present. Right sinus exhibits no maxillary sinus tenderness and no frontal sinus tenderness. Left  sinus exhibits no maxillary sinus tenderness and no frontal sinus tenderness.  Mouth/Throat: Uvula is midline and mucous membranes are normal. Posterior oropharyngeal erythema (cobblestonging noted) present. No posterior oropharyngeal edema or tonsillar abscesses. No tonsillar exudate.  Eyes: Conjunctivae are normal.  Neck: Normal range of motion.  Cardiovascular: Normal rate, regular rhythm, normal heart sounds and intact distal pulses.  Pulmonary/Chest: Effort normal. No accessory muscle usage. No tachypnea. No respiratory distress. She has no decreased breath sounds. She has wheezes (diffuse wheezes noted in bilateral lung fields). She has no rhonchi. She has no rales.  Lymphadenopathy:       Head (right side): No submental, no submandibular, no tonsillar, no preauricular, no posterior auricular and no occipital adenopathy present.       Head (left side): No submental, no submandibular, no tonsillar, no preauricular, no posterior auricular and no occipital adenopathy present.    She has no cervical adenopathy.       Right: No supraclavicular adenopathy present.       Left: No supraclavicular adenopathy present.  Neurological: She is alert and oriented to person, place, and time.  Skin: Skin is warm and dry.  Psychiatric: She has a normal mood and affect.  Vitals reviewed.   Breathing treatment of duoneb given in office. Patient reports significant improvement. One scant wheeze auscultated on exam.  Peak flow increased from 160 (~74% of age predicted)  pre breathing treatment to 210 (~97% of age predicted) post breathing treatment. SpO2 increased from 96% pre breathing treatment to 100% post breathing treatment.   Assessment and Plan :  1. Mild intermittent asthma with acute exacerbation Pt is overall well-appearing, no acute distress.  Vitals stable. Wheezing sig improved. SpO2 increased to 100%.   Peak flow increased to 97% of predicted value.  Patient reports feeling much better. Likely  due to recent change in season. Admits good compliance with inhalers, but would suggest step up therapy at this time esp w/ current changes in weather. Rec short course prednisone and albuterol/atrovent inhaler every 4-6 hrs prn. Start zyrtec and flonase daily. Can consider starting singular again although she did contribute joint pain to this medication a few monhts ago. Do not rec abx therapy at this time as cough is nonproductive and pt is afebrile. No clinical signs of pneumonia.  Will have close follow-up in 48 hours.  Given strict return/ED precautions. - albuterol (PROVENTIL) (2.5 MG/3ML) 0.083% nebulizer solution 2.5 mg - ipratropium (ATROVENT) nebulizer solution 0.5 mg - beclomethasone (QVAR REDIHALER) 80 MCG/ACT inhaler; Inhale 2 puffs in the morning and 1 puff in the evening.  Dispense: 10.6 g; Refill: 2 - fluticasone (FLONASE) 50 MCG/ACT nasal spray; Place 2 sprays into both nostrils daily.  Dispense: 16 g; Refill: 6 - ipratropium (ATROVENT HFA) 17 MCG/ACT inhaler; Inhale 2 puffs into the lungs every 4 (four) hours as needed for wheezing.  Dispense:  1 Inhaler; Refill: 12 - predniSONE (DELTASONE) 20 MG tablet; Take 2 tablets (40 mg total) by mouth daily with breakfast for 5 days.  Dispense: 10 tablet; Refill: 0  2. Allergic rhinitis, unspecified seasonality, unspecified trigger - cetirizine (ZYRTEC) 10 MG tablet; Take 1 tablet (10 mg total) by mouth daily as needed for allergies.  Dispense: 90 tablet; Refill: 3 - montelukast (SINGULAIR) 10 MG tablet; TAKE 1 TABLET BY MOUTH EVERYDAY AT BEDTIME  Dispense: 90 tablet; Refill: 0    Tenna Delaine PA-C  [facility] at [facility] 11/26/2017 5:09 PM

## 2017-11-26 NOTE — Patient Instructions (Addendum)
We are going to treat your underlying inflammation with oral prednisone. Prednisone is a steroid and can cause side effects such as headache, irritability, nausea, vomiting, increased heart rate, increased blood pressure, increased blood sugar, appetite changes, and insomnia. Please take tablets in the morning with a full meal to help decrease the chances of these side effects.    Use albuterol inhaler and atrovent inhaler every 4-6 hours as needed for wheezing.   For your current QVAR inhaler, do 3 puffs twice daily.  (the new QVAR at your pharmacy will be dose 2 puffs in the morning and 1 in the evening).  Start zyrtec and flonase daily.  Return in 2 days for reevaluation.   Thank you for letting me participate in your health and well being.     If you have lab work done today you will be contacted with your lab results within the next 2 weeks.  If you have not heard from [place] then please contact us. The fastest way to get your results is to register for My Chart.  Asthma, Adult Asthma is a condition of the lungs in which the airways tighten and narrow. Asthma can make it hard to breathe. Asthma cannot be cured, but medicine and lifestyle changes can help control it. Asthma may be started (triggered) by:  Animal skin flakes (dander).  Dust.  Cockroaches.  Pollen.  Mold.  Smoke.  Cleaning products.  Hair sprays or aerosol sprays.  Paint fumes or strong smells.  Cold air, weather changes, and winds.  Crying or laughing hard.  Stress.  Certain medicines or drugs.  Foods, such as dried fruit, potato chips, and sparkling grape juice.  Infections or conditions (colds, flu).  Exercise.  Certain medical conditions or diseases.  Exercise or tiring activities.  Follow these instructions at home:  Take medicine as told by your doctor.  Use a peak flow meter as told by your doctor. A peak flow meter is a tool that measures how well the lungs are working.  Record  and keep track of the peak flow meter's readings.  Understand and use the asthma action plan. An asthma action plan is a written plan for taking care of your asthma and treating your attacks.  To help prevent asthma attacks: ? Do not smoke. Stay away from secondhand smoke. ? Change your heating and air conditioning filter often. ? Limit your use of fireplaces and wood stoves. ? Get rid of pests (such as roaches and mice) and their droppings. ? Throw away plants if you see mold on them. ? Clean your floors. Dust regularly. Use cleaning products that do not smell. ? Have someone vacuum when you are not home. Use a vacuum cleaner with a HEPA filter if possible. ? Replace carpet with wood, tile, or vinyl flooring. Carpet can trap animal skin flakes and dust. ? Use allergy-proof pillows, mattress covers, and box spring covers. ? Wash bed sheets and blankets every week in hot water and dry them in a dryer. ? Use blankets that are made of polyester or cotton. ? Clean bathrooms and kitchens with bleach. If possible, have someone repaint the walls in these rooms with mold-resistant paint. Keep out of the rooms that are being cleaned and painted. ? Wash hands often. Contact a doctor if:  You have make a whistling sound when breaking (wheeze), have shortness of breath, or have a cough even if taking medicine to prevent attacks.  The colored mucus you cough up (sputum) is thicker than  usual.  The colored mucus you cough up changes from clear or white to yellow, green, gray, or bloody.  You have problems from the medicine you are taking such as: ? A rash. ? Itching. ? Swelling. ? Trouble breathing.  You need reliever medicines more than 2-3 times a week.  Your peak flow measurement is still at 50-79% of your personal best after following the action plan for 1 hour.  You have a fever. Get help right away if:  You seem to be worse and are not responding to medicine during an asthma  attack.  You are short of breath even at rest.  You get short of breath when doing very little activity.  You have trouble eating, drinking, or talking.  You have chest pain.  You have a fast heartbeat.  Your lips or fingernails start to turn blue.  You are light-headed, dizzy, or faint.  Your peak flow is less than 50% of your personal best. This information is not intended to replace advice given to you by your health care provider. Make sure you discuss any questions you have with your health care provider. Document Released: 06/19/2007 Document Revised: 06/08/2015 Document Reviewed: 07/30/2012 Elsevier Interactive Patient Education  2017 [employer].   IF you received an x-ray today, you will receive an invoice from [facility]. Please contact [facility] at [phone] with questions or concerns regarding your invoice.   IF you received labwork today, you will receive an invoice from [facility]. Please contact [facility] at [phone] with questions or concerns regarding your invoice.   Our billing staff will not be able to assist you with questions regarding bills from these companies.  You will be contacted with the lab results as soon as they are available. The fastest way to get your results is to activate your My Chart account. Instructions are located on the last page of this paperwork. If you have not heard from [place] regarding the results in 2 weeks, please contact this office.

## 2017-11-27 ENCOUNTER — Encounter: Payer: Self-pay | Admitting: Physician Assistant

## 2017-11-28 ENCOUNTER — Encounter: Payer: Self-pay | Admitting: Physician Assistant

## 2017-11-28 ENCOUNTER — Ambulatory Visit (INDEPENDENT_AMBULATORY_CARE_PROVIDER_SITE_OTHER): Payer: Medicare Other | Admitting: Physician Assistant

## 2017-11-28 ENCOUNTER — Other Ambulatory Visit: Payer: Self-pay

## 2017-11-28 VITALS — BP 148/86 | HR 67 | Temp 97.8°F | Resp 17 | Ht 62.8 in | Wt 205.6 lb

## 2017-11-28 DIAGNOSIS — J4521 Mild intermittent asthma with (acute) exacerbation: Secondary | ICD-10-CM

## 2017-11-28 DIAGNOSIS — R82998 Other abnormal findings in urine: Secondary | ICD-10-CM

## 2017-11-28 DIAGNOSIS — R350 Frequency of micturition: Secondary | ICD-10-CM

## 2017-11-28 DIAGNOSIS — R300 Dysuria: Secondary | ICD-10-CM | POA: Diagnosis not present

## 2017-11-28 LAB — POCT URINALYSIS DIP (MANUAL ENTRY)
Bilirubin, UA: NEGATIVE
Glucose, UA: NEGATIVE mg/dL
Ketones, POC UA: NEGATIVE mg/dL
Nitrite, UA: NEGATIVE
PROTEIN UA: NEGATIVE mg/dL
RBC UA: NEGATIVE
UROBILINOGEN UA: 0.2 U/dL
pH, UA: 5.5 (ref 5.0–8.0)

## 2017-11-28 LAB — POC MICROSCOPIC URINALYSIS (UMFC): MUCUS RE: ABSENT

## 2017-11-28 MED ORDER — CEPHALEXIN 500 MG PO CAPS: 500.0000 mg | ORAL_CAPSULE | Freq: Two times a day (BID) | ORAL | 0 refills | Status: AC

## 2017-11-28 NOTE — Progress Notes (Signed)
MRN: [id number] DOB: 08/30/1935  Subjective:   Kathleen Pacheco is a 82 y.o. female presenting for chief complaint of 2 day f/u per Kathleen Pacheco (mild intermittent asthma acute exacerbation) . Today, reports she is feeling much better.  Wheezing has resolved.  Still has intermittent dry cough.  Nasal congestion improved.  She is taking prednisone as prescribed, Flonase, Zyrtec, and albuterol as needed.  She is still taking Qvar 40 mcg inhaler, did not pick up the stronger strength inhaler due to cost.  She can afford it but she wanted to check with me to see if she actually needed it.  Denies fever, chills, chest pain, shortness of breath, lower leg swelling, nausea, vomiting, diarrhea.  Is concerned that she has a new UTI as she is having similar symptoms to when she had a UTI last time.  She has noticed urinary freqncy x few weeks. Denies dysuria, urgency, hematuria, flank pain, n/v/d. Denies stress and urge incontinence.  Has not tried anything for relief.  She is not currently sexually active.  Nicloe has a current medication list which includes the following prescription(s): albuterol, beano, amlodipine, aspirin ec, atorvastatin, b-complex with vitamin c, beclomethasone, cetirizine, fluticasone, hydrochlorothiazide, hydroxyzine, ipratropium, montelukast, multivitamin gummies adult, potassium chloride sa, prednisone, tramadol, trazodone, turmeric, and cephalexin. Also is allergic to ace inhibitors; other; and penicillins.  Kathleen Pacheco  has a past medical history of Bradycardia, Gout, bladder infections, Hyperlipidemia, Hypertension, Migraine, Thyroid disease, and Vertigo. Also  has a past surgical history that includes Abdominal hysterectomy; Parathyroidectomy; Gallbladder surgery; and Cholecystectomy.   Objective:   Vitals: BP (!) 148/86 (BP Location: Right Arm, Patient Position: Sitting, Cuff Size: Large)   Pulse 67   Temp 97.8 F (36.6 C) (Oral)   Resp 17   Ht 5' 2.8" (1.595 m)   Wt 205 lb  9.6 oz (93.3 kg)   SpO2 95%   BMI 36.65 kg/m   Physical Exam  Constitutional: She is oriented to person, place, and time. She appears well-developed and well-nourished. No distress.  HENT:  Head: Normocephalic and atraumatic.  Eyes: Conjunctivae are normal.  Neck: Normal range of motion.  Cardiovascular: Normal rate, regular rhythm and normal heart sounds.  Pulmonary/Chest: Effort normal and breath sounds normal. She has no wheezes. She has no rhonchi. She has no rales.  Neurological: She is alert and oriented to person, place, and time.  Skin: Skin is warm and dry.  Psychiatric: She has a normal mood and affect.  Vitals reviewed.   Results for orders placed or performed in visit on 11/28/17 (from the past 24 hour(s))  POCT urinalysis dipstick     Status: Abnormal   Collection Time: 11/28/17  4:50 PM  Result Value Ref Range   Color, UA yellow yellow   Clarity, UA clear clear   Glucose, UA negative negative mg/dL   Bilirubin, UA negative negative   Ketones, POC UA negative negative mg/dL   Spec Grav, UA <=1.005 (A) 1.010 - 1.025   Blood, UA negative negative   pH, UA 5.5 5.0 - 8.0   Protein Ur, POC negative negative mg/dL   Urobilinogen, UA 0.2 0.2 or 1.0 E.U./dL   Nitrite, UA Negative Negative   Leukocytes, UA Small (1+) (A) Negative  POCT Microscopic Urinalysis ([facility])     Status: None   Collection Time: 11/28/17  5:05 PM  Result Value Ref Range   WBC,UR,HPF,POC None None WBC/hpf   RBC,UR,HPF,POC None None RBC/hpf   Bacteria None None, Too numerous to  count   Mucus Absent Absent   Epithelial Cells, UR Per Microscopy None None, Too numerous to count cells/hpf    Assessment and Plan :  1. Mild intermittent asthma with acute exacerbation Improving.  Lungs CTAB today.  Recommend completing prednisone course as prescribed.  Do suggest picking up stronger strength Qvar for better control of asthma.  The patient agrees.  Continue with allergy medication daily.  Follow-up as  needed.  2. Urinary frequency UA with small leukocytes.  Patient is leaving for trip in the next few days.  Will treat empirically for UTI at this time and all urine culture pending.  If no growth on urine culture, can discontinue antibiotic. - POCT urinalysis dipstick - POCT Microscopic Urinalysis ([facility])  3. Leukocytes in urine - Urine Culture - cephALEXin (KEFLEX) 500 MG capsule; Take 1 capsule (500 mg total) by mouth 2 (two) times daily for 7 days.  Dispense: 14 capsule; Refill: 0   Kathleen Delaine, PA-C  [facility] 11/28/2017 5:21 PM

## 2017-11-28 NOTE — Patient Instructions (Addendum)
Your results indicate you have a UTI. I have given you a prescription for an antibiotic. Please take with food. I have sent off a urine culture and we should have those results in 48 hours. If your symptoms worsen while you are awaiting these results or you develop fever, chills, flank pian, nausea and vomiting, please seek care immediately.    Continue prednisone. Pick up QVAR 80 and take as prescribed. You can call your insurance and if they cover something more affordable, let me know before next Wednesday.   Follow up as needed.    If you have lab work done today you will be contacted with your lab results within the next 2 weeks.  If you have not heard from [place] then please contact us. The fastest way to get your results is to register for My Chart.   IF you received an x-ray today, you will receive an invoice from [facility]. Please contact [facility] at [phone] with questions or concerns regarding your invoice.   IF you received labwork today, you will receive an invoice from [facility]. Please contact [facility] at [phone] with questions or concerns regarding your invoice.   Our billing staff will not be able to assist you with questions regarding bills from these companies.  You will be contacted with the lab results as soon as they are available. The fastest way to get your results is to activate your My Chart account. Instructions are located on the last page of this paperwork. If you have not heard from [place] regarding the results in 2 weeks, please contact this office.

## 2017-11-29 ENCOUNTER — Encounter: Payer: Self-pay | Admitting: Physician Assistant

## 2017-11-29 LAB — URINE CULTURE

## 2017-12-03 ENCOUNTER — Encounter: Payer: Self-pay | Admitting: Physician Assistant

## 2017-12-03 NOTE — Progress Notes (Signed)
Lab letter has been sent via mail to patients home address 

## 2017-12-23 DIAGNOSIS — E785 Hyperlipidemia, unspecified: Secondary | ICD-10-CM | POA: Diagnosis not present

## 2017-12-23 DIAGNOSIS — R7303 Prediabetes: Secondary | ICD-10-CM | POA: Diagnosis not present

## 2017-12-23 DIAGNOSIS — I25118 Atherosclerotic heart disease of native coronary artery with other forms of angina pectoris: Secondary | ICD-10-CM | POA: Diagnosis not present

## 2017-12-23 DIAGNOSIS — I361 Nonrheumatic tricuspid (valve) insufficiency: Secondary | ICD-10-CM | POA: Diagnosis not present

## 2017-12-23 DIAGNOSIS — R0609 Other forms of dyspnea: Secondary | ICD-10-CM | POA: Diagnosis not present

## 2017-12-23 DIAGNOSIS — I10 Essential (primary) hypertension: Secondary | ICD-10-CM | POA: Diagnosis not present

## 2017-12-23 DIAGNOSIS — M1990 Unspecified osteoarthritis, unspecified site: Secondary | ICD-10-CM | POA: Diagnosis not present

## 2017-12-25 DIAGNOSIS — M9905 Segmental and somatic dysfunction of pelvic region: Secondary | ICD-10-CM | POA: Diagnosis not present

## 2017-12-25 DIAGNOSIS — M9903 Segmental and somatic dysfunction of lumbar region: Secondary | ICD-10-CM | POA: Diagnosis not present

## 2017-12-25 DIAGNOSIS — M9902 Segmental and somatic dysfunction of thoracic region: Secondary | ICD-10-CM | POA: Diagnosis not present

## 2017-12-25 DIAGNOSIS — M791 Myalgia, unspecified site: Secondary | ICD-10-CM | POA: Diagnosis not present

## 2017-12-26 DIAGNOSIS — M7062 Trochanteric bursitis, left hip: Secondary | ICD-10-CM | POA: Diagnosis not present

## 2017-12-26 DIAGNOSIS — M25552 Pain in left hip: Secondary | ICD-10-CM | POA: Diagnosis not present

## 2018-01-12 DIAGNOSIS — M9903 Segmental and somatic dysfunction of lumbar region: Secondary | ICD-10-CM | POA: Diagnosis not present

## 2018-01-12 DIAGNOSIS — M9902 Segmental and somatic dysfunction of thoracic region: Secondary | ICD-10-CM | POA: Diagnosis not present

## 2018-01-12 DIAGNOSIS — M9905 Segmental and somatic dysfunction of pelvic region: Secondary | ICD-10-CM | POA: Diagnosis not present

## 2018-01-12 DIAGNOSIS — M791 Myalgia, unspecified site: Secondary | ICD-10-CM | POA: Diagnosis not present

## 2018-02-12 DIAGNOSIS — I361 Nonrheumatic tricuspid (valve) insufficiency: Secondary | ICD-10-CM | POA: Diagnosis not present

## 2018-02-12 DIAGNOSIS — I10 Essential (primary) hypertension: Secondary | ICD-10-CM | POA: Diagnosis not present

## 2018-02-12 DIAGNOSIS — M1990 Unspecified osteoarthritis, unspecified site: Secondary | ICD-10-CM | POA: Diagnosis not present

## 2018-02-12 DIAGNOSIS — R7303 Prediabetes: Secondary | ICD-10-CM | POA: Diagnosis not present

## 2018-02-12 DIAGNOSIS — I25118 Atherosclerotic heart disease of native coronary artery with other forms of angina pectoris: Secondary | ICD-10-CM | POA: Diagnosis not present

## 2018-02-12 DIAGNOSIS — E785 Hyperlipidemia, unspecified: Secondary | ICD-10-CM | POA: Diagnosis not present

## 2018-02-26 DIAGNOSIS — M9902 Segmental and somatic dysfunction of thoracic region: Secondary | ICD-10-CM | POA: Diagnosis not present

## 2018-02-26 DIAGNOSIS — M9903 Segmental and somatic dysfunction of lumbar region: Secondary | ICD-10-CM | POA: Diagnosis not present

## 2018-02-26 DIAGNOSIS — M9905 Segmental and somatic dysfunction of pelvic region: Secondary | ICD-10-CM | POA: Diagnosis not present

## 2018-02-26 DIAGNOSIS — M791 Myalgia, unspecified site: Secondary | ICD-10-CM | POA: Diagnosis not present

## 2018-03-04 DIAGNOSIS — H838X3 Other specified diseases of inner ear, bilateral: Secondary | ICD-10-CM | POA: Diagnosis not present

## 2018-03-04 DIAGNOSIS — H6121 Impacted cerumen, right ear: Secondary | ICD-10-CM | POA: Diagnosis not present

## 2018-03-04 DIAGNOSIS — H903 Sensorineural hearing loss, bilateral: Secondary | ICD-10-CM | POA: Diagnosis not present

## 2018-03-27 ENCOUNTER — Other Ambulatory Visit: Payer: Self-pay | Admitting: Physician Assistant

## 2018-03-27 DIAGNOSIS — J4520 Mild intermittent asthma, uncomplicated: Secondary | ICD-10-CM

## 2018-03-27 NOTE — Telephone Encounter (Signed)
Requested Prescriptions  Pending Prescriptions Disp Refills  . albuterol (PROVENTIL HFA;VENTOLIN HFA) 108 (90 Base) MCG/ACT inhaler [Pharmacy Med Name: ALBUTEROL HFA (PROVENTIL) INH] 6.7 Inhaler 1    Sig: INHALE 2 PUFFS INTO THE LUNGS EVERY 4 HOURS AS NEEDED FOR WHEEZING, SHORTNESS OF BREATH, OR COUGH     Pulmonology:  Beta Agonists Failed - 03/27/2018  9:04 AM      Failed - One inhaler should last at least one month. If the patient is requesting refills earlier, contact the patient to check for uncontrolled symptoms.      Passed - Valid encounter within last 12 months    Recent Outpatient Visits          3 months ago Mild intermittent asthma with acute exacerbation   [facility] [place], [place] D, PA-C   4 months ago Mild intermittent asthma with acute exacerbation   [facility] [place], [place] D, PA-C   5 months ago Urinary frequency   [facility] [place], [place] D, PA-C   8 months ago Mild intermittent asthma with acute exacerbation   [facility] [place], [place] D, PA-C   9 months ago Mild intermittent asthma with acute exacerbation   [facility], Reather Laurence, PA-C      Future Appointments            In 1 month Forrest Moron, MD [facility] [place], [place] pt and made appt to establish care with Dr Nolon Rod

## 2018-04-21 ENCOUNTER — Telehealth: Payer: Self-pay | Admitting: *Deleted

## 2018-04-21 NOTE — Telephone Encounter (Signed)
Left detailed message to schedule  AWV 

## 2018-05-12 DIAGNOSIS — M25551 Pain in right hip: Secondary | ICD-10-CM | POA: Diagnosis not present

## 2018-05-12 DIAGNOSIS — M1611 Unilateral primary osteoarthritis, right hip: Secondary | ICD-10-CM | POA: Diagnosis not present

## 2018-05-13 ENCOUNTER — Other Ambulatory Visit: Payer: Self-pay | Admitting: Family Medicine

## 2018-05-13 DIAGNOSIS — M25551 Pain in right hip: Secondary | ICD-10-CM

## 2018-05-15 DIAGNOSIS — R7303 Prediabetes: Secondary | ICD-10-CM | POA: Diagnosis not present

## 2018-05-15 DIAGNOSIS — E785 Hyperlipidemia, unspecified: Secondary | ICD-10-CM | POA: Diagnosis not present

## 2018-05-15 DIAGNOSIS — I25118 Atherosclerotic heart disease of native coronary artery with other forms of angina pectoris: Secondary | ICD-10-CM | POA: Diagnosis not present

## 2018-05-15 DIAGNOSIS — I361 Nonrheumatic tricuspid (valve) insufficiency: Secondary | ICD-10-CM | POA: Diagnosis not present

## 2018-05-15 DIAGNOSIS — I10 Essential (primary) hypertension: Secondary | ICD-10-CM | POA: Diagnosis not present

## 2018-05-15 DIAGNOSIS — M1990 Unspecified osteoarthritis, unspecified site: Secondary | ICD-10-CM | POA: Diagnosis not present

## 2018-05-16 ENCOUNTER — Other Ambulatory Visit: Payer: Self-pay | Admitting: Physician Assistant

## 2018-05-16 DIAGNOSIS — J4520 Mild intermittent asthma, uncomplicated: Secondary | ICD-10-CM

## 2018-05-22 ENCOUNTER — Ambulatory Visit
Admission: RE | Admit: 2018-05-22 | Discharge: 2018-05-22 | Disposition: A | Discharge status: Home / Self Care | Payer: Medicare Other | Source: Ambulatory Visit | Attending: Family Medicine | Admitting: Family Medicine

## 2018-05-22 ENCOUNTER — Other Ambulatory Visit: Payer: Self-pay

## 2018-05-22 DIAGNOSIS — M25551 Pain in right hip: Secondary | ICD-10-CM

## 2018-05-25 ENCOUNTER — Ambulatory Visit: Payer: Self-pay | Admitting: Family Medicine

## 2018-06-03 DIAGNOSIS — M1990 Unspecified osteoarthritis, unspecified site: Secondary | ICD-10-CM | POA: Diagnosis not present

## 2018-06-03 DIAGNOSIS — Z7189 Other specified counseling: Secondary | ICD-10-CM | POA: Diagnosis not present

## 2018-06-03 DIAGNOSIS — R350 Frequency of micturition: Secondary | ICD-10-CM | POA: Diagnosis not present

## 2018-06-03 DIAGNOSIS — I10 Essential (primary) hypertension: Secondary | ICD-10-CM | POA: Diagnosis not present

## 2018-06-03 DIAGNOSIS — N390 Urinary tract infection, site not specified: Secondary | ICD-10-CM | POA: Diagnosis not present

## 2018-06-03 DIAGNOSIS — I38 Endocarditis, valve unspecified: Secondary | ICD-10-CM | POA: Diagnosis not present

## 2018-06-03 DIAGNOSIS — E7800 Pure hypercholesterolemia, unspecified: Secondary | ICD-10-CM | POA: Diagnosis not present

## 2018-06-03 DIAGNOSIS — J309 Allergic rhinitis, unspecified: Secondary | ICD-10-CM | POA: Diagnosis not present

## 2018-06-12 DIAGNOSIS — R7303 Prediabetes: Secondary | ICD-10-CM | POA: Diagnosis not present

## 2018-06-12 DIAGNOSIS — I10 Essential (primary) hypertension: Secondary | ICD-10-CM | POA: Diagnosis not present

## 2018-06-12 DIAGNOSIS — I361 Nonrheumatic tricuspid (valve) insufficiency: Secondary | ICD-10-CM | POA: Diagnosis not present

## 2018-06-12 DIAGNOSIS — E785 Hyperlipidemia, unspecified: Secondary | ICD-10-CM | POA: Diagnosis not present

## 2018-06-12 DIAGNOSIS — M1990 Unspecified osteoarthritis, unspecified site: Secondary | ICD-10-CM | POA: Diagnosis not present

## 2018-06-12 DIAGNOSIS — I25118 Atherosclerotic heart disease of native coronary artery with other forms of angina pectoris: Secondary | ICD-10-CM | POA: Diagnosis not present

## 2018-06-29 DIAGNOSIS — M791 Myalgia, unspecified site: Secondary | ICD-10-CM | POA: Diagnosis not present

## 2018-06-29 DIAGNOSIS — M9903 Segmental and somatic dysfunction of lumbar region: Secondary | ICD-10-CM | POA: Diagnosis not present

## 2018-06-29 DIAGNOSIS — M9902 Segmental and somatic dysfunction of thoracic region: Secondary | ICD-10-CM | POA: Diagnosis not present

## 2018-06-29 DIAGNOSIS — M9905 Segmental and somatic dysfunction of pelvic region: Secondary | ICD-10-CM | POA: Diagnosis not present

## 2018-07-15 DIAGNOSIS — M9903 Segmental and somatic dysfunction of lumbar region: Secondary | ICD-10-CM | POA: Diagnosis not present

## 2018-07-15 DIAGNOSIS — M9905 Segmental and somatic dysfunction of pelvic region: Secondary | ICD-10-CM | POA: Diagnosis not present

## 2018-07-15 DIAGNOSIS — M9902 Segmental and somatic dysfunction of thoracic region: Secondary | ICD-10-CM | POA: Diagnosis not present

## 2018-07-15 DIAGNOSIS — M791 Myalgia, unspecified site: Secondary | ICD-10-CM | POA: Diagnosis not present

## 2018-07-30 DIAGNOSIS — R946 Abnormal results of thyroid function studies: Secondary | ICD-10-CM | POA: Diagnosis not present

## 2018-07-30 DIAGNOSIS — E559 Vitamin D deficiency, unspecified: Secondary | ICD-10-CM | POA: Diagnosis not present

## 2018-07-30 DIAGNOSIS — R5383 Other fatigue: Secondary | ICD-10-CM | POA: Diagnosis not present

## 2018-07-30 DIAGNOSIS — E7800 Pure hypercholesterolemia, unspecified: Secondary | ICD-10-CM | POA: Diagnosis not present

## 2018-07-30 DIAGNOSIS — E538 Deficiency of other specified B group vitamins: Secondary | ICD-10-CM | POA: Diagnosis not present

## 2018-07-30 DIAGNOSIS — I10 Essential (primary) hypertension: Secondary | ICD-10-CM | POA: Diagnosis not present

## 2018-07-30 DIAGNOSIS — R7309 Other abnormal glucose: Secondary | ICD-10-CM | POA: Diagnosis not present

## 2018-08-03 DIAGNOSIS — Z961 Presence of intraocular lens: Secondary | ICD-10-CM | POA: Diagnosis not present

## 2018-08-03 DIAGNOSIS — H524 Presbyopia: Secondary | ICD-10-CM | POA: Diagnosis not present

## 2018-08-20 DIAGNOSIS — I361 Nonrheumatic tricuspid (valve) insufficiency: Secondary | ICD-10-CM | POA: Diagnosis not present

## 2018-08-20 DIAGNOSIS — R42 Dizziness and giddiness: Secondary | ICD-10-CM | POA: Diagnosis not present

## 2018-08-20 DIAGNOSIS — I25118 Atherosclerotic heart disease of native coronary artery with other forms of angina pectoris: Secondary | ICD-10-CM | POA: Diagnosis not present

## 2018-08-20 DIAGNOSIS — M1990 Unspecified osteoarthritis, unspecified site: Secondary | ICD-10-CM | POA: Diagnosis not present

## 2018-08-20 DIAGNOSIS — I10 Essential (primary) hypertension: Secondary | ICD-10-CM | POA: Diagnosis not present

## 2018-08-20 DIAGNOSIS — E785 Hyperlipidemia, unspecified: Secondary | ICD-10-CM | POA: Diagnosis not present

## 2018-08-20 DIAGNOSIS — R7303 Prediabetes: Secondary | ICD-10-CM | POA: Diagnosis not present

## 2018-08-31 ENCOUNTER — Telehealth: Payer: Self-pay | Admitting: *Deleted

## 2018-08-31 NOTE — Telephone Encounter (Signed)
SCHEDULE AWV 

## 2018-09-01 DIAGNOSIS — M791 Myalgia, unspecified site: Secondary | ICD-10-CM | POA: Diagnosis not present

## 2018-09-01 DIAGNOSIS — M9902 Segmental and somatic dysfunction of thoracic region: Secondary | ICD-10-CM | POA: Diagnosis not present

## 2018-09-01 DIAGNOSIS — M9905 Segmental and somatic dysfunction of pelvic region: Secondary | ICD-10-CM | POA: Diagnosis not present

## 2018-09-01 DIAGNOSIS — M9903 Segmental and somatic dysfunction of lumbar region: Secondary | ICD-10-CM | POA: Diagnosis not present

## 2018-09-08 ENCOUNTER — Ambulatory Visit: Payer: Medicare Other | Admitting: Family Medicine

## 2018-09-14 DIAGNOSIS — M7661 Achilles tendinitis, right leg: Secondary | ICD-10-CM | POA: Diagnosis not present

## 2018-09-14 DIAGNOSIS — M25552 Pain in left hip: Secondary | ICD-10-CM | POA: Diagnosis not present

## 2018-09-30 DIAGNOSIS — Z23 Encounter for immunization: Secondary | ICD-10-CM | POA: Diagnosis not present

## 2018-10-12 ENCOUNTER — Ambulatory Visit (INDEPENDENT_AMBULATORY_CARE_PROVIDER_SITE_OTHER): Payer: Medicare Other | Admitting: Family Medicine

## 2018-10-12 ENCOUNTER — Encounter: Payer: Self-pay | Admitting: Family Medicine

## 2018-10-12 ENCOUNTER — Other Ambulatory Visit: Payer: Self-pay

## 2018-10-12 VITALS — BP 131/81 | HR 75 | Temp 98.2°F | Resp 17 | Ht 62.8 in | Wt 210.2 lb

## 2018-10-12 DIAGNOSIS — R600 Localized edema: Secondary | ICD-10-CM

## 2018-10-12 DIAGNOSIS — E782 Mixed hyperlipidemia: Secondary | ICD-10-CM

## 2018-10-12 DIAGNOSIS — I10 Essential (primary) hypertension: Secondary | ICD-10-CM

## 2018-10-12 DIAGNOSIS — R739 Hyperglycemia, unspecified: Secondary | ICD-10-CM | POA: Diagnosis not present

## 2018-10-12 DIAGNOSIS — N3281 Overactive bladder: Secondary | ICD-10-CM

## 2018-10-12 DIAGNOSIS — I7090 Unspecified atherosclerosis: Secondary | ICD-10-CM | POA: Diagnosis not present

## 2018-10-12 DIAGNOSIS — L309 Dermatitis, unspecified: Secondary | ICD-10-CM | POA: Diagnosis not present

## 2018-10-12 DIAGNOSIS — Z23 Encounter for immunization: Secondary | ICD-10-CM

## 2018-10-12 LAB — POCT URINALYSIS DIP (MANUAL ENTRY)
Bilirubin, UA: NEGATIVE
Blood, UA: NEGATIVE
Glucose, UA: NEGATIVE mg/dL
Ketones, POC UA: NEGATIVE mg/dL
Nitrite, UA: NEGATIVE
Protein Ur, POC: NEGATIVE mg/dL
Spec Grav, UA: 1.01 (ref 1.010–1.025)
Urobilinogen, UA: 0.2 E.U./dL
pH, UA: 5 (ref 5.0–8.0)

## 2018-10-12 MED ORDER — TRIAMCINOLONE ACETONIDE 0.1 % EX CREA: 1.0000 "application " | TOPICAL_CREAM | Freq: Two times a day (BID) | CUTANEOUS | 0 refills | Status: DC

## 2018-10-12 NOTE — Progress Notes (Signed)
Established Patient Office Visit  Subjective:  Patient ID: Kathleen Pacheco, female    DOB: [birth year]  Age: 83 y.o. MRN: [id number]  CC:  Chief Complaint  Patient presents with  . Over Active Bladder    with frequency x all summer, rash on right arm x 1 yr-tried cortisone cream and it helps but continued itching.  Pt c/o sleep issues for long time and takes a tylenol pm at night for sleep, pt lives alone    HPI [employer] presents for   Pt reports that she is voiding her bladder every 1-2 hours She reports that she does not typically get incontinence of urine unless she is traveling long distances.  She reports that she goes to the bathroom and has a full bladder to empty She states that she has evaluated this before and was told it was not an infection Her OB gave her a prescription over a year ago   Hypertension: Patient here for follow-up of elevated blood pressure. She is not exercising and is adherent to low salt diet.  Blood pressure is well controlled at home. Cardiac symptoms none. Patient denies chest pressure/discomfort, claudication, exertional chest pressure/discomfort, irregular heart beat and near-syncope.  Cardiovascular risk factors: dyslipidemia, hypertension and sedentary lifestyle. Use of agents associated with hypertension: none. History of target organ damage: none. BP Readings from Last 3 Encounters:  10/12/18 131/81  11/28/17 (!) 148/86  11/26/17 (!) 147/84       Past Medical History:  Diagnosis Date  . Bradycardia   . Gout   . Hx of bladder infections   . Hyperlipidemia   . Hypertension   . Migraine   . Thyroid disease   . Vertigo     Past Surgical History:  Procedure Laterality Date  . ABDOMINAL HYSTERECTOMY    . CHOLECYSTECTOMY    . GALLBLADDER SURGERY    . PARATHYROIDECTOMY      No family history on file.  Social History   Socioeconomic History  . Marital status: Divorced    Spouse name: Not on file  . Number of children: 3   . Years of education: Not on file  . Highest education level: Not on file  Occupational History  . Not on file  Social Needs  . Financial resource strain: Not on file  . Food insecurity    Worry: Not on file    Inability: Not on file  . Transportation needs    Medical: Not on file    Non-medical: Not on file  Tobacco Use  . Smoking status: Never Smoker  . Smokeless tobacco: Never Used  Substance and Sexual Activity  . Alcohol use: Yes    Alcohol/week: 1.0 standard drinks    Types: 1 Glasses of wine per week  . Drug use: No  . Sexual activity: Not Currently    Birth control/protection: Surgical    Comment: HYST   Lifestyle  . Physical activity    Days per week: Not on file    Minutes per session: Not on file  . Stress: Not on file  Relationships  . Social Herbalist on phone: Not on file    Gets together: Not on file    Attends religious service: Not on file    Active member of club or organization: Not on file    Attends meetings of clubs or organizations: Not on file    Relationship status: Not on file  . Intimate partner violence  Fear of current or ex partner: Not on file    Emotionally abused: Not on file    Physically abused: Not on file    Forced sexual activity: Not on file  Other Topics Concern  . Not on file  Social History Narrative   Diet?      Do you drink/eat things with caffeine? Cup of macho- 1/week      Marital status?                divorced                    What year were you married? 1964      Do you live in a house, apartment, assisted living, condo, trailer, etc.? house      Is it one or more stories? one      How many persons live in your home? 1       Do you have any pets in your home? (please list) no      Current or past profession: Retired Pharmacist, hospital       Do you exercise? Work sometimes                                     Type & how often?      Do you have a living will? no      Do you have a DNR form?   no                                If not, do you want to discuss one? no      Do you have signed POA/HPOA for forms?     Outpatient Medications Prior to Visit  Medication Sig Dispense Refill  . albuterol (PROVENTIL HFA;VENTOLIN HFA) 108 (90 Base) MCG/ACT inhaler INHALE 2 PUFFS INTO THE LUNGS EVERY 4 HOURS AS NEEDED FOR WHEEZING, SHORTNESS OF BREATH, OR COUGH 6.7 Inhaler 1  . Alpha-D-Galactosidase (BEANO) TABS Take 2 tablets by mouth every 6 (six) hours as needed (for gas).    Marland Kitchen amLODipine (NORVASC) 5 MG tablet Take 5 mg by mouth daily.    Marland Kitchen aspirin EC 81 MG tablet Take 81 mg by mouth daily.    Marland Kitchen atorvastatin (LIPITOR) 40 MG tablet Take 40 mg by mouth daily.    . B Complex-C (B-COMPLEX WITH VITAMIN C) tablet Take 1 tablet by mouth daily.    . beclomethasone (QVAR REDIHALER) 80 MCG/ACT inhaler Inhale 2 puffs in the morning and 1 puff in the evening. 10.6 g 2  . cetirizine (ZYRTEC) 10 MG tablet Take 1 tablet (10 mg total) by mouth daily as needed for allergies. 90 tablet 3  . fluticasone (FLONASE) 50 MCG/ACT nasal spray Place 2 sprays into both nostrils daily. 16 g 6  . hydrochlorothiazide (HYDRODIURIL) 25 MG tablet Take 25 mg by mouth daily.    . hydrOXYzine (ATARAX/VISTARIL) 50 MG tablet TAKE 2 TABLETS (100 MG TOTAL) BY MOUTH AT BEDTIME AS NEEDED (INSOMNIA). 30 tablet 0  . ipratropium (ATROVENT HFA) 17 MCG/ACT inhaler Inhale 2 puffs into the lungs every 4 (four) hours as needed for wheezing. 1 Inhaler 12  . montelukast (SINGULAIR) 10 MG tablet TAKE 1 TABLET BY MOUTH EVERYDAY AT BEDTIME 90 tablet 0  . Multiple Vitamins-Minerals (MULTIVITAMIN GUMMIES ADULT) CHEW Chew 3 each by mouth daily.    Marland Kitchen  potassium chloride SA (K-DUR,KLOR-CON) 20 MEQ tablet Take 20 mEq by mouth daily.    . traMADol (ULTRAM) 50 MG tablet Take 50 mg by mouth every 6 (six) hours as needed for moderate pain.    . traZODone (DESYREL) 50 MG tablet Take 1 tablet (50 mg total) by mouth at bedtime. 90 tablet 1  . TURMERIC PO Take 5 mLs by  mouth daily.     No facility-administered medications prior to visit.     Allergies  Allergen Reactions  . Ace Inhibitors Other (See Comments)    Reaction:  Unknown   . Other Swelling and Other (See Comments)    Pt states that she is allergic to artificial sweeteners.   Reaction:  Facial swelling   . Penicillins Other (See Comments)    Reaction:  Yeast infection  Has patient had a PCN reaction causing immediate rash, facial/tongue/throat swelling, SOB or lightheadedness with hypotension: No Has patient had a PCN reaction causing severe rash involving mucus membranes or skin necrosis: No Has patient had a PCN reaction that required hospitalization No Has patient had a PCN reaction occurring within the last 10 years: No If all of the above answers are "NO", then may proceed with Cephalosporin use.    ROS Review of Systems Review of Systems  Constitutional: Negative for activity change, appetite change, chills and fever.  HENT: Negative for congestion, nosebleeds, trouble swallowing and voice change.   Respiratory: Negative for cough, shortness of breath and wheezing.   Gastrointestinal: Negative for diarrhea, nausea and vomiting.  Genitourinary: Negative for difficulty urinating, dysuria, flank pain and hematuria.  Musculoskeletal: Negative for back pain, joint swelling and neck pain.  Neurological: Negative for dizziness, speech difficulty, light-headedness and numbness.  See HPI. All other review of systems negative.     Objective:    Physical Exam  BP 131/81 (BP Location: Left Arm, Patient Position: Sitting, Cuff Size: Large)   Pulse 75   Temp 98.2 F (36.8 C) (Oral)   Resp 17   Ht 5' 2.8" (1.595 m)   Wt 210 lb 3.2 oz (95.3 kg)   SpO2 96%   BMI 37.47 kg/m  Wt Readings from Last 3 Encounters:  10/12/18 210 lb 3.2 oz (95.3 kg)  11/28/17 205 lb 9.6 oz (93.3 kg)  11/26/17 205 lb 9.6 oz (93.3 kg)   Physical Exam  Constitutional: Oriented to person, place, and time.  Appears well-developed and well-nourished.  HENT:  Head: Normocephalic and atraumatic.  Eyes: Conjunctivae and EOM are normal.  Cardiovascular: Normal rate, regular rhythm, normal heart sounds and intact distal pulses.  No murmur heard. Pulmonary/Chest: Effort normal and breath sounds normal. No stridor. No respiratory distress. Has no wheezes.  Neurological: Is alert and oriented to person, place, and time.  Lower extremities: trace edema worse on the right ankle than the left Skin: Skin is warm. Capillary refill takes less than 2 seconds.  Psychiatric: Has a normal mood and affect. Behavior is normal. Judgment and thought content normal.    Health Maintenance Due  Topic Date Due  . TETANUS/TDAP  04/10/1956  . INFLUENZA VACCINE  08/15/2018    There are no preventive care reminders to display for this patient.  Lab Results  Component Value Date   TSH 1.690 10/15/2016   Lab Results  Component Value Date   WBC 5.3 05/09/2017   HGB 13.4 05/09/2017   HCT 42.1 05/09/2017   MCV 81.8 05/09/2017   PLT 235 11/14/2016   Lab Results  Component  Value Date   NA 138 11/14/2016   K 4.1 11/14/2016   CO2 24 11/14/2016   GLUCOSE 98 11/14/2016   BUN 9 11/14/2016   CREATININE 0.96 11/14/2016   BILITOT 0.6 11/14/2016   ALKPHOS 96 11/14/2016   AST 18 11/14/2016   ALT 19 11/14/2016   PROT 7.2 11/14/2016   ALBUMIN 4.2 11/14/2016   CALCIUM 9.6 11/14/2016   ANIONGAP 8 04/30/2016   GFR 80.23 10/22/2013   Lab Results  Component Value Date   CHOL 168 11/17/2015   Lab Results  Component Value Date   HDL 50 11/17/2015   Lab Results  Component Value Date   LDLCALC 84 11/17/2015   Lab Results  Component Value Date   TRIG 169 (H) 11/17/2015   Lab Results  Component Value Date   CHOLHDL 3.4 11/17/2015   Lab Results  Component Value Date   HGBA1C 5.9 (H) 10/15/2016      Assessment & Plan:   Problem List Items Addressed This Visit    None    Visit Diagnoses    Need for  prophylactic vaccination and inoculation against influenza    -  Primary   Overactive bladder       Elevated blood sugar       Mixed hyperlipidemia         1. Hypertension Will discontinue HCTZ due to increased urination  2. Hypertension: Will increase amlodipine 5mg  to 10mg  and add on a low dose of losartan to help maintain her blood pressure 3. For overactive bladder will defer starting Ditropan LA until after discontinuing hctz to see if her frequency decreases 4. Hypertension and Pedal Edema: She will continue limit salty foods and will continue with Orthopedics, continue exercise Although she is benefiting from HCTZ for her trace LE edema she is having much increased urination so will discontinue 5. Hyperlipidemia and Atherosclerosis:  She should continue her atorvastatin 40mg  and continue walking and exercise    No orders of the defined types were placed in this encounter.   Follow-up: No follow-ups on file.    Forrest Moron, MD

## 2018-10-12 NOTE — Patient Instructions (Addendum)
° ° ° °  If you have lab work done today you will be contacted with your lab results within the next 2 weeks.  If you have not heard from us then please contact us. The fastest way to get your results is to register for My Chart. ° ° °IF you received an x-ray today, you will receive an invoice from [facility]. Please contact [facility] at [phone] with questions or concerns regarding your invoice.  ° °IF you received labwork today, you will receive an invoice from [facility]. Please contact [facility] at [phone] with questions or concerns regarding your invoice.  ° °Our billing staff will not be able to assist you with questions regarding bills from these companies. ° °You will be contacted with the lab results as soon as they are available. The fastest way to get your results is to activate your My Chart account. Instructions are located on the last page of this paperwork. If you have not heard from us regarding the results in 2 weeks, please contact this office. °  ° ° ° °

## 2018-10-13 ENCOUNTER — Telehealth: Payer: Self-pay | Admitting: *Deleted

## 2018-10-13 DIAGNOSIS — M791 Myalgia, unspecified site: Secondary | ICD-10-CM | POA: Diagnosis not present

## 2018-10-13 DIAGNOSIS — M9905 Segmental and somatic dysfunction of pelvic region: Secondary | ICD-10-CM | POA: Diagnosis not present

## 2018-10-13 DIAGNOSIS — M9902 Segmental and somatic dysfunction of thoracic region: Secondary | ICD-10-CM | POA: Diagnosis not present

## 2018-10-13 DIAGNOSIS — M9903 Segmental and somatic dysfunction of lumbar region: Secondary | ICD-10-CM | POA: Diagnosis not present

## 2018-10-13 LAB — COMPREHENSIVE METABOLIC PANEL
ALT: 23 IU/L (ref 0–32)
AST: 19 IU/L (ref 0–40)
Albumin/Globulin Ratio: 1.5 (ref 1.2–2.2)
Albumin: 4.3 g/dL (ref 3.6–4.6)
Alkaline Phosphatase: 101 IU/L (ref 39–117)
BUN/Creatinine Ratio: 9 — ABNORMAL LOW (ref 12–28)
BUN: 9 mg/dL (ref 8–27)
Bilirubin Total: 0.4 mg/dL (ref 0.0–1.2)
CO2: 23 mmol/L (ref 20–29)
Calcium: 10.1 mg/dL (ref 8.7–10.3)
Chloride: 101 mmol/L (ref 96–106)
Creatinine, Ser: 1 mg/dL (ref 0.57–1.00)
GFR calc Af Amer: 61 mL/min/{1.73_m2} (ref >59)
GFR calc non Af Amer: 53 mL/min/{1.73_m2} — ABNORMAL LOW (ref >59)
Globulin, Total: 2.9 g/dL (ref 1.5–4.5)
Glucose: 99 mg/dL (ref 65–99)
Potassium: 4 mmol/L (ref 3.5–5.2)
Sodium: 137 mmol/L (ref 134–144)
Total Protein: 7.2 g/dL (ref 6.0–8.5)

## 2018-10-13 LAB — LIPID PANEL
Chol/HDL Ratio: 3.5 ratio (ref 0.0–4.4)
Cholesterol, Total: 163 mg/dL (ref 100–199)
HDL: 47 mg/dL (ref >39)
LDL Chol Calc (NIH): 76 mg/dL (ref 0–99)
Triglycerides: 246 mg/dL — ABNORMAL HIGH (ref 0–149)
VLDL Cholesterol Cal: 40 mg/dL (ref 5–40)

## 2018-10-13 LAB — HEMOGLOBIN A1C
Est. average glucose Bld gHb Est-mCnc: 126 mg/dL
Hgb A1c MFr Bld: 6 % — ABNORMAL HIGH (ref 4.8–5.6)

## 2018-10-13 NOTE — Telephone Encounter (Signed)
Schedule AWV.  

## 2018-10-15 ENCOUNTER — Encounter: Payer: Self-pay | Admitting: Radiology

## 2018-10-15 ENCOUNTER — Ambulatory Visit (INDEPENDENT_AMBULATORY_CARE_PROVIDER_SITE_OTHER): Payer: Medicare Other | Admitting: Family Medicine

## 2018-10-15 VITALS — BP 131/81 | Ht 62.8 in | Wt 210.0 lb

## 2018-10-15 DIAGNOSIS — Z0000 Encounter for general adult medical examination without abnormal findings: Secondary | ICD-10-CM

## 2018-10-15 NOTE — Patient Instructions (Signed)
Thank you for taking time to come for your Medicare Wellness Visit. I appreciate your ongoing commitment to your health goals. Please review the following plan we discussed and let me know if I can assist you in the future.  Brevon Dewald LPN  Preventive Care 81 Years and Older, Female Preventive care refers to lifestyle choices and visits with your health care provider that can promote health and wellness. This includes:  A yearly physical exam. This is also called an annual well check.  Regular dental and eye exams.  Immunizations.  Screening for certain conditions.  Healthy lifestyle choices, such as diet and exercise. What can I expect for my preventive care visit? Physical exam Your health care provider will check:  Height and weight. These may be used to calculate body mass index (BMI), which is a measurement that tells if you are at a healthy weight.  Heart rate and blood pressure.  Your skin for abnormal spots. Counseling Your health care provider may ask you questions about:  Alcohol, tobacco, and drug use.  Emotional well-being.  Home and relationship well-being.  Sexual activity.  Eating habits.  History of falls.  Memory and ability to understand (cognition).  Work and work environment.  Pregnancy and menstrual history. What immunizations do I need?  Influenza (flu) vaccine  This is recommended every year. Tetanus, diphtheria, and pertussis (Tdap) vaccine  You may need a Td booster every 10 years. Varicella (chickenpox) vaccine  You may need this vaccine if you have not already been vaccinated. Zoster (shingles) vaccine  You may need this after age 81. Pneumococcal conjugate (PCV13) vaccine  One dose is recommended after age 81. Pneumococcal polysaccharide (PPSV23) vaccine  One dose is recommended after age 81. Measles, mumps, and rubella (MMR) vaccine  You may need at least one dose of MMR if you were born in 1957 or later. You may also  need a second dose. Meningococcal conjugate (MenACWY) vaccine  You may need this if you have certain conditions. Hepatitis A vaccine  You may need this if you have certain conditions or if you travel or work in places where you may be exposed to hepatitis A. Hepatitis B vaccine  You may need this if you have certain conditions or if you travel or work in places where you may be exposed to hepatitis B. Haemophilus influenzae type b (Hib) vaccine  You may need this if you have certain conditions. You may receive vaccines as individual doses or as more than one vaccine together in one shot (combination vaccines). Talk with your health care provider about the risks and benefits of combination vaccines. What tests do I need? Blood tests  Lipid and cholesterol levels. These may be checked every 5 years, or more frequently depending on your overall health.  Hepatitis C test.  Hepatitis B test. Screening  Lung cancer screening. You may have this screening every year starting at age 81 if you have a 30-pack-year history of smoking and currently smoke or have quit within the past 15 years.  Colorectal cancer screening. All adults should have this screening starting at age 81 and continuing until age 75. Your health care provider may recommend screening at age 81 if you are at increased risk. You will have tests every 1-10 years, depending on your results and the type of screening test.  Diabetes screening. This is done by checking your blood sugar (glucose) after you have not eaten for a while (fasting). You may have this done every 1-3   years.  Mammogram. This may be done every 1-2 years. Talk with your health care provider about how often you should have regular mammograms.  BRCA-related cancer screening. This may be done if you have a family history of breast, ovarian, tubal, or peritoneal cancers. Other tests  Sexually transmitted disease (STD) testing.  Bone density scan. This is done  to screen for osteoporosis. You may have this done starting at age 81. Follow these instructions at home: Eating and drinking  Eat a diet that includes fresh fruits and vegetables, whole grains, lean protein, and low-fat dairy products. Limit your intake of foods with high amounts of sugar, saturated fats, and salt.  Take vitamin and mineral supplements as recommended by your health care provider.  Do not drink alcohol if your health care provider tells you not to drink.  If you drink alcohol: ? Limit how much you have to 0-1 drink a day. ? Be aware of how much alcohol is in your drink. In the [place] one drink equals one 12 oz bottle of beer (355 mL), one 5 oz glass of wine (148 mL), or one 1 oz glass of hard liquor (44 mL). Lifestyle  Take daily care of your teeth and gums.  Stay active. Exercise for at least 30 minutes on 5 or more days each week.  Do not use any products that contain nicotine or tobacco, such as cigarettes, e-cigarettes, and chewing tobacco. If you need help quitting, ask your health care provider.  If you are sexually active, practice safe sex. Use a condom or other form of protection in order to prevent STIs (sexually transmitted infections).  Talk with your health care provider about taking a low-dose aspirin or statin. What's next?  Go to your health care provider once a year for a well check visit.  Ask your health care provider how often you should have your eyes and teeth checked.  Stay up to date on all vaccines. This information is not intended to replace advice given to you by your health care provider. Make sure you discuss any questions you have with your health care provider. Document Released: 01/27/2015 Document Revised: 12/25/2017 Document Reviewed: 12/25/2017 Elsevier Patient Education  2020 [employer].

## 2018-10-15 NOTE — Progress Notes (Signed)
Presents today for [employer] Visit   Date of last exam: 10/12/2018  Interpreter used for this visit? No  I connected with  Kathleen Pacheco on [id number] by a application and verified that I am speaking with the correct person using two identifiers.   I discussed the limitations of evaluation and management by telemedicine. The patient expressed understanding and agreed to proceed.   Patient Care Team: Forrest Moron, MD as PCP - General (Internal Medicine)   Other items to address today:   Discussed immunization Discussed Eye/Dental Follow up scheduled 01/04/2019 Dr. Nolon Rod   Other Screening: Last screening for diabetes: 10-12-2018 Last lipid screening: 10/12/2018  ADVANCE DIRECTIVES: Discussed: yes On File: no Materials Provided: yes (mailed)  Immunization status:  Immunization History  Administered Date(s) Administered  . Influenza,inj,Quad PF,6+ Mos 10/05/2014, 11/17/2015, 10/15/2016  . Pneumococcal Conjugate-13 10/22/2013, 10/05/2014  . Pneumococcal Polysaccharide-23 11/17/2015     Health Maintenance Due  Topic Date Due  . Samul Dada  04/10/1954     Functional Status Survey: Is the patient deaf or have difficulty hearing?: No Does the patient have difficulty seeing, even when wearing glasses/contacts?: No Does the patient have difficulty concentrating, remembering, or making decisions?: No Does the patient have difficulty walking or climbing stairs?: No Does the patient have difficulty dressing or bathing?: No Does the patient have difficulty doing errands alone such as visiting a doctor's office or shopping?: No   6CIT Screen 10/15/2018  What Year? 0 points  What month? 0 points  What time? 0 points  Count back from 20 0 points  Months in reverse 0 points  Repeat phrase 0 points  Total Score 0        Clinical Support from 10/15/2018 in [facility]  AUDIT-C Score  2       Home Environment:   Lives one  story home  No trouble climbing stairs Yes grab bars No scattered rugs Adequate lighting Discussed keeping clutter off floor    Patient Active Problem List   Diagnosis Date Noted  . Acute bronchitis 03/18/2016  . Osteoarthrosis, hand 10/22/2013  . DIZZINESS 02/02/2009  . HYPERCHOLESTEROLEMIA 01/25/2009  . HYPERTENSION 01/25/2009     Past Medical History:  Diagnosis Date  . Bradycardia   . Gout   . Hx of bladder infections   . Hyperlipidemia   . Hypertension   . Migraine   . Thyroid disease   . Vertigo      Past Surgical History:  Procedure Laterality Date  . ABDOMINAL HYSTERECTOMY    . CHOLECYSTECTOMY    . GALLBLADDER SURGERY    . PARATHYROIDECTOMY       History reviewed. No pertinent family history.   Social History   Socioeconomic History  . Marital status: Divorced    Spouse name: Not on file  . Number of children: 3  . Years of education: Not on file  . Highest education level: Not on file  Occupational History  . Not on file  Social Needs  . Financial resource strain: Not on file  . Food insecurity    Worry: Not on file    Inability: Not on file  . Transportation needs    Medical: Not on file    Non-medical: Not on file  Tobacco Use  . Smoking status: Never Smoker  . Smokeless tobacco: Never Used  Substance and Sexual Activity  . Alcohol use: Yes    Alcohol/week: 1.0 standard drinks  Types: 1 Glasses of wine per week  . Drug use: No  . Sexual activity: Not Currently    Birth control/protection: Surgical    Comment: HYST   Lifestyle  . Physical activity    Days per week: Not on file    Minutes per session: Not on file  . Stress: Not on file  Relationships  . Social Herbalist on phone: Not on file    Gets together: Not on file    Attends religious service: Not on file    Active member of club or organization: Not on file    Attends meetings of clubs or organizations: Not on file    Relationship status: Not on file   . Intimate partner violence    Fear of current or ex partner: Not on file    Emotionally abused: Not on file    Physically abused: Not on file    Forced sexual activity: Not on file  Other Topics Concern  . Not on file  Social History Narrative   Diet?      Do you drink/eat things with caffeine? Cup of macho- 1/week      Marital status?                divorced                    What year were you married? 1964      Do you live in a house, apartment, assisted living, condo, trailer, etc.? house      Is it one or more stories? one      How many persons live in your home? 1       Do you have any pets in your home? (please list) no      Current or past profession: Retired Pharmacist, hospital       Do you exercise? Work sometimes                                     Type & how often?      Do you have a living will? no      Do you have a DNR form?   no                               If not, do you want to discuss one? no      Do you have signed POA/HPOA for forms?      Allergies  Allergen Reactions  . Ace Inhibitors Other (See Comments)    Reaction:  Unknown   . Other Swelling and Other (See Comments)    Pt states that she is allergic to artificial sweeteners.   Reaction:  Facial swelling   . Penicillins Other (See Comments)    Reaction:  Yeast infection  Has patient had a PCN reaction causing immediate rash, facial/tongue/throat swelling, SOB or lightheadedness with hypotension: No Has patient had a PCN reaction causing severe rash involving mucus membranes or skin necrosis: No Has patient had a PCN reaction that required hospitalization No Has patient had a PCN reaction occurring within the last 10 years: No If all of the above answers are "NO", then may proceed with Cephalosporin use.     Prior to Admission medications   Medication Sig Start Date End Date Taking? Authorizing Provider  albuterol (  PROVENTIL HFA;VENTOLIN HFA) 108 (90 Base) MCG/ACT inhaler INHALE 2 PUFFS INTO THE  LUNGS EVERY 4 HOURS AS NEEDED FOR WHEEZING, SHORTNESS OF BREATH, OR COUGH 03/27/18  Yes Stallings, Zoe A, MD  Alpha-D-Galactosidase (BEANO) TABS Take 2 tablets by mouth every 6 (six) hours as needed (for gas).   Yes [provider]  amLODipine (NORVASC) 5 MG tablet Take 5 mg by mouth daily.   Yes [provider]  aspirin EC 81 MG tablet Take 81 mg by mouth daily.   Yes [provider]  atorvastatin (LIPITOR) 40 MG tablet Take 40 mg by mouth daily.   Yes [provider]  B Complex-C (B-COMPLEX WITH VITAMIN C) tablet Take 1 tablet by mouth daily.   Yes [provider]  beclomethasone (QVAR REDIHALER) 80 MCG/ACT inhaler Inhale 2 puffs in the morning and 1 puff in the evening. 11/26/17  Yes Timmothy Euler, [place] D, PA-C  cetirizine (ZYRTEC) 10 MG tablet Take 1 tablet (10 mg total) by mouth daily as needed for allergies. 11/26/17  Yes Timmothy Euler, [place] D, PA-C  fluticasone (FLONASE) 50 MCG/ACT nasal spray Place 2 sprays into both nostrils daily. 11/26/17  Yes Timmothy Euler, [place] D, PA-C  hydrochlorothiazide (HYDRODIURIL) 25 MG tablet Take 25 mg by mouth daily.   Yes [provider]  meloxicam (MOBIC) 15 MG tablet Take 15 mg by mouth daily.   Yes [provider]  Multiple Vitamins-Minerals (MULTIVITAMIN GUMMIES ADULT) CHEW Chew 3 each by mouth daily.   Yes [provider]  potassium chloride SA (K-DUR,KLOR-CON) 20 MEQ tablet Take 20 mEq by mouth daily.   Yes [provider]  triamcinolone cream (KENALOG) 0.1 % Apply 1 application topically 2 (two) times daily. 10/12/18  Yes Stallings, Zoe A, MD  TURMERIC PO Take 5 mLs by mouth daily.   Yes [provider]  hydrOXYzine (ATARAX/VISTARIL) 50 MG tablet TAKE 2 TABLETS (100 MG TOTAL) BY MOUTH AT BEDTIME AS NEEDED (INSOMNIA). Patient not taking: Reported on 10/15/2018 12/09/16   Jaynee Eagles, PA-C  ipratropium (ATROVENT HFA) 17 MCG/ACT inhaler Inhale 2 puffs into the lungs every 4  (four) hours as needed for wheezing. Patient not taking: Reported on 10/15/2018 11/26/17   Tenna Delaine D, PA-C  montelukast (SINGULAIR) 10 MG tablet TAKE 1 TABLET BY MOUTH EVERYDAY AT BEDTIME Patient not taking: Reported on 10/15/2018 11/26/17   Tenna Delaine D, PA-C  traMADol (ULTRAM) 50 MG tablet Take 50 mg by mouth every 6 (six) hours as needed for moderate pain.    [provider]  traZODone (DESYREL) 50 MG tablet Take 1 tablet (50 mg total) by mouth at bedtime. Patient not taking: Reported on 10/15/2018 10/02/17   Leonie Douglas, [place]     Depression screen [facility] 2/9 10/15/2018 10/12/2018 11/28/2017 11/26/2017 07/02/2017  Decreased Interest 0 0 0 0 0  Down, Depressed, Hopeless 0 0 0 0 0  PHQ - 2 Score 0 0 0 0 0     Fall Risk  10/15/2018 10/12/2018 11/28/2017 11/26/2017 07/02/2017  Falls in the past year? 0 0 0 0 No  Number falls in past yr: 0 0 - - -  Injury with Fall? 0 0 - - -  Follow up Falls evaluation completed;Education provided;Falls prevention discussed Falls evaluation completed - - -      PHYSICAL EXAM: BP 131/81 Comment: taken from previous  Ht 5' 2.8" (1.595 m)   Wt 210 lb (95.3 kg)   BMI 37.44 kg/m    Wt Readings from Last 3  Encounters:  10/15/18 210 lb (95.3 kg)  10/12/18 210 lb 3.2 oz (95.3 kg)  11/28/17 205 lb 9.6 oz (93.3 kg)    Medicare annual wellness visit, subsequent     Physical Exam   Education/Counseling provided regarding diet and exercise, prevention of chronic diseases, smoking/tobacco cessation, if applicable, and reviewed "Covered Medicare Preventive Services."

## 2018-10-28 DIAGNOSIS — M9903 Segmental and somatic dysfunction of lumbar region: Secondary | ICD-10-CM | POA: Diagnosis not present

## 2018-10-28 DIAGNOSIS — M9902 Segmental and somatic dysfunction of thoracic region: Secondary | ICD-10-CM | POA: Diagnosis not present

## 2018-10-28 DIAGNOSIS — M9905 Segmental and somatic dysfunction of pelvic region: Secondary | ICD-10-CM | POA: Diagnosis not present

## 2018-10-28 DIAGNOSIS — M791 Myalgia, unspecified site: Secondary | ICD-10-CM | POA: Diagnosis not present

## 2018-11-05 DIAGNOSIS — M1990 Unspecified osteoarthritis, unspecified site: Secondary | ICD-10-CM | POA: Diagnosis not present

## 2018-11-05 DIAGNOSIS — I25119 Atherosclerotic heart disease of native coronary artery with unspecified angina pectoris: Secondary | ICD-10-CM | POA: Diagnosis not present

## 2018-11-05 DIAGNOSIS — R7303 Prediabetes: Secondary | ICD-10-CM | POA: Diagnosis not present

## 2018-11-05 DIAGNOSIS — I10 Essential (primary) hypertension: Secondary | ICD-10-CM | POA: Diagnosis not present

## 2018-11-05 DIAGNOSIS — R55 Syncope and collapse: Secondary | ICD-10-CM | POA: Diagnosis not present

## 2018-11-05 DIAGNOSIS — E785 Hyperlipidemia, unspecified: Secondary | ICD-10-CM | POA: Diagnosis not present

## 2018-11-10 DIAGNOSIS — Z1231 Encounter for screening mammogram for malignant neoplasm of breast: Secondary | ICD-10-CM | POA: Diagnosis not present

## 2018-11-10 DIAGNOSIS — Z01419 Encounter for gynecological examination (general) (routine) without abnormal findings: Secondary | ICD-10-CM | POA: Diagnosis not present

## 2018-11-10 DIAGNOSIS — M8580 Other specified disorders of bone density and structure, unspecified site: Secondary | ICD-10-CM | POA: Diagnosis not present

## 2018-11-20 DIAGNOSIS — M791 Myalgia, unspecified site: Secondary | ICD-10-CM | POA: Diagnosis not present

## 2018-11-20 DIAGNOSIS — M9902 Segmental and somatic dysfunction of thoracic region: Secondary | ICD-10-CM | POA: Diagnosis not present

## 2018-11-20 DIAGNOSIS — M9905 Segmental and somatic dysfunction of pelvic region: Secondary | ICD-10-CM | POA: Diagnosis not present

## 2018-11-20 DIAGNOSIS — M9903 Segmental and somatic dysfunction of lumbar region: Secondary | ICD-10-CM | POA: Diagnosis not present

## 2018-11-30 ENCOUNTER — Ambulatory Visit (INDEPENDENT_AMBULATORY_CARE_PROVIDER_SITE_OTHER): Payer: Medicare Other | Admitting: Family Medicine

## 2018-11-30 ENCOUNTER — Other Ambulatory Visit: Payer: Self-pay

## 2018-11-30 ENCOUNTER — Encounter: Payer: Self-pay | Admitting: Family Medicine

## 2018-11-30 VITALS — BP 142/76 | HR 68 | Temp 98.6°F | Ht 62.5 in | Wt 201.6 lb

## 2018-11-30 DIAGNOSIS — J309 Allergic rhinitis, unspecified: Secondary | ICD-10-CM | POA: Diagnosis not present

## 2018-11-30 DIAGNOSIS — M791 Myalgia, unspecified site: Secondary | ICD-10-CM | POA: Diagnosis not present

## 2018-11-30 DIAGNOSIS — J4520 Mild intermittent asthma, uncomplicated: Secondary | ICD-10-CM | POA: Diagnosis not present

## 2018-11-30 DIAGNOSIS — M9902 Segmental and somatic dysfunction of thoracic region: Secondary | ICD-10-CM | POA: Diagnosis not present

## 2018-11-30 DIAGNOSIS — I10 Essential (primary) hypertension: Secondary | ICD-10-CM | POA: Diagnosis not present

## 2018-11-30 DIAGNOSIS — I7090 Unspecified atherosclerosis: Secondary | ICD-10-CM

## 2018-11-30 DIAGNOSIS — M9905 Segmental and somatic dysfunction of pelvic region: Secondary | ICD-10-CM | POA: Diagnosis not present

## 2018-11-30 DIAGNOSIS — M9903 Segmental and somatic dysfunction of lumbar region: Secondary | ICD-10-CM | POA: Diagnosis not present

## 2018-11-30 DIAGNOSIS — Z760 Encounter for issue of repeat prescription: Secondary | ICD-10-CM | POA: Diagnosis not present

## 2018-11-30 MED ORDER — MONTELUKAST SODIUM 10 MG PO TABS: ORAL_TABLET | ORAL | 0 refills | Status: DC

## 2018-11-30 MED ORDER — QVAR REDIHALER 80 MCG/ACT IN AERB: INHALATION_SPRAY | RESPIRATORY_TRACT | 2 refills | Status: AC

## 2018-11-30 MED ORDER — ALBUTEROL SULFATE HFA 108 (90 BASE) MCG/ACT IN AERS: INHALATION_SPRAY | RESPIRATORY_TRACT | 1 refills | Status: AC

## 2018-11-30 MED ORDER — FLUTICASONE PROPIONATE 50 MCG/ACT NA SUSP: 2.0000 | Freq: Every day | NASAL | 6 refills | Status: DC

## 2018-11-30 NOTE — Progress Notes (Signed)
Established Patient Office Visit  Subjective:  Patient ID: Kathleen Pacheco, female    DOB: 12-Dec-1935  Age: 83 y.o. MRN: [id number]  CC:  Chief Complaint  Patient presents with  . Chronic conditions    4 m f/u after seeing Cards  . Cough    allergies, off and on a month     HPI Laketa S Knowles presents for   Patient saw Dr. Terrence Dupont for atherosclerosis  She is on lipitor 40mg , zetia and aspirin She is not on nitrates She denies DOE, chest pain   Hypertension: Patient here for follow-up of elevated blood pressure. She is not exercising and is adherent to low salt diet.  Blood pressure is well controlled at home. Cardiac symptoms none. Patient denies chest pressure/discomfort, dyspnea, irregular heart beat, lower extremity edema, orthopnea and palpitations.  Cardiovascular risk factors: advanced age (older than 61 for men, 2 for women), dyslipidemia, hypertension and obesity (BMI >= 30 kg/m2). Use of agents associated with hypertension: none. History of target organ damage: atherosclerosis. BP Readings from Last 3 Encounters:  11/30/18 (!) 142/76  10/15/18 131/81  10/12/18 131/81   She is on HCTZ 25mg  and amlodipine. She denies dizziness She is exercising She is avoiding red meat but eats pork.   Cough She reports that she is going out of town and would like something to take  She reports that she gets a cough that is typically triggered by numerous things Dust, damp and weather changes She takes qvar, and albuterol  Past Medical History:  Diagnosis Date  . Bradycardia   . Gout   . Hx of bladder infections   . Hyperlipidemia   . Hypertension   . Migraine   . Thyroid disease   . Vertigo     Past Surgical History:  Procedure Laterality Date  . ABDOMINAL HYSTERECTOMY    . CHOLECYSTECTOMY    . GALLBLADDER SURGERY    . PARATHYROIDECTOMY      History reviewed. No pertinent family history.  Social History   Socioeconomic History  . Marital status: Divorced     Spouse name: Not on file  . Number of children: 3  . Years of education: Not on file  . Highest education level: Not on file  Occupational History  . Not on file  Social Needs  . Financial resource strain: Not on file  . Food insecurity    Worry: Not on file    Inability: Not on file  . Transportation needs    Medical: Not on file    Non-medical: Not on file  Tobacco Use  . Smoking status: Never Smoker  . Smokeless tobacco: Never Used  Substance and Sexual Activity  . Alcohol use: Yes    Alcohol/week: 1.0 standard drinks    Types: 1 Glasses of wine per week  . Drug use: No  . Sexual activity: Not Currently    Birth control/protection: Surgical    Comment: HYST   Lifestyle  . Physical activity    Days per week: Not on file    Minutes per session: Not on file  . Stress: Not on file  Relationships  . Social Herbalist on phone: Not on file    Gets together: Not on file    Attends religious service: Not on file    Active member of club or organization: Not on file    Attends meetings of clubs or organizations: Not on file    Relationship status: Not on  file  . Intimate partner violence    Fear of current or ex partner: Not on file    Emotionally abused: Not on file    Physically abused: Not on file    Forced sexual activity: Not on file  Other Topics Concern  . Not on file  Social History Narrative   Diet?      Do you drink/eat things with caffeine? Cup of macho- 1/week      Marital status?                divorced                    What year were you married? 1964      Do you live in a house, apartment, assisted living, condo, trailer, etc.? house      Is it one or more stories? one      How many persons live in your home? 1       Do you have any pets in your home? (please list) no      Current or past profession: Retired Pharmacist, hospital       Do you exercise? Work sometimes                                     Type & how often?      Do you have a  living will? no      Do you have a DNR form?   no                               If not, do you want to discuss one? no      Do you have signed POA/HPOA for forms?     Outpatient Medications Prior to Visit  Medication Sig Dispense Refill  . Alpha-D-Galactosidase (BEANO) TABS Take 2 tablets by mouth every 6 (six) hours as needed (for gas).    Marland Kitchen amLODipine (NORVASC) 5 MG tablet Take 5 mg by mouth daily.    Marland Kitchen aspirin EC 81 MG tablet Take 81 mg by mouth daily.    . B Complex-C (B-COMPLEX WITH VITAMIN C) tablet Take 1 tablet by mouth daily.    . cetirizine (ZYRTEC) 10 MG tablet Take 1 tablet (10 mg total) by mouth daily as needed for allergies. 90 tablet 3  . meloxicam (MOBIC) 15 MG tablet Take 15 mg by mouth daily.    . Multiple Vitamins-Minerals (MULTIVITAMIN GUMMIES ADULT) CHEW Chew 3 each by mouth daily.    . potassium chloride SA (K-DUR,KLOR-CON) 20 MEQ tablet Take 20 mEq by mouth daily.    . TURMERIC PO Take 5 mLs by mouth daily.    Marland Kitchen albuterol (PROVENTIL HFA;VENTOLIN HFA) 108 (90 Base) MCG/ACT inhaler INHALE 2 PUFFS INTO THE LUNGS EVERY 4 HOURS AS NEEDED FOR WHEEZING, SHORTNESS OF BREATH, OR COUGH 6.7 Inhaler 1  . beclomethasone (QVAR REDIHALER) 80 MCG/ACT inhaler Inhale 2 puffs in the morning and 1 puff in the evening. 10.6 g 2  . atorvastatin (LIPITOR) 40 MG tablet Take 40 mg by mouth daily.    Marland Kitchen ezetimibe (ZETIA) 10 MG tablet ezetimibe 10 mg tablet    . hydrochlorothiazide (HYDRODIURIL) 25 MG tablet Take 25 mg by mouth daily.    . traMADol (ULTRAM) 50 MG tablet Take 50 mg by mouth every 6 (six) hours  as needed for moderate pain.    . fluticasone (FLONASE) 50 MCG/ACT nasal spray Place 2 sprays into both nostrils daily. (Patient not taking: Reported on 11/30/2018) 16 g 6  . hydrOXYzine (ATARAX/VISTARIL) 50 MG tablet TAKE 2 TABLETS (100 MG TOTAL) BY MOUTH AT BEDTIME AS NEEDED (INSOMNIA). (Patient not taking: Reported on 10/15/2018) 30 tablet 0  . ipratropium (ATROVENT HFA) 17 MCG/ACT  inhaler Inhale 2 puffs into the lungs every 4 (four) hours as needed for wheezing. (Patient not taking: Reported on 10/15/2018) 1 Inhaler 12  . montelukast (SINGULAIR) 10 MG tablet TAKE 1 TABLET BY MOUTH EVERYDAY AT BEDTIME (Patient not taking: Reported on 10/15/2018) 90 tablet 0  . traZODone (DESYREL) 50 MG tablet Take 1 tablet (50 mg total) by mouth at bedtime. (Patient not taking: Reported on 10/15/2018) 90 tablet 1  . triamcinolone cream (KENALOG) 0.1 % Apply 1 application topically 2 (two) times daily. (Patient not taking: Reported on 11/30/2018) 30 g 0   No facility-administered medications prior to visit.     Allergies  Allergen Reactions  . Ace Inhibitors Other (See Comments)    Reaction:  Unknown   . Aspartame   . Other Swelling and Other (See Comments)    Pt states that she is allergic to artificial sweeteners.   Reaction:  Facial swelling   . Penicillins Other (See Comments)    Reaction:  Yeast infection  Has patient had a PCN reaction causing immediate rash, facial/tongue/throat swelling, SOB or lightheadedness with hypotension: No Has patient had a PCN reaction causing severe rash involving mucus membranes or skin necrosis: No Has patient had a PCN reaction that required hospitalization No Has patient had a PCN reaction occurring within the last 10 years: No If all of the above answers are "NO", then may proceed with Cephalosporin use.    ROS Review of Systems    Objective:    Physical Exam  BP (!) 142/76   Pulse 68   Temp 98.6 F (37 C) (Oral)   Ht 5' 2.5" (1.588 m)   Wt 201 lb 9.6 oz (91.4 kg)   SpO2 94%   BMI 36.29 kg/m  Wt Readings from Last 3 Encounters:  11/30/18 201 lb 9.6 oz (91.4 kg)  10/15/18 210 lb (95.3 kg)  10/12/18 210 lb 3.2 oz (95.3 kg)   Physical Exam  Constitutional: Oriented to person, place, and time. Appears well-developed and well-nourished.  HENT:  Head: Normocephalic and atraumatic.  Eyes: Conjunctivae and EOM are normal.   Cardiovascular: Normal rate, regular rhythm, normal heart sounds and intact distal pulses.  +murmur Pulmonary/Chest: Effort normal and breath sounds normal. No stridor. No respiratory distress. Has no wheezes.  Neurological: Is alert and oriented to person, place, and time.  Skin: Skin is warm. Capillary refill takes less than 2 seconds.  Psychiatric: Has a normal mood and affect. Behavior is normal. Judgment and thought content normal.    Health Maintenance Due  Topic Date Due  . TETANUS/TDAP  04/10/1956    There are no preventive care reminders to display for this patient.  Lab Results  Component Value Date   TSH 1.690 10/15/2016   Lab Results  Component Value Date   WBC 5.3 05/09/2017   HGB 13.4 05/09/2017   HCT 42.1 05/09/2017   MCV 81.8 05/09/2017   PLT 235 11/14/2016   Lab Results  Component Value Date   NA 137 10/12/2018   K 4.0 10/12/2018   CO2 23 10/12/2018   GLUCOSE 99 10/12/2018  BUN 9 10/12/2018   CREATININE 1.00 10/12/2018   BILITOT 0.4 10/12/2018   ALKPHOS 101 10/12/2018   AST 19 10/12/2018   ALT 23 10/12/2018   PROT 7.2 10/12/2018   ALBUMIN 4.3 10/12/2018   CALCIUM 10.1 10/12/2018   ANIONGAP 8 04/30/2016   GFR 80.23 10/22/2013   Lab Results  Component Value Date   CHOL 163 10/12/2018   Lab Results  Component Value Date   HDL 47 10/12/2018   Lab Results  Component Value Date   LDLCALC 76 10/12/2018   Lab Results  Component Value Date   TRIG 246 (H) 10/12/2018   Lab Results  Component Value Date   CHOLHDL 3.5 10/12/2018   Lab Results  Component Value Date   HGBA1C 6.0 (H) 10/12/2018      Assessment & Plan:   Problem List Items Addressed This Visit    None    Visit Diagnoses    Atherosclerosis    -  Primary Continue zetia, lipitor and exercise   Relevant Medications   ezetimibe (ZETIA) 10 MG tablet   Allergic rhinitis, unspecified seasonality, unspecified trigger       Relevant Medications   montelukast (SINGULAIR) 10 MG  tablet   Encounter for medication refill   Relevant Medications   montelukast (SINGULAIR) 10 MG tablet   fluticasone (FLONASE) 50 MCG/ACT nasal spray   albuterol (VENTOLIN HFA) 108 (90 Base) MCG/ACT inhaler   beclomethasone (QVAR REDIHALER) 80 MCG/ACT inhaler   Mild intermittent asthma without complication    -  Continue asthma meds Increase albuterol Restart singulair Likely seasonal allergies are aggravating asthma Normal lung exam   Relevant Medications   montelukast (SINGULAIR) 10 MG tablet   albuterol (VENTOLIN HFA) 108 (90 Base) MCG/ACT inhaler   beclomethasone (QVAR REDIHALER) 80 MCG/ACT inhaler   Essential hypertension, benign    - bp in good range, discussed diet and exercise Continue current meds   Relevant Medications   ezetimibe (ZETIA) 10 MG tablet      Meds ordered this encounter  Medications  . montelukast (SINGULAIR) 10 MG tablet    Sig: TAKE 1 TABLET BY MOUTH EVERYDAY AT BEDTIME    Dispense:  90 tablet    Refill:  0  . fluticasone (FLONASE) 50 MCG/ACT nasal spray    Sig: Place 2 sprays into both nostrils daily.    Dispense:  16 g    Refill:  6  . albuterol (VENTOLIN HFA) 108 (90 Base) MCG/ACT inhaler    Sig: INHALE 2 PUFFS INTO THE LUNGS EVERY 4 HOURS AS NEEDED FOR WHEEZING, SHORTNESS OF BREATH, OR COUGH    Dispense:  18 g    Refill:  1  . beclomethasone (QVAR REDIHALER) 80 MCG/ACT inhaler    Sig: Inhale 2 puffs in the morning and 1 puff in the evening.    Dispense:  10.6 g    Refill:  2    Follow-up: Return in about 6 months (around 05/30/2019) for hypertension .    Forrest Moron, MD

## 2018-11-30 NOTE — Patient Instructions (Signed)
° ° ° °  If you have lab work done today you will be contacted with your lab results within the next 2 weeks.  If you have not heard from us then please contact us. The fastest way to get your results is to register for My Chart. ° ° °IF you received an x-ray today, you will receive an invoice from [facility]. Please contact [facility] at [phone] with questions or concerns regarding your invoice.  ° °IF you received labwork today, you will receive an invoice from [facility]. Please contact [facility] at [phone] with questions or concerns regarding your invoice.  ° °Our billing staff will not be able to assist you with questions regarding bills from these companies. ° °You will be contacted with the lab results as soon as they are available. The fastest way to get your results is to activate your My Chart account. Instructions are located on the last page of this paperwork. If you have not heard from us regarding the results in 2 weeks, please contact this office. °  ° ° ° °

## 2019-01-04 ENCOUNTER — Ambulatory Visit: Payer: Medicare Other | Admitting: Family Medicine

## 2019-01-05 ENCOUNTER — Ambulatory Visit: Payer: Medicare Other | Admitting: Family Medicine

## 2019-01-12 ENCOUNTER — Other Ambulatory Visit: Payer: Self-pay

## 2019-01-12 ENCOUNTER — Telehealth: Payer: Medicare Other | Admitting: Family Medicine

## 2019-01-12 ENCOUNTER — Telehealth: Payer: Self-pay | Admitting: Family Medicine

## 2019-01-12 NOTE — Telephone Encounter (Signed)
Copied from [place] [id number]. Topic: General - Other >> Jan 12, 2019 12:04 PM Leward Quan A wrote: Reason for CRM: Patient called to inform Dr Nolon Rod assistant that she had a late start to her  day today but will come in tomorrow 01/13/2019 to have labs done. Please advise

## 2019-01-12 NOTE — Telephone Encounter (Signed)
FYI, Patient will come by the office tomorrow. Do you know if the orders has been put into the computer

## 2019-01-12 NOTE — Telephone Encounter (Signed)
Orders are in for pt but she needs an virtual appt.  Asked Ms. Kathleen Pacheco to add pt to stallings schedule tomorrow at 10:20 am since she was coming in for labs/vital signs at 10:00 am

## 2019-01-13 ENCOUNTER — Ambulatory Visit: Payer: Medicare Other

## 2019-01-13 ENCOUNTER — Telehealth (INDEPENDENT_AMBULATORY_CARE_PROVIDER_SITE_OTHER): Payer: Medicare Other | Admitting: Family Medicine

## 2019-01-13 ENCOUNTER — Other Ambulatory Visit: Payer: Self-pay

## 2019-01-13 ENCOUNTER — Encounter: Payer: Self-pay | Admitting: Family Medicine

## 2019-01-13 VITALS — BP 142/89 | HR 56 | Temp 97.9°F | Ht 62.5 in | Wt 207.2 lb

## 2019-01-13 DIAGNOSIS — R053 Chronic cough: Secondary | ICD-10-CM

## 2019-01-13 DIAGNOSIS — R3989 Other symptoms and signs involving the genitourinary system: Secondary | ICD-10-CM | POA: Diagnosis not present

## 2019-01-13 DIAGNOSIS — J4540 Moderate persistent asthma, uncomplicated: Secondary | ICD-10-CM

## 2019-01-13 DIAGNOSIS — R05 Cough: Secondary | ICD-10-CM

## 2019-01-13 LAB — POCT URINALYSIS DIP (MANUAL ENTRY)
Bilirubin, UA: NEGATIVE
Blood, UA: NEGATIVE
Glucose, UA: NEGATIVE mg/dL
Ketones, POC UA: NEGATIVE mg/dL
Nitrite, UA: NEGATIVE
Protein Ur, POC: NEGATIVE mg/dL
Spec Grav, UA: 1.025 (ref 1.010–1.025)
Urobilinogen, UA: 0.2 E.U./dL
pH, UA: 6 (ref 5.0–8.0)

## 2019-01-13 NOTE — Progress Notes (Signed)
Telemedicine Encounter- SOAP NOTE Established Patient  This telephone encounter was conducted with the patient's (or proxy's) verbal consent via audio telecommunications: yes/no: Yes Patient was instructed to have this encounter in a suitably private space; and to only have persons present to whom they give permission to participate. In addition, patient identity was confirmed by use of name plus two identifiers (DOB and address).  I discussed the limitations, risks, security and privacy concerns of performing an evaluation and management service by telephone and the availability of in person appointments. I also discussed with the patient that there may be a patient responsible charge related to this service. The patient expressed understanding and agreed to proceed.  I spent a total of TIME; 0 MIN TO 60 MIN: 15 minutes talking with the patient or their proxy.  Chief Complaint  Patient presents with  . Urinary Frequency    This has been going on for a month now.  . Insomnia    need something for sleep. Have been taking tylenol PM but its not helping    Subjective   Kathleen Pacheco is a 83 y.o. established patient. Telephone visit today for  HPI   Patient reports that she has been having urinary frequency for a month She was out of town in [place] She has to use the bathroom more often that usual to urinate No fevers, no vaginal discharge, abdominal pain or dysuria. She left a urine sample at the office.   Cough Patient reports that she was tested for allergies at [facility] about 10 years ago and has been taking some kind of allergy meds every since but she is still coughing. She was in [place] for a month and it got worse there. She is taking her singulair, qvar, albuterol, zyrtec and is coughing persistently all day for a month She reports that she a dry annoying cough that is there all the time It is not worse with eating It does not give her a break at night.  Patient Active  Problem List   Diagnosis Date Noted  . Acute bronchitis 03/18/2016  . Osteoarthrosis, hand 10/22/2013  . DIZZINESS 02/02/2009  . HYPERCHOLESTEROLEMIA 01/25/2009  . HYPERTENSION 01/25/2009    Past Medical History:  Diagnosis Date  . Bradycardia   . Gout   . Hx of bladder infections   . Hyperlipidemia   . Hypertension   . Migraine   . Thyroid disease   . Vertigo     Current Outpatient Medications  Medication Sig Dispense Refill  . albuterol (VENTOLIN HFA) 108 (90 Base) MCG/ACT inhaler INHALE 2 PUFFS INTO THE LUNGS EVERY 4 HOURS AS NEEDED FOR WHEEZING, SHORTNESS OF BREATH, OR COUGH 18 g 1  . Alpha-D-Galactosidase (BEANO) TABS Take 2 tablets by mouth every 6 (six) hours as needed (for gas).    Marland Kitchen amLODipine (NORVASC) 5 MG tablet Take 5 mg by mouth daily.    Marland Kitchen aspirin EC 81 MG tablet Take 81 mg by mouth daily.    Marland Kitchen atorvastatin (LIPITOR) 40 MG tablet Take 40 mg by mouth daily.    . B Complex-C (B-COMPLEX WITH VITAMIN C) tablet Take 1 tablet by mouth daily.    . beclomethasone (QVAR REDIHALER) 80 MCG/ACT inhaler Inhale 2 puffs in the morning and 1 puff in the evening. 10.6 g 2  . cetirizine (ZYRTEC) 10 MG tablet Take 1 tablet (10 mg total) by mouth daily as needed for allergies. 90 tablet 3  . ezetimibe (ZETIA) 10 MG tablet ezetimibe  10 mg tablet    . fluticasone (FLONASE) 50 MCG/ACT nasal spray Place 2 sprays into both nostrils daily. 16 g 6  . hydrochlorothiazide (HYDRODIURIL) 25 MG tablet Take 25 mg by mouth daily.    . meloxicam (MOBIC) 15 MG tablet Take 15 mg by mouth daily.    . montelukast (SINGULAIR) 10 MG tablet TAKE 1 TABLET BY MOUTH EVERYDAY AT BEDTIME 90 tablet 0  . Multiple Vitamins-Minerals (MULTIVITAMIN GUMMIES ADULT) CHEW Chew 3 each by mouth daily.    . potassium chloride SA (K-DUR,KLOR-CON) 20 MEQ tablet Take 20 mEq by mouth daily.    . traMADol (ULTRAM) 50 MG tablet Take 50 mg by mouth every 6 (six) hours as needed for moderate pain.    . TURMERIC PO Take 5 mLs by  mouth daily.     No current facility-administered medications for this visit.    Allergies  Allergen Reactions  . Ace Inhibitors Other (See Comments)    Reaction:  Unknown   . Aspartame   . Other Swelling and Other (See Comments)    Pt states that she is allergic to artificial sweeteners.   Reaction:  Facial swelling   . Penicillins Other (See Comments)    Reaction:  Yeast infection  Has patient had a PCN reaction causing immediate rash, facial/tongue/throat swelling, SOB or lightheadedness with hypotension: No Has patient had a PCN reaction causing severe rash involving mucus membranes or skin necrosis: No Has patient had a PCN reaction that required hospitalization No Has patient had a PCN reaction occurring within the last 10 years: No If all of the above answers are "NO", then may proceed with Cephalosporin use.    Social History   Socioeconomic History  . Marital status: Divorced    Spouse name: Not on file  . Number of children: 3  . Years of education: Not on file  . Highest education level: Not on file  Occupational History  . Not on file  Tobacco Use  . Smoking status: Never Smoker  . Smokeless tobacco: Never Used  Substance and Sexual Activity  . Alcohol use: Yes    Alcohol/week: 1.0 standard drinks    Types: 1 Glasses of wine per week  . Drug use: No  . Sexual activity: Not Currently    Birth control/protection: Surgical    Comment: HYST   Other Topics Concern  . Not on file  Social History Narrative   Diet?      Do you drink/eat things with caffeine? Cup of macho- 1/week      Marital status?                divorced                    What year were you married? 1964      Do you live in a house, apartment, assisted living, condo, trailer, etc.? house      Is it one or more stories? one      How many persons live in your home? 1       Do you have any pets in your home? (please list) no      Current or past profession: Retired Pharmacist, hospital       Do  you exercise? Work sometimes  Type & how often?      Do you have a living will? no      Do you have a DNR form?   no                               If not, do you want to discuss one? no      Do you have signed POA/HPOA for forms?    Social Determinants of Health   Financial Resource Strain:   . Difficulty of Paying Living Expenses: Not on file  Food Insecurity:   . Worried About Charity fundraiser in the Last Year: Not on file  . Ran Out of Food in the Last Year: Not on file  Transportation Needs:   . Lack of Transportation (Medical): Not on file  . Lack of Transportation (Non-Medical): Not on file  Physical Activity:   . Days of Exercise per Week: Not on file  . Minutes of Exercise per Session: Not on file  Stress:   . Feeling of Stress : Not on file  Social Connections:   . Frequency of Communication with Friends and Family: Not on file  . Frequency of Social Gatherings with Friends and Family: Not on file  . Attends Religious Services: Not on file  . Active Member of Clubs or Organizations: Not on file  . Attends Archivist Meetings: Not on file  . Marital Status: Not on file  Intimate Partner Violence:   . Fear of Current or Ex-Partner: Not on file  . Emotionally Abused: Not on file  . Physically Abused: Not on file  . Sexually Abused: Not on file    ROS Review of Systems  Constitutional: Negative for activity change, appetite change, chills and fever.  HENT: Negative for congestion, nosebleeds, trouble swallowing and voice change.   Respiratory: ++ cough, no shortness of breath and wheezing.   Gastrointestinal: Negative for diarrhea, nausea and vomiting.  Genitourinary: Negative for difficulty urinating, dysuria, flank pain and hematuria. See hpi Musculoskeletal: Negative for back pain, joint swelling and neck pain.  Neurological: Negative for dizziness, speech difficulty, light-headedness and numbness.  See HPI. All  other review of systems negative.   Objective   Vitals as reported by the patient: Today's Vitals   01/13/19 1056  BP: (!) 142/89  Pulse: (!) 56  Temp: 97.9 F (36.6 C)  TempSrc: Temporal  SpO2: 97%  Weight: 207 lb 3.2 oz (94 kg)  Height: 5' 2.5" (1.588 m)   No exam as this was a virtual visit   Zariyah was seen today for urinary frequency and insomnia.  Diagnoses and all orders for this visit:  Possible urinary tract infection- await urine culture -     POCT urine dipstick -     Urine culture  Persistent cough for 3 weeks or longer -     Ambulatory referral to Pulmonology  Moderate persistent asthma without complication   Advised pt to follow up with Pulmonology Patient with worsening asthma Would advise patient to have allergy testing done Continue singulair, zyrtec, qvar and albuterol  I discussed the assessment and treatment plan with the patient. The patient was provided an opportunity to ask questions and all were answered. The patient agreed with the plan and demonstrated an understanding of the instructions.   The patient was advised to call back or seek an in-person evaluation if the symptoms worsen or if the condition fails  to improve as anticipated.  I provided 15 minutes of non-face-to-face time during this encounter.  Forrest Moron, MD  [facility] at [facility]

## 2019-01-13 NOTE — Progress Notes (Signed)
No travel outside the [place] or [place] in the past 3 weeks.  No refills needed.

## 2019-01-13 NOTE — Patient Instructions (Signed)
° ° ° °  If you have lab work done today you will be contacted with your lab results within the next 2 weeks.  If you have not heard from us then please contact us. The fastest way to get your results is to register for My Chart. ° ° °IF you received an x-ray today, you will receive an invoice from [facility]. Please contact [facility] at [phone] with questions or concerns regarding your invoice.  ° °IF you received labwork today, you will receive an invoice from [facility]. Please contact [facility] at [phone] with questions or concerns regarding your invoice.  ° °Our billing staff will not be able to assist you with questions regarding bills from these companies. ° °You will be contacted with the lab results as soon as they are available. The fastest way to get your results is to activate your My Chart account. Instructions are located on the last page of this paperwork. If you have not heard from us regarding the results in 2 weeks, please contact this office. °  ° ° ° °

## 2019-01-14 LAB — URINE CULTURE

## 2019-01-22 ENCOUNTER — Encounter: Payer: Self-pay | Admitting: Radiology

## 2019-02-08 DIAGNOSIS — L905 Scar conditions and fibrosis of skin: Secondary | ICD-10-CM | POA: Diagnosis not present

## 2019-02-09 ENCOUNTER — Other Ambulatory Visit: Payer: Self-pay | Admitting: Family Medicine

## 2019-02-09 DIAGNOSIS — L309 Dermatitis, unspecified: Secondary | ICD-10-CM

## 2019-02-22 ENCOUNTER — Other Ambulatory Visit: Payer: Self-pay | Admitting: Family Medicine

## 2019-02-22 DIAGNOSIS — J309 Allergic rhinitis, unspecified: Secondary | ICD-10-CM

## 2019-02-22 NOTE — Telephone Encounter (Signed)
Requested Prescriptions  Pending Prescriptions Disp Refills  . montelukast (SINGULAIR) 10 MG tablet [Pharmacy Med Name: MONTELUKAST SOD 10 MG TABLET] 90 tablet 0    Sig: TAKE 1 TABLET BY MOUTH EVERYDAY AT BEDTIME     Pulmonology:  Leukotriene Inhibitors Passed - 02/22/2019  1:03 AM      Passed - Valid encounter within last 12 months    Recent Outpatient Visits          1 month ago Possible urinary tract infection   [facility] at [place], MD   2 months ago Atherosclerosis   Primary Care at [facility], Arlie Solomons, MD   4 months ago Medicare annual wellness visit, subsequent   [facility], Lilia Argue, MD   4 months ago Overactive bladder   [facility] at [place], MD   1 year ago Mild intermittent asthma with acute exacerbation   Primary Care at [facility], Reather Laurence, PA-C      Future Appointments            In 3 months Forrest Moron, MD Primary [facility] at [place], [facility]

## 2019-02-25 ENCOUNTER — Other Ambulatory Visit: Payer: Self-pay | Admitting: Family Medicine

## 2019-02-25 DIAGNOSIS — L309 Dermatitis, unspecified: Secondary | ICD-10-CM

## 2019-02-25 NOTE — Telephone Encounter (Signed)
Requested medication (s) are due for refill today: yes  Requested medication (s) are on the active medication list: no  Last refill:  10/12/2018  Future visit scheduled:no  Notes to clinic: d/c'd 11/2018    Requested Prescriptions  Pending Prescriptions Disp Refills   triamcinolone cream (KENALOG) 0.1 % [Pharmacy Med Name: TRIAMCINOLONE 0.1% CREAM] 30 g 0    Sig: APPLY TO [place]      Dermatology:  Corticosteroids Passed - 02/25/2019  9:11 AM      Passed - Valid encounter within last 12 months    Recent Outpatient Visits           1 month ago Possible urinary tract infection   [facility] at [place], MD   2 months ago Atherosclerosis   Primary Care at [facility], Arlie Solomons, MD   4 months ago Medicare annual wellness visit, subsequent   [facility], Lilia Argue, MD   4 months ago Overactive bladder   [facility] at [place], MD   1 year ago Mild intermittent asthma with acute exacerbation   Primary Care at [facility], Reather Laurence, PA-C       Future Appointments             In 3 months Forrest Moron, MD Primary Care at [place], [facility]

## 2019-03-03 DIAGNOSIS — M9903 Segmental and somatic dysfunction of lumbar region: Secondary | ICD-10-CM | POA: Diagnosis not present

## 2019-03-03 DIAGNOSIS — M9902 Segmental and somatic dysfunction of thoracic region: Secondary | ICD-10-CM | POA: Diagnosis not present

## 2019-03-03 DIAGNOSIS — M9905 Segmental and somatic dysfunction of pelvic region: Secondary | ICD-10-CM | POA: Diagnosis not present

## 2019-03-03 DIAGNOSIS — M791 Myalgia, unspecified site: Secondary | ICD-10-CM | POA: Diagnosis not present

## 2019-04-06 DIAGNOSIS — R7303 Prediabetes: Secondary | ICD-10-CM | POA: Diagnosis not present

## 2019-04-06 DIAGNOSIS — E785 Hyperlipidemia, unspecified: Secondary | ICD-10-CM | POA: Diagnosis not present

## 2019-04-06 DIAGNOSIS — I10 Essential (primary) hypertension: Secondary | ICD-10-CM | POA: Diagnosis not present

## 2019-04-13 DIAGNOSIS — I361 Nonrheumatic tricuspid (valve) insufficiency: Secondary | ICD-10-CM | POA: Diagnosis not present

## 2019-04-13 DIAGNOSIS — E785 Hyperlipidemia, unspecified: Secondary | ICD-10-CM | POA: Diagnosis not present

## 2019-04-13 DIAGNOSIS — I10 Essential (primary) hypertension: Secondary | ICD-10-CM | POA: Diagnosis not present

## 2019-04-13 DIAGNOSIS — M1990 Unspecified osteoarthritis, unspecified site: Secondary | ICD-10-CM | POA: Diagnosis not present

## 2019-04-13 DIAGNOSIS — R55 Syncope and collapse: Secondary | ICD-10-CM | POA: Diagnosis not present

## 2019-04-13 DIAGNOSIS — R7303 Prediabetes: Secondary | ICD-10-CM | POA: Diagnosis not present

## 2019-04-13 DIAGNOSIS — I25118 Atherosclerotic heart disease of native coronary artery with other forms of angina pectoris: Secondary | ICD-10-CM | POA: Diagnosis not present

## 2019-05-05 ENCOUNTER — Ambulatory Visit: Payer: Medicare Other | Admitting: Family Medicine

## 2019-05-10 ENCOUNTER — Other Ambulatory Visit: Payer: Self-pay

## 2019-05-10 ENCOUNTER — Encounter: Payer: Self-pay | Admitting: Family Medicine

## 2019-05-10 ENCOUNTER — Ambulatory Visit (INDEPENDENT_AMBULATORY_CARE_PROVIDER_SITE_OTHER): Payer: Medicare Other | Admitting: Family Medicine

## 2019-05-10 VITALS — BP 130/85 | HR 73 | Temp 98.5°F | Ht 63.0 in | Wt 205.2 lb

## 2019-05-10 DIAGNOSIS — K5903 Drug induced constipation: Secondary | ICD-10-CM | POA: Diagnosis not present

## 2019-05-10 DIAGNOSIS — T402X5A Adverse effect of other opioids, initial encounter: Secondary | ICD-10-CM

## 2019-05-10 DIAGNOSIS — I10 Essential (primary) hypertension: Secondary | ICD-10-CM

## 2019-05-10 MED ORDER — LINACLOTIDE 145 MCG PO CAPS: 145.0000 ug | ORAL_CAPSULE | Freq: Every day | ORAL | 1 refills | Status: DC

## 2019-05-10 NOTE — Patient Instructions (Addendum)
If you have lab work done today you will be contacted with your lab results within the next 2 weeks.  If you have not heard from [place] then please contact us. The fastest way to get your results is to register for My Chart.   IF you received an x-ray today, you will receive an invoice from [facility]. Please contact [facility] at [phone] with questions or concerns regarding your invoice.   IF you received labwork today, you will receive an invoice from [facility]. Please contact [facility] at [phone] with questions or concerns regarding your invoice.   Our billing staff will not be able to assist you with questions regarding bills from these companies.  You will be contacted with the lab results as soon as they are available. The fastest way to get your results is to activate your My Chart account. Instructions are located on the last page of this paperwork. If you have not heard from [place] regarding the results in 2 weeks, please contact this office.      Chronic Constipation  Chronic constipation is a condition in which a person has three or fewer bowel movements a week, for three months or longer. This condition is especially common in older adults. The two main kinds of chronic constipation are secondary constipation and functional constipation. Secondary constipation results from another condition or a treatment. Functional constipation, also called primary or idiopathic constipation, is divided into three types:  Normal transit constipation. In this type, movement of stool through the colon (stool transit) occurs normally.  Slow transit constipation. In this type, stool moves slowly through the colon.  Outlet constipation or pelvic floor dysfunction. In this type, the nerves and muscles that empty the rectum do not work normally. What are the causes? Causes of secondary constipation may include:  Failing to drink enough fluid, eat enough food or fiber, or  get physically active.  Pregnancy.  A tear in the anus (anal fissure).  Blockage in the bowel (bowel obstruction).  Narrowing of the bowel (bowel stricture).  Having a long-term medical condition, such as: ? Diabetes. ? Hypothyroidism. ? Multiple sclerosis. ? Parkinson disease. ? Stroke. ? Spinal cord injury. ? Dementia. ? Colon cancer. ? Inflammatory bowel disease (IBD). ? Iron-deficiency anemia. ? Outward collapse of the rectum (rectal prolapse). ? Hemorrhoids.  Taking certain medicines, including: ? Narcotics. These are a certain type of prescription pain medicine. ? Antacids. ? Iron supplements. ? Water pills (diuretics). ? Certain blood pressure medicines. ? Anti-seizure medicines. ? Antidepressants. ? Medicines for Parkinson disease. The cause of functional constipation is not known, but some conditions are associated with it. These conditions include:  Stress.  Problems in the nerves and muscles that control stool transit.  Weak or impaired pelvic floor muscles. What increases the risk? You may be at higher risk for chronic constipation if you:  Are older than age 50.  Are female.  Live in a long-term care facility.  Do not get much exercise or physical activity (have a sedentary lifestyle).  Do not drink enough fluids.  Do not eat enough food, especially fiber.  Have a long-term disease.  Have a mental health disorder or eating disorder.  Take many medicines. What are the signs or symptoms? The main symptom of chronic constipation is having three or fewer bowel movements a week for several weeks. Other signs and symptoms may vary from person to person. These include:  Pushing hard (straining) to pass stool.  Painful bowel  movements.  Having hard or lumpy stools.  Having lower belly discomfort, such as cramps or bloating.  Being unable to have a bowel movement when you feel the urge.  Feeling like you still need to pass stool after a  bowel movement.  Feeling that you have something in your rectum that is blocking or preventing bowel movements.  Seeing blood on the toilet paper or in your stool.  Worsening confusion (in older adults). How is this diagnosed? This condition may be diagnosed based on:  Symptoms and medical history. You will be asked about your symptoms, lifestyle, diet, and any medicines that you are taking.  Physical exam. ? Your belly (abdomen) will be examined. ? A digital rectal exam may be done. For this exam, a health care provider places a lubricated, gloved finger into the rectum.  Other tests to check for any underlying causes of your constipation. These may be ordered if you have bleeding in your rectum, weight loss, or a family history of colon cancer. In these cases, you may have: ? Imaging studies of the colon. These may include X-ray, ultrasound, or CT scan. ? Blood tests. ? A procedure to examine the inside of your colon (colonoscopy). ? More specialized tests to check:  Whether your anal sphincter works well. This is a ring-shaped muscle that controls the closing of the anus.  How well food moves through your colon. ? Tests to measure the nerve signal in your pelvic floor muscles (electromyography). How is this treated? Treatment for chronic constipation depends on the cause. Most often, treatment starts with:  Being more active and getting regular exercise.  Drinking more fluids.  Adding fiber to your diet. Sources of fiber include fruits, vegetables, whole grains, and fiber supplements.  Using medicines such as stool softeners or medicines that increase contractions in your digestive system (pro-motility agents).  Training your pelvic muscles with biofeedback.  Surgery, if there is obstruction. Treatment for secondary chronic constipation depends on the underlying condition. You may need to:  Stop or change some medicines if they cause constipation.  Use a fiber  supplement (bulk laxative) or stool softener.  Use prescription laxative. This works by [employer] into your colon (osmotic laxative). You may also need to see a specialist who treats conditions of the digestive system (gastroenterologist). Follow these instructions at home:   Take over-the-counter and prescription medicines only as told by your health care provider.  If you are taking a laxative, take it as told by your health care provider.  Eat a balanced diet that includes enough fiber. Ask your health care provider to recommend a diet that is right for you.  Drink clear fluids, especially water. Avoid drinking alcohol, caffeine, and soda.  Drink enough fluid to keep your urine pale yellow.  Get some physical activity every day. Ask your health care provider what physical activities are safe for you.  Get colon cancer screenings as told by your health care provider.  Keep all follow-up visits as told by your health care provider. This is important. Contact a health care provider if:  You are having three or fewer bowel movements a week.  Your stools are hard or lumpy.  You notice blood on the toilet paper or in your stool after you have a bowel movement.  You have unexplained weight loss.  You have rectum (rectal) pain.  You have stool leakage.  You experience nausea or vomiting. Get help right away if:  You have rectal bleeding or you  pass blood clots.  You have severe rectal pain.  You have body tissue that pushes out (protrudes) from your anus.  You have severe pain or bloating (distension) in your abdomen.  You have vomiting that you cannot control. Summary  Chronic constipation is a condition in which a person has three or fewer bowel movements a week, for three months or longer.  You may have a higher risk for this condition if you are an older adult, or if you do not drink enough water or get enough physical activity (are sedentary).  Treatment  for this condition depends on the cause. Most treatments for chronic constipation include adding fiber to your diet, drinking more fluids, and getting more physical activity. You may also need to treat any underlying medical conditions or stop or change certain medicines if they cause constipation.  If lifestyle changes do not relieve constipation, your health care provider may recommend taking a laxative. This information is not intended to replace advice given to you by your health care provider. Make sure you discuss any questions you have with your health care provider. Document Revised: 12/13/2016 Document Reviewed: 09/17/2016 Elsevier Patient Education  [place].

## 2019-05-10 NOTE — Progress Notes (Signed)
Established Patient Office Visit  Subjective:  Patient ID: Kathleen Pacheco, female    DOB: 07/01/1935  Age: 84 y.o. MRN: [id number]  CC:  Chief Complaint  Patient presents with  . Hypertension    f/u   . Constipation    going months    HPI Kathleen Pacheco presents for   Hypertension: Patient here for follow-up of elevated blood pressure. She is exercising and is adherent to low salt diet.  Blood pressure is well controlled at home. Cardiac symptoms none. Patient denies chest pain, chest pressure/discomfort, dyspnea, fatigue, irregular heart beat, lower extremity edema and palpitations.  Cardiovascular risk factors: hypertension. Use of agents associated with hypertension: none. History of target organ damage: none. BP Readings from Last 3 Encounters:  05/10/19 130/85  01/13/19 (!) 142/89  11/30/18 (!) 142/76    Opioid Induced  Patient takes Tramadol for chronic pain She takes that she would like to get something  She states that she uses Bean-O and Miralax She reports hat she takes the Tramadol twice a week    Past Medical History:  Diagnosis Date  . Bradycardia   . Gout   . Hx of bladder infections   . Hyperlipidemia   . Hypertension   . Migraine   . Thyroid disease   . Vertigo     Past Surgical History:  Procedure Laterality Date  . ABDOMINAL HYSTERECTOMY    . CHOLECYSTECTOMY    . GALLBLADDER SURGERY    . PARATHYROIDECTOMY      No family history on file.  Social History   Socioeconomic History  . Marital status: Divorced    Spouse name: Not on file  . Number of children: 3  . Years of education: Not on file  . Highest education level: Not on file  Occupational History  . Not on file  Tobacco Use  . Smoking status: Never Smoker  . Smokeless tobacco: Never Used  Substance and Sexual Activity  . Alcohol use: Yes    Alcohol/week: 1.0 standard drinks    Types: 1 Glasses of wine per week  . Drug use: No  . Sexual activity: Not Currently    Birth  control/protection: Surgical    Comment: HYST   Other Topics Concern  . Not on file  Social History Narrative   Diet?      Do you drink/eat things with caffeine? Cup of macho- 1/week      Marital status?                divorced                    What year were you married? 1964      Do you live in a house, apartment, assisted living, condo, trailer, etc.? house      Is it one or more stories? one      How many persons live in your home? 1       Do you have any pets in your home? (please list) no      Current or past profession: Retired Pharmacist, hospital       Do you exercise? Work sometimes                                     Type & how often?      Do you have a living will? no  Do you have a DNR form?   no                               If not, do you want to discuss one? no      Do you have signed POA/HPOA for forms?    Social Determinants of Health   Financial Resource Strain:   . Difficulty of Paying Living Expenses:   Food Insecurity:   . Worried About Charity fundraiser in the Last Year:   . Arboriculturist in the Last Year:   Transportation Needs:   . Film/video editor (Medical):   Marland Kitchen Lack of Transportation (Non-Medical):   Physical Activity:   . Days of Exercise per Week:   . Minutes of Exercise per Session:   Stress:   . Feeling of Stress :   Social Connections:   . Frequency of Communication with Friends and Family:   . Frequency of Social Gatherings with Friends and Family:   . Attends Religious Services:   . Active Member of Clubs or Organizations:   . Attends Archivist Meetings:   Marland Kitchen Marital Status:   Intimate Partner Violence:   . Fear of Current or Ex-Partner:   . Emotionally Abused:   Marland Kitchen Physically Abused:   . Sexually Abused:     Outpatient Medications Prior to Visit  Medication Sig Dispense Refill  . albuterol (VENTOLIN HFA) 108 (90 Base) MCG/ACT inhaler INHALE 2 PUFFS INTO THE LUNGS EVERY 4 HOURS AS NEEDED FOR WHEEZING, SHORTNESS  OF BREATH, OR COUGH 18 g 1  . Alpha-D-Galactosidase (BEANO) TABS Take 2 tablets by mouth every 6 (six) hours as needed (for gas).    Marland Kitchen amLODipine (NORVASC) 5 MG tablet Take 5 mg by mouth daily.    Marland Kitchen aspirin EC 81 MG tablet Take 81 mg by mouth daily.    Marland Kitchen atorvastatin (LIPITOR) 40 MG tablet Take 40 mg by mouth daily.    . B Complex-C (B-COMPLEX WITH VITAMIN C) tablet Take 1 tablet by mouth daily.    . beclomethasone (QVAR REDIHALER) 80 MCG/ACT inhaler Inhale 2 puffs in the morning and 1 puff in the evening. 10.6 g 2  . cetirizine (ZYRTEC) 10 MG tablet Take 1 tablet (10 mg total) by mouth daily as needed for allergies. 90 tablet 3  . ezetimibe (ZETIA) 10 MG tablet ezetimibe 10 mg tablet    . fluticasone (FLONASE) 50 MCG/ACT nasal spray Place 2 sprays into both nostrils daily. 16 g 6  . hydrochlorothiazide (HYDRODIURIL) 25 MG tablet Take 25 mg by mouth daily.    . Multiple Vitamins-Minerals (MULTIVITAMIN GUMMIES ADULT) CHEW Chew 3 each by mouth daily.    . potassium chloride SA (K-DUR,KLOR-CON) 20 MEQ tablet Take 20 mEq by mouth daily.    Marland Kitchen triamcinolone cream (KENALOG) 0.1 % APPLY TO AFFECTED AREA TWICE A DAY 30 g 0  . TURMERIC PO Take 5 mLs by mouth daily.    . meloxicam (MOBIC) 15 MG tablet Take 15 mg by mouth daily.    . montelukast (SINGULAIR) 10 MG tablet TAKE 1 TABLET BY MOUTH EVERYDAY AT BEDTIME (Patient not taking: Reported on 05/10/2019) 90 tablet 0  . traMADol (ULTRAM) 50 MG tablet Take 50 mg by mouth every 6 (six) hours as needed for moderate pain.     No facility-administered medications prior to visit.    Allergies  Allergen Reactions  . Ace Inhibitors  Other (See Comments)    Reaction:  Unknown   . Aspartame   . Other Swelling and Other (See Comments)    Pt states that she is allergic to artificial sweeteners.   Reaction:  Facial swelling   . Penicillins Other (See Comments)    Reaction:  Yeast infection  Has patient had a PCN reaction causing immediate rash,  facial/tongue/throat swelling, SOB or lightheadedness with hypotension: No Has patient had a PCN reaction causing severe rash involving mucus membranes or skin necrosis: No Has patient had a PCN reaction that required hospitalization No Has patient had a PCN reaction occurring within the last 10 years: No If all of the above answers are "NO", then may proceed with Cephalosporin use.    ROS Review of Systems Review of Systems  Constitutional: Negative for activity change, appetite change, chills and fever.  HENT: Negative for congestion, nosebleeds, trouble swallowing and voice change.   Respiratory: Negative for cough, shortness of breath and wheezing.   Gastrointestinal: Negative for diarrhea, nausea and vomiting.  Genitourinary: Negative for difficulty urinating, dysuria, flank pain and hematuria.  Musculoskeletal: Negative for back pain, joint swelling and neck pain.  Neurological: Negative for dizziness, speech difficulty, light-headedness and numbness.  See HPI. All other review of systems negative.     Objective:    Physical Exam  BP 130/85   Pulse 73   Temp 98.5 F (36.9 C) (Temporal)   Ht 5\' 3"  (1.6 m)   Wt 205 lb 3.2 oz (93.1 kg)   SpO2 96%   BMI 36.35 kg/m  Wt Readings from Last 3 Encounters:  05/10/19 205 lb 3.2 oz (93.1 kg)  01/13/19 207 lb 3.2 oz (94 kg)  11/30/18 201 lb 9.6 oz (91.4 kg)   Physical Exam  Constitutional: Oriented to person, place, and time. Appears well-developed and well-nourished. Walks with cane HENT:  Head: Normocephalic and atraumatic.  Eyes: Conjunctivae and EOM are normal.  Cardiovascular: Normal rate, regular rhythm, normal heart sounds and intact distal pulses.  No murmur heard. Pulmonary/Chest: Effort normal and breath sounds normal. No stridor. No respiratory distress. Has no wheezes.  Neurological: Is alert and oriented to person, place, and time.  Skin: Skin is warm. Capillary refill takes less than 2 seconds.  Psychiatric: Has  a normal mood and affect. Behavior is normal. Judgment and thought content normal.    Health Maintenance Due  Topic Date Due  . COVID-19 Vaccine (1) Never done  . TETANUS/TDAP  Never done    There are no preventive care reminders to display for this patient.  Lab Results  Component Value Date   TSH 1.690 10/15/2016   Lab Results  Component Value Date   WBC 5.3 05/09/2017   HGB 13.4 05/09/2017   HCT 42.1 05/09/2017   MCV 81.8 05/09/2017   PLT 235 11/14/2016   Lab Results  Component Value Date   NA 137 10/12/2018   K 4.0 10/12/2018   CO2 23 10/12/2018   GLUCOSE 99 10/12/2018   BUN 9 10/12/2018   CREATININE 1.00 10/12/2018   BILITOT 0.4 10/12/2018   ALKPHOS 101 10/12/2018   AST 19 10/12/2018   ALT 23 10/12/2018   PROT 7.2 10/12/2018   ALBUMIN 4.3 10/12/2018   CALCIUM 10.1 10/12/2018   ANIONGAP 8 04/30/2016   GFR 80.23 10/22/2013   Lab Results  Component Value Date   CHOL 163 10/12/2018   Lab Results  Component Value Date   HDL 47 10/12/2018   Lab Results  Component Value Date   LDLCALC 76 10/12/2018   Lab Results  Component Value Date   TRIG 246 (H) 10/12/2018   Lab Results  Component Value Date   CHOLHDL 3.5 10/12/2018   Lab Results  Component Value Date   HGBA1C 6.0 (H) 10/12/2018      Assessment & Plan:   Problem List Items Addressed This Visit    None    Visit Diagnoses    Therapeutic opioid induced constipation    -  Primary   Essential hypertension, benign        HYPERTENSION  -  Patient's blood pressure is at goal of 139/89 or less. Condition is stable. Continue current medications and treatment plan. I recommend that you exercise for 30-45 minutes 5 days a week. I also recommend a balanced diet with fruits and vegetables every day, lean meats, and little fried foods. The DASH diet (you can find this online) is a good example of this. -   Patient already  Had labs done at Cardiology   CONSTIPATION Will try to do manage  constipation with linzess Will seek prior-authorization if necessary If linzess is not authorized then consider taking a stool softener with tramadol as well as mineral oil   Meds ordered this encounter  Medications  . linaclotide (LINZESS) 145 MCG CAPS capsule    Sig: Take 1 capsule (145 mcg total) by mouth daily before breakfast.    Dispense:  30 capsule    Refill:  1    Follow-up: No follow-ups on file.    Forrest Moron, MD

## 2019-05-25 DIAGNOSIS — H838X3 Other specified diseases of inner ear, bilateral: Secondary | ICD-10-CM | POA: Diagnosis not present

## 2019-05-25 DIAGNOSIS — H608X3 Other otitis externa, bilateral: Secondary | ICD-10-CM | POA: Diagnosis not present

## 2019-05-25 DIAGNOSIS — H6123 Impacted cerumen, bilateral: Secondary | ICD-10-CM | POA: Diagnosis not present

## 2019-05-25 DIAGNOSIS — H903 Sensorineural hearing loss, bilateral: Secondary | ICD-10-CM | POA: Diagnosis not present

## 2019-05-31 ENCOUNTER — Telehealth (INDEPENDENT_AMBULATORY_CARE_PROVIDER_SITE_OTHER): Payer: Medicare Other | Admitting: Registered Nurse

## 2019-05-31 ENCOUNTER — Ambulatory Visit: Payer: Medicare Other | Admitting: Family Medicine

## 2019-05-31 ENCOUNTER — Other Ambulatory Visit: Payer: Self-pay

## 2019-05-31 DIAGNOSIS — J0110 Acute frontal sinusitis, unspecified: Secondary | ICD-10-CM | POA: Diagnosis not present

## 2019-05-31 DIAGNOSIS — Z760 Encounter for issue of repeat prescription: Secondary | ICD-10-CM | POA: Diagnosis not present

## 2019-05-31 MED ORDER — BENZONATATE 100 MG PO CAPS: 100.0000 mg | ORAL_CAPSULE | Freq: Two times a day (BID) | ORAL | 0 refills | Status: AC | PRN

## 2019-05-31 MED ORDER — AZITHROMYCIN 250 MG PO TABS: ORAL_TABLET | ORAL | 0 refills | Status: AC

## 2019-05-31 MED ORDER — FLUTICASONE PROPIONATE 50 MCG/ACT NA SUSP: 2.0000 | Freq: Every day | NASAL | 6 refills | Status: AC

## 2019-05-31 NOTE — Patient Instructions (Signed)
° ° ° °  If you have lab work done today you will be contacted with your lab results within the next 2 weeks.  If you have not heard from us then please contact us. The fastest way to get your results is to register for My Chart. ° ° °IF you received an x-ray today, you will receive an invoice from [facility]. Please contact [facility] at [phone] with questions or concerns regarding your invoice.  ° °IF you received labwork today, you will receive an invoice from [facility]. Please contact [facility] at [phone] with questions or concerns regarding your invoice.  ° °Our billing staff will not be able to assist you with questions regarding bills from these companies. ° °You will be contacted with the lab results as soon as they are available. The fastest way to get your results is to activate your My Chart account. Instructions are located on the last page of this paperwork. If you have not heard from us regarding the results in 2 weeks, please contact this office. °  ° ° ° °

## 2019-05-31 NOTE — Progress Notes (Signed)
Telemedicine Encounter- SOAP NOTE Established Patient  This telephone encounter was conducted with the patient's (or proxy's) verbal consent via audio telecommunications: yes  Patient was instructed to have this encounter in a suitably private space; and to only have persons present to whom they give permission to participate. In addition, patient identity was confirmed by use of name plus two identifiers (DOB and address).  I discussed the limitations, risks, security and privacy concerns of performing an evaluation and management service by telephone and the availability of in person appointments. I also discussed with the patient that there may be a patient responsible charge related to this service. The patient expressed understanding and agreed to proceed.  I spent a total of 13 minutes talking with the patient or their proxy.  Chief Complaint  Patient presents with  . cough/ headache/ congestion    started last night , denies any exposure to covid, has received covid vaccines- dates unknown     Subjective   Kathleen Pacheco is a 84 y.o. established patient. Telephone visit today for cough  HPI Onset last night Frequent dry cough No nasal or sinus congestion of note No fevers, chills, fatigue, nvd, chest pain, visual changes, or other concerns No sick contacts. Has already been vaccinated against COVID. Some hx of seasonal allergies.   Patient Active Problem List   Diagnosis Date Noted  . Acute bronchitis 03/18/2016  . Osteoarthrosis, hand 10/22/2013  . DIZZINESS 02/02/2009  . HYPERCHOLESTEROLEMIA 01/25/2009  . HYPERTENSION 01/25/2009    Past Medical History:  Diagnosis Date  . Bradycardia   . Gout   . Hx of bladder infections   . Hyperlipidemia   . Hypertension   . Migraine   . Thyroid disease   . Vertigo     Current Outpatient Medications  Medication Sig Dispense Refill  . albuterol (VENTOLIN HFA) 108 (90 Base) MCG/ACT inhaler INHALE 2 PUFFS INTO THE LUNGS  EVERY 4 HOURS AS NEEDED FOR WHEEZING, SHORTNESS OF BREATH, OR COUGH 18 g 1  . Alpha-D-Galactosidase (BEANO) TABS Take 2 tablets by mouth every 6 (six) hours as needed (for gas).    Marland Kitchen amLODipine (NORVASC) 5 MG tablet Take 5 mg by mouth daily.    Marland Kitchen aspirin EC 81 MG tablet Take 81 mg by mouth daily.    Marland Kitchen atorvastatin (LIPITOR) 40 MG tablet Take 40 mg by mouth daily.    . B Complex-C (B-COMPLEX WITH VITAMIN C) tablet Take 1 tablet by mouth daily.    . beclomethasone (QVAR REDIHALER) 80 MCG/ACT inhaler Inhale 2 puffs in the morning and 1 puff in the evening. 10.6 g 2  . cetirizine (ZYRTEC) 10 MG tablet Take 1 tablet (10 mg total) by mouth daily as needed for allergies. 90 tablet 3  . ezetimibe (ZETIA) 10 MG tablet ezetimibe 10 mg tablet    . hydrochlorothiazide (HYDRODIURIL) 25 MG tablet Take 25 mg by mouth daily.    Marland Kitchen linaclotide (LINZESS) 145 MCG CAPS capsule Take 1 capsule (145 mcg total) by mouth daily before breakfast. 30 capsule 1  . meloxicam (MOBIC) 15 MG tablet Take 15 mg by mouth daily.    . montelukast (SINGULAIR) 10 MG tablet TAKE 1 TABLET BY MOUTH EVERYDAY AT BEDTIME 90 tablet 0  . Multiple Vitamins-Minerals (MULTIVITAMIN GUMMIES ADULT) CHEW Chew 3 each by mouth daily.    . potassium chloride SA (K-DUR,KLOR-CON) 20 MEQ tablet Take 20 mEq by mouth daily.    . traMADol (ULTRAM) 50 MG tablet Take 50  mg by mouth every 6 (six) hours as needed for moderate pain.    Marland Kitchen triamcinolone cream (KENALOG) 0.1 % APPLY TO AFFECTED AREA TWICE A DAY 30 g 0  . TURMERIC PO Take 5 mLs by mouth daily.    Marland Kitchen azithromycin (ZITHROMAX) 250 MG tablet Take 2 tablets on first day, then take 1 daily. Finish entire supply. 6 tablet 0  . benzonatate (TESSALON) 100 MG capsule Take 1 capsule (100 mg total) by mouth 2 (two) times daily as needed for cough. 20 capsule 0  . fluticasone (FLONASE) 50 MCG/ACT nasal spray Place 2 sprays into both nostrils daily. 16 g 6   No current facility-administered medications for this  visit.    Allergies  Allergen Reactions  . Ace Inhibitors Other (See Comments)    Reaction:  Unknown   . Aspartame   . Other Swelling and Other (See Comments)    Pt states that she is allergic to artificial sweeteners.   Reaction:  Facial swelling   . Penicillins Other (See Comments)    Reaction:  Yeast infection  Has patient had a PCN reaction causing immediate rash, facial/tongue/throat swelling, SOB or lightheadedness with hypotension: No Has patient had a PCN reaction causing severe rash involving mucus membranes or skin necrosis: No Has patient had a PCN reaction that required hospitalization No Has patient had a PCN reaction occurring within the last 10 years: No If all of the above answers are "NO", then may proceed with Cephalosporin use.    Social History   Socioeconomic History  . Marital status: Divorced    Spouse name: Not on file  . Number of children: 3  . Years of education: Not on file  . Highest education level: Not on file  Occupational History  . Not on file  Tobacco Use  . Smoking status: Never Smoker  . Smokeless tobacco: Never Used  Substance and Sexual Activity  . Alcohol use: Yes    Alcohol/week: 1.0 standard drinks    Types: 1 Glasses of wine per week  . Drug use: No  . Sexual activity: Not Currently    Birth control/protection: Surgical    Comment: HYST   Other Topics Concern  . Not on file  Social History Narrative   Diet?      Do you drink/eat things with caffeine? Cup of macho- 1/week      Marital status?                divorced                    What year were you married? 1964      Do you live in a house, apartment, assisted living, condo, trailer, etc.? house      Is it one or more stories? one      How many persons live in your home? 1       Do you have any pets in your home? (please list) no      Current or past profession: Retired Pharmacist, hospital       Do you exercise? Work sometimes                                     Type & how  often?      Do you have a living will? no      Do you have a DNR form?  no                               If not, do you want to discuss one? no      Do you have signed POA/HPOA for forms?    Social Determinants of Health   Financial Resource Strain:   . Difficulty of Paying Living Expenses:   Food Insecurity:   . Worried About Charity fundraiser in the Last Year:   . Arboriculturist in the Last Year:   Transportation Needs:   . Film/video editor (Medical):   Marland Kitchen Lack of Transportation (Non-Medical):   Physical Activity:   . Days of Exercise per Week:   . Minutes of Exercise per Session:   Stress:   . Feeling of Stress :   Social Connections:   . Frequency of Communication with Friends and Family:   . Frequency of Social Gatherings with Friends and Family:   . Attends Religious Services:   . Active Member of Clubs or Organizations:   . Attends Archivist Meetings:   Marland Kitchen Marital Status:   Intimate Partner Violence:   . Fear of Current or Ex-Partner:   . Emotionally Abused:   Marland Kitchen Physically Abused:   . Sexually Abused:     Review of Systems  Constitutional: Negative.   HENT: Negative.   Eyes: Negative.   Respiratory: Positive for cough. Negative for hemoptysis, sputum production, shortness of breath and wheezing.   Cardiovascular: Negative.   Gastrointestinal: Negative.   Genitourinary: Negative.   Musculoskeletal: Negative.   Skin: Negative.   Neurological: Negative.   Endo/Heme/Allergies: Negative.   Psychiatric/Behavioral: Negative.   All other systems reviewed and are negative.   Objective   Vitals as reported by the patient: There were no vitals filed for this visit.  Jniya was seen today for cough/ headache/ congestion.  Diagnoses and all orders for this visit:  Acute non-recurrent frontal sinusitis -     fluticasone (FLONASE) 50 MCG/ACT nasal spray; Place 2 sprays into both nostrils daily. -     azithromycin (ZITHROMAX) 250 MG tablet;  Take 2 tablets on first day, then take 1 daily. Finish entire supply. -     benzonatate (TESSALON) 100 MG capsule; Take 1 capsule (100 mg total) by mouth 2 (two) times daily as needed for cough.  Encounter for medication refill -     fluticasone (FLONASE) 50 MCG/ACT nasal spray; Place 2 sprays into both nostrils daily.   PLAN  Azithromycin for suspected respiratory infection. Given age and history, prompt antibiotic use may be ideal  flonase and tessalon for symptom relief  Return and hospital precautions given  Patient encouraged to call clinic with any questions, comments, or concerns.  I discussed the assessment and treatment plan with the patient. The patient was provided an opportunity to ask questions and all were answered. The patient agreed with the plan and demonstrated an understanding of the instructions.   The patient was advised to call back or seek an in-person evaluation if the symptoms worsen or if the condition fails to improve as anticipated.  I provided 13 minutes of non-face-to-face time during this encounter.  Maximiano Coss, NP  Primary [facility] at [facility]

## 2019-06-01 ENCOUNTER — Telehealth: Payer: Self-pay | Admitting: Registered Nurse

## 2019-06-01 NOTE — Telephone Encounter (Signed)
Pt had a virtual with [facility] yesterday. Pt needs a different medicine for cough.pt states that what provider  gave her yesterday is not working, cough is too harsh

## 2019-06-01 NOTE — Telephone Encounter (Signed)
Please Advise .  Patient feels like medication is not working states the cough is too harsh.

## 2019-06-03 NOTE — Telephone Encounter (Signed)
Please call pt / patient is really upset because she needs something for her cough. Cough is too dry and would like something else.she needs a stronger cough medicine . Wants something with Codeine in it . The only thing that works for her .   Wants someone to look into her records

## 2019-06-03 NOTE — Telephone Encounter (Signed)
Set message to provider to ask for stronger med for pt. Patient feels the medication is nit working

## 2019-06-04 ENCOUNTER — Other Ambulatory Visit: Payer: Self-pay | Admitting: Registered Nurse

## 2019-06-04 DIAGNOSIS — R059 Cough, unspecified: Secondary | ICD-10-CM

## 2019-06-04 MED ORDER — FLUTICASONE-SALMETEROL 250-50 MCG/DOSE IN AEPB: 1.0000 | INHALATION_SPRAY | Freq: Two times a day (BID) | RESPIRATORY_TRACT | 3 refills | Status: DC

## 2019-06-08 DIAGNOSIS — M7661 Achilles tendinitis, right leg: Secondary | ICD-10-CM | POA: Diagnosis not present

## 2019-07-29 DIAGNOSIS — R102 Pelvic and perineal pain: Secondary | ICD-10-CM | POA: Diagnosis not present

## 2019-07-29 DIAGNOSIS — R05 Cough: Secondary | ICD-10-CM | POA: Diagnosis not present

## 2019-08-06 DIAGNOSIS — M25551 Pain in right hip: Secondary | ICD-10-CM | POA: Diagnosis not present

## 2019-08-06 DIAGNOSIS — M1611 Unilateral primary osteoarthritis, right hip: Secondary | ICD-10-CM | POA: Diagnosis not present

## 2019-08-20 DIAGNOSIS — M25552 Pain in left hip: Secondary | ICD-10-CM | POA: Diagnosis not present

## 2019-08-20 DIAGNOSIS — M7062 Trochanteric bursitis, left hip: Secondary | ICD-10-CM | POA: Diagnosis not present

## 2019-08-26 DIAGNOSIS — M7062 Trochanteric bursitis, left hip: Secondary | ICD-10-CM | POA: Diagnosis not present

## 2019-08-26 DIAGNOSIS — M25652 Stiffness of left hip, not elsewhere classified: Secondary | ICD-10-CM | POA: Diagnosis not present

## 2019-08-26 DIAGNOSIS — M1611 Unilateral primary osteoarthritis, right hip: Secondary | ICD-10-CM | POA: Diagnosis not present

## 2019-08-26 DIAGNOSIS — M25651 Stiffness of right hip, not elsewhere classified: Secondary | ICD-10-CM | POA: Diagnosis not present

## 2019-09-01 DIAGNOSIS — M7062 Trochanteric bursitis, left hip: Secondary | ICD-10-CM | POA: Diagnosis not present

## 2019-09-01 DIAGNOSIS — M1611 Unilateral primary osteoarthritis, right hip: Secondary | ICD-10-CM | POA: Diagnosis not present

## 2019-09-01 DIAGNOSIS — M25652 Stiffness of left hip, not elsewhere classified: Secondary | ICD-10-CM | POA: Diagnosis not present

## 2019-09-01 DIAGNOSIS — M25651 Stiffness of right hip, not elsewhere classified: Secondary | ICD-10-CM | POA: Diagnosis not present

## 2019-09-03 DIAGNOSIS — M25652 Stiffness of left hip, not elsewhere classified: Secondary | ICD-10-CM | POA: Diagnosis not present

## 2019-09-03 DIAGNOSIS — M25651 Stiffness of right hip, not elsewhere classified: Secondary | ICD-10-CM | POA: Diagnosis not present

## 2019-09-03 DIAGNOSIS — M1611 Unilateral primary osteoarthritis, right hip: Secondary | ICD-10-CM | POA: Diagnosis not present

## 2019-09-03 DIAGNOSIS — M7062 Trochanteric bursitis, left hip: Secondary | ICD-10-CM | POA: Diagnosis not present

## 2019-09-06 DIAGNOSIS — M25651 Stiffness of right hip, not elsewhere classified: Secondary | ICD-10-CM | POA: Diagnosis not present

## 2019-09-06 DIAGNOSIS — M1611 Unilateral primary osteoarthritis, right hip: Secondary | ICD-10-CM | POA: Diagnosis not present

## 2019-09-06 DIAGNOSIS — M25652 Stiffness of left hip, not elsewhere classified: Secondary | ICD-10-CM | POA: Diagnosis not present

## 2019-09-06 DIAGNOSIS — M7062 Trochanteric bursitis, left hip: Secondary | ICD-10-CM | POA: Diagnosis not present

## 2019-09-13 DIAGNOSIS — M25652 Stiffness of left hip, not elsewhere classified: Secondary | ICD-10-CM | POA: Diagnosis not present

## 2019-09-13 DIAGNOSIS — M25651 Stiffness of right hip, not elsewhere classified: Secondary | ICD-10-CM | POA: Diagnosis not present

## 2019-09-13 DIAGNOSIS — M1611 Unilateral primary osteoarthritis, right hip: Secondary | ICD-10-CM | POA: Diagnosis not present

## 2019-09-13 DIAGNOSIS — M7062 Trochanteric bursitis, left hip: Secondary | ICD-10-CM | POA: Diagnosis not present

## 2019-09-15 DIAGNOSIS — M25652 Stiffness of left hip, not elsewhere classified: Secondary | ICD-10-CM | POA: Diagnosis not present

## 2019-09-15 DIAGNOSIS — M1611 Unilateral primary osteoarthritis, right hip: Secondary | ICD-10-CM | POA: Diagnosis not present

## 2019-09-15 DIAGNOSIS — M25651 Stiffness of right hip, not elsewhere classified: Secondary | ICD-10-CM | POA: Diagnosis not present

## 2019-09-15 DIAGNOSIS — M7062 Trochanteric bursitis, left hip: Secondary | ICD-10-CM | POA: Diagnosis not present

## 2019-09-22 DIAGNOSIS — M7062 Trochanteric bursitis, left hip: Secondary | ICD-10-CM | POA: Diagnosis not present

## 2019-09-22 DIAGNOSIS — M1611 Unilateral primary osteoarthritis, right hip: Secondary | ICD-10-CM | POA: Diagnosis not present

## 2019-09-22 DIAGNOSIS — M25652 Stiffness of left hip, not elsewhere classified: Secondary | ICD-10-CM | POA: Diagnosis not present

## 2019-09-22 DIAGNOSIS — M25651 Stiffness of right hip, not elsewhere classified: Secondary | ICD-10-CM | POA: Diagnosis not present

## 2019-09-27 DIAGNOSIS — M1611 Unilateral primary osteoarthritis, right hip: Secondary | ICD-10-CM | POA: Diagnosis not present

## 2019-09-29 ENCOUNTER — Other Ambulatory Visit: Payer: Self-pay | Admitting: Registered Nurse

## 2019-09-29 DIAGNOSIS — R059 Cough, unspecified: Secondary | ICD-10-CM

## 2019-10-12 DIAGNOSIS — Z23 Encounter for immunization: Secondary | ICD-10-CM | POA: Diagnosis not present

## 2019-10-20 DIAGNOSIS — I361 Nonrheumatic tricuspid (valve) insufficiency: Secondary | ICD-10-CM | POA: Diagnosis not present

## 2019-10-20 DIAGNOSIS — I25118 Atherosclerotic heart disease of native coronary artery with other forms of angina pectoris: Secondary | ICD-10-CM | POA: Diagnosis not present

## 2019-10-20 DIAGNOSIS — E785 Hyperlipidemia, unspecified: Secondary | ICD-10-CM | POA: Diagnosis not present

## 2019-10-20 DIAGNOSIS — R7303 Prediabetes: Secondary | ICD-10-CM | POA: Diagnosis not present

## 2019-10-20 DIAGNOSIS — I10 Essential (primary) hypertension: Secondary | ICD-10-CM | POA: Diagnosis not present

## 2019-10-29 DIAGNOSIS — R309 Painful micturition, unspecified: Secondary | ICD-10-CM | POA: Diagnosis not present

## 2019-11-15 DIAGNOSIS — E669 Obesity, unspecified: Secondary | ICD-10-CM | POA: Diagnosis not present

## 2019-11-15 DIAGNOSIS — Z01419 Encounter for gynecological examination (general) (routine) without abnormal findings: Secondary | ICD-10-CM | POA: Diagnosis not present

## 2019-11-15 DIAGNOSIS — E559 Vitamin D deficiency, unspecified: Secondary | ICD-10-CM | POA: Diagnosis not present

## 2019-11-15 DIAGNOSIS — Z139 Encounter for screening, unspecified: Secondary | ICD-10-CM | POA: Diagnosis not present

## 2019-11-15 DIAGNOSIS — M8580 Other specified disorders of bone density and structure, unspecified site: Secondary | ICD-10-CM | POA: Diagnosis not present

## 2019-11-15 DIAGNOSIS — Z1231 Encounter for screening mammogram for malignant neoplasm of breast: Secondary | ICD-10-CM | POA: Diagnosis not present

## 2019-11-15 DIAGNOSIS — R5383 Other fatigue: Secondary | ICD-10-CM | POA: Diagnosis not present

## 2019-11-15 DIAGNOSIS — I10 Essential (primary) hypertension: Secondary | ICD-10-CM | POA: Diagnosis not present

## 2019-11-15 DIAGNOSIS — M8589 Other specified disorders of bone density and structure, multiple sites: Secondary | ICD-10-CM | POA: Diagnosis not present

## 2019-11-25 DIAGNOSIS — Z7182 Exercise counseling: Secondary | ICD-10-CM | POA: Diagnosis not present

## 2019-11-25 DIAGNOSIS — R413 Other amnesia: Secondary | ICD-10-CM | POA: Diagnosis not present

## 2019-11-25 DIAGNOSIS — Z79899 Other long term (current) drug therapy: Secondary | ICD-10-CM | POA: Diagnosis not present

## 2019-11-25 DIAGNOSIS — I10 Essential (primary) hypertension: Secondary | ICD-10-CM | POA: Diagnosis not present

## 2019-11-25 DIAGNOSIS — L853 Xerosis cutis: Secondary | ICD-10-CM | POA: Diagnosis not present

## 2020-01-19 DIAGNOSIS — R928 Other abnormal and inconclusive findings on diagnostic imaging of breast: Secondary | ICD-10-CM | POA: Diagnosis not present

## 2020-01-28 DIAGNOSIS — M9902 Segmental and somatic dysfunction of thoracic region: Secondary | ICD-10-CM | POA: Diagnosis not present

## 2020-01-28 DIAGNOSIS — M9903 Segmental and somatic dysfunction of lumbar region: Secondary | ICD-10-CM | POA: Diagnosis not present

## 2020-01-28 DIAGNOSIS — M791 Myalgia, unspecified site: Secondary | ICD-10-CM | POA: Diagnosis not present

## 2020-01-28 DIAGNOSIS — M9905 Segmental and somatic dysfunction of pelvic region: Secondary | ICD-10-CM | POA: Diagnosis not present

## 2020-02-10 DIAGNOSIS — Z23 Encounter for immunization: Secondary | ICD-10-CM | POA: Diagnosis not present

## 2020-03-15 DIAGNOSIS — M9905 Segmental and somatic dysfunction of pelvic region: Secondary | ICD-10-CM | POA: Diagnosis not present

## 2020-03-15 DIAGNOSIS — M9903 Segmental and somatic dysfunction of lumbar region: Secondary | ICD-10-CM | POA: Diagnosis not present

## 2020-03-15 DIAGNOSIS — M9902 Segmental and somatic dysfunction of thoracic region: Secondary | ICD-10-CM | POA: Diagnosis not present

## 2020-03-15 DIAGNOSIS — M791 Myalgia, unspecified site: Secondary | ICD-10-CM | POA: Diagnosis not present

## 2020-03-20 DIAGNOSIS — H524 Presbyopia: Secondary | ICD-10-CM | POA: Diagnosis not present

## 2020-03-20 DIAGNOSIS — Z961 Presence of intraocular lens: Secondary | ICD-10-CM | POA: Diagnosis not present

## 2020-03-20 DIAGNOSIS — H04123 Dry eye syndrome of bilateral lacrimal glands: Secondary | ICD-10-CM | POA: Diagnosis not present

## 2020-04-12 DIAGNOSIS — R42 Dizziness and giddiness: Secondary | ICD-10-CM | POA: Diagnosis not present

## 2020-04-12 DIAGNOSIS — Z1331 Encounter for screening for depression: Secondary | ICD-10-CM | POA: Diagnosis not present

## 2020-04-12 DIAGNOSIS — G4700 Insomnia, unspecified: Secondary | ICD-10-CM | POA: Diagnosis not present

## 2020-04-14 DIAGNOSIS — S46912A Strain of unspecified muscle, fascia and tendon at shoulder and upper arm level, left arm, initial encounter: Secondary | ICD-10-CM | POA: Diagnosis not present

## 2020-04-14 DIAGNOSIS — M79662 Pain in left lower leg: Secondary | ICD-10-CM | POA: Diagnosis not present

## 2020-04-14 DIAGNOSIS — S8012XA Contusion of left lower leg, initial encounter: Secondary | ICD-10-CM | POA: Diagnosis not present

## 2020-04-14 DIAGNOSIS — M25512 Pain in left shoulder: Secondary | ICD-10-CM | POA: Diagnosis not present

## 2020-04-18 DIAGNOSIS — H04123 Dry eye syndrome of bilateral lacrimal glands: Secondary | ICD-10-CM | POA: Diagnosis not present

## 2020-05-02 DIAGNOSIS — I25118 Atherosclerotic heart disease of native coronary artery with other forms of angina pectoris: Secondary | ICD-10-CM | POA: Diagnosis not present

## 2020-05-02 DIAGNOSIS — R7303 Prediabetes: Secondary | ICD-10-CM | POA: Diagnosis not present

## 2020-05-02 DIAGNOSIS — I10 Essential (primary) hypertension: Secondary | ICD-10-CM | POA: Diagnosis not present

## 2020-05-02 DIAGNOSIS — E785 Hyperlipidemia, unspecified: Secondary | ICD-10-CM | POA: Diagnosis not present

## 2020-05-02 DIAGNOSIS — R55 Syncope and collapse: Secondary | ICD-10-CM | POA: Diagnosis not present

## 2020-05-16 DIAGNOSIS — M791 Myalgia, unspecified site: Secondary | ICD-10-CM | POA: Diagnosis not present

## 2020-05-16 DIAGNOSIS — M9903 Segmental and somatic dysfunction of lumbar region: Secondary | ICD-10-CM | POA: Diagnosis not present

## 2020-05-16 DIAGNOSIS — M9905 Segmental and somatic dysfunction of pelvic region: Secondary | ICD-10-CM | POA: Diagnosis not present

## 2020-05-16 DIAGNOSIS — M9902 Segmental and somatic dysfunction of thoracic region: Secondary | ICD-10-CM | POA: Diagnosis not present

## 2020-05-23 ENCOUNTER — Other Ambulatory Visit: Payer: Self-pay | Admitting: Internal Medicine

## 2020-05-23 DIAGNOSIS — R109 Unspecified abdominal pain: Secondary | ICD-10-CM

## 2020-05-26 DIAGNOSIS — I10 Essential (primary) hypertension: Secondary | ICD-10-CM | POA: Diagnosis not present

## 2020-06-05 ENCOUNTER — Ambulatory Visit
Admission: RE | Admit: 2020-06-05 | Discharge: 2020-06-05 | Disposition: A | Discharge status: Home / Self Care | Payer: Medicare Other | Source: Ambulatory Visit | Attending: Internal Medicine | Admitting: Internal Medicine

## 2020-06-05 DIAGNOSIS — R109 Unspecified abdominal pain: Secondary | ICD-10-CM

## 2020-06-30 ENCOUNTER — Other Ambulatory Visit (HOSPITAL_COMMUNITY)
Admission: RE | Admit: 2020-06-30 | Discharge: 2020-06-30 | Disposition: A | Discharge status: Home / Self Care | Payer: Medicare Other | Attending: Ophthalmology | Admitting: Ophthalmology

## 2020-06-30 ENCOUNTER — Encounter: Payer: Self-pay | Admitting: Ophthalmology

## 2020-06-30 DIAGNOSIS — M316 Other giant cell arteritis: Secondary | ICD-10-CM | POA: Diagnosis not present

## 2020-06-30 DIAGNOSIS — R519 Headache, unspecified: Secondary | ICD-10-CM | POA: Diagnosis not present

## 2020-06-30 LAB — CBC WITH DIFFERENTIAL/PLATELET
Abs Immature Granulocytes: 0.02 10*3/uL (ref 0.00–0.07)
Basophils Absolute: 0.1 10*3/uL (ref 0.0–0.1)
Basophils Relative: 1 %
Eosinophils Absolute: 0.3 10*3/uL (ref 0.0–0.5)
Eosinophils Relative: 6 %
HCT: 39.3 % (ref 36.0–46.0)
Hemoglobin: 12.5 g/dL (ref 12.0–15.0)
Immature Granulocytes: 0 %
Lymphocytes Relative: 49 %
Lymphs Abs: 2.2 10*3/uL (ref 0.7–4.0)
MCH: 27.2 pg (ref 26.0–34.0)
MCHC: 31.8 g/dL (ref 30.0–36.0)
MCV: 85.4 fL (ref 80.0–100.0)
Monocytes Absolute: 0.5 10*3/uL (ref 0.1–1.0)
Monocytes Relative: 11 %
Neutro Abs: 1.5 10*3/uL — ABNORMAL LOW (ref 1.7–7.7)
Neutrophils Relative %: 33 %
Platelets: 224 10*3/uL (ref 150–400)
RBC: 4.6 MIL/uL (ref 3.87–5.11)
RDW: 15.5 % (ref 11.5–15.5)
WBC: 4.5 10*3/uL (ref 4.0–10.5)
nRBC: 0 % (ref 0.0–0.2)

## 2020-06-30 LAB — C-REACTIVE PROTEIN: CRP: 1.2 mg/dL — ABNORMAL HIGH (ref <1.0)

## 2020-06-30 LAB — SEDIMENTATION RATE: Sed Rate: 6 mm/hr (ref 0–22)

## 2020-07-07 DIAGNOSIS — J42 Unspecified chronic bronchitis: Secondary | ICD-10-CM | POA: Diagnosis not present

## 2020-07-07 DIAGNOSIS — K5900 Constipation, unspecified: Secondary | ICD-10-CM | POA: Diagnosis not present

## 2020-07-07 DIAGNOSIS — I110 Hypertensive heart disease with heart failure: Secondary | ICD-10-CM | POA: Diagnosis not present

## 2020-07-07 DIAGNOSIS — E785 Hyperlipidemia, unspecified: Secondary | ICD-10-CM | POA: Diagnosis not present

## 2020-07-07 DIAGNOSIS — G4700 Insomnia, unspecified: Secondary | ICD-10-CM | POA: Diagnosis not present

## 2020-07-07 DIAGNOSIS — I700 Atherosclerosis of aorta: Secondary | ICD-10-CM | POA: Diagnosis not present

## 2020-07-07 DIAGNOSIS — E669 Obesity, unspecified: Secondary | ICD-10-CM | POA: Diagnosis not present

## 2020-07-07 DIAGNOSIS — Z79899 Other long term (current) drug therapy: Secondary | ICD-10-CM | POA: Diagnosis not present

## 2020-07-07 DIAGNOSIS — I5032 Chronic diastolic (congestive) heart failure: Secondary | ICD-10-CM | POA: Diagnosis not present

## 2020-07-07 DIAGNOSIS — E559 Vitamin D deficiency, unspecified: Secondary | ICD-10-CM | POA: Diagnosis not present

## 2020-07-07 DIAGNOSIS — L299 Pruritus, unspecified: Secondary | ICD-10-CM | POA: Diagnosis not present

## 2020-07-07 DIAGNOSIS — R7303 Prediabetes: Secondary | ICD-10-CM | POA: Diagnosis not present

## 2020-07-21 DIAGNOSIS — I110 Hypertensive heart disease with heart failure: Secondary | ICD-10-CM | POA: Diagnosis not present

## 2020-07-21 DIAGNOSIS — Z79899 Other long term (current) drug therapy: Secondary | ICD-10-CM | POA: Diagnosis not present

## 2020-07-21 DIAGNOSIS — Z008 Encounter for other general examination: Secondary | ICD-10-CM | POA: Diagnosis not present

## 2020-07-21 DIAGNOSIS — E669 Obesity, unspecified: Secondary | ICD-10-CM | POA: Diagnosis not present

## 2020-07-21 DIAGNOSIS — R079 Chest pain, unspecified: Secondary | ICD-10-CM | POA: Diagnosis not present

## 2020-07-21 DIAGNOSIS — R2681 Unsteadiness on feet: Secondary | ICD-10-CM | POA: Diagnosis not present

## 2020-07-21 DIAGNOSIS — R001 Bradycardia, unspecified: Secondary | ICD-10-CM | POA: Diagnosis not present

## 2020-07-21 DIAGNOSIS — Z6834 Body mass index (BMI) 34.0-34.9, adult: Secondary | ICD-10-CM | POA: Diagnosis not present

## 2020-07-21 DIAGNOSIS — I5032 Chronic diastolic (congestive) heart failure: Secondary | ICD-10-CM | POA: Diagnosis not present

## 2020-08-02 DIAGNOSIS — N179 Acute kidney failure, unspecified: Secondary | ICD-10-CM | POA: Diagnosis not present

## 2020-08-02 DIAGNOSIS — Z0001 Encounter for general adult medical examination with abnormal findings: Secondary | ICD-10-CM | POA: Diagnosis not present

## 2020-08-02 DIAGNOSIS — I5032 Chronic diastolic (congestive) heart failure: Secondary | ICD-10-CM | POA: Diagnosis not present

## 2020-08-02 DIAGNOSIS — J42 Unspecified chronic bronchitis: Secondary | ICD-10-CM | POA: Diagnosis not present

## 2020-08-02 DIAGNOSIS — I110 Hypertensive heart disease with heart failure: Secondary | ICD-10-CM | POA: Diagnosis not present

## 2020-08-02 DIAGNOSIS — Z6834 Body mass index (BMI) 34.0-34.9, adult: Secondary | ICD-10-CM | POA: Diagnosis not present

## 2020-08-02 DIAGNOSIS — E669 Obesity, unspecified: Secondary | ICD-10-CM | POA: Diagnosis not present

## 2020-08-02 DIAGNOSIS — H6121 Impacted cerumen, right ear: Secondary | ICD-10-CM | POA: Diagnosis not present

## 2020-08-02 DIAGNOSIS — I700 Atherosclerosis of aorta: Secondary | ICD-10-CM | POA: Diagnosis not present

## 2020-08-02 DIAGNOSIS — Z7189 Other specified counseling: Secondary | ICD-10-CM | POA: Diagnosis not present

## 2020-08-02 DIAGNOSIS — G4700 Insomnia, unspecified: Secondary | ICD-10-CM | POA: Diagnosis not present

## 2020-08-02 DIAGNOSIS — E785 Hyperlipidemia, unspecified: Secondary | ICD-10-CM | POA: Diagnosis not present

## 2020-08-17 DIAGNOSIS — I5032 Chronic diastolic (congestive) heart failure: Secondary | ICD-10-CM | POA: Diagnosis not present

## 2020-08-17 DIAGNOSIS — I110 Hypertensive heart disease with heart failure: Secondary | ICD-10-CM | POA: Diagnosis not present

## 2020-09-21 DIAGNOSIS — R7303 Prediabetes: Secondary | ICD-10-CM | POA: Diagnosis not present

## 2020-09-21 DIAGNOSIS — I10 Essential (primary) hypertension: Secondary | ICD-10-CM | POA: Diagnosis not present

## 2020-09-21 DIAGNOSIS — E785 Hyperlipidemia, unspecified: Secondary | ICD-10-CM | POA: Diagnosis not present

## 2020-09-21 DIAGNOSIS — I25118 Atherosclerotic heart disease of native coronary artery with other forms of angina pectoris: Secondary | ICD-10-CM | POA: Diagnosis not present

## 2020-10-10 DIAGNOSIS — Z79899 Other long term (current) drug therapy: Secondary | ICD-10-CM | POA: Diagnosis not present

## 2020-10-10 DIAGNOSIS — R300 Dysuria: Secondary | ICD-10-CM | POA: Diagnosis not present

## 2020-10-17 DIAGNOSIS — M25521 Pain in right elbow: Secondary | ICD-10-CM | POA: Diagnosis not present

## 2020-11-15 DIAGNOSIS — M25521 Pain in right elbow: Secondary | ICD-10-CM | POA: Diagnosis not present

## 2020-11-22 DIAGNOSIS — I10 Essential (primary) hypertension: Secondary | ICD-10-CM | POA: Diagnosis not present

## 2020-11-22 DIAGNOSIS — R350 Frequency of micturition: Secondary | ICD-10-CM | POA: Diagnosis not present

## 2020-11-22 DIAGNOSIS — Z1231 Encounter for screening mammogram for malignant neoplasm of breast: Secondary | ICD-10-CM | POA: Diagnosis not present

## 2020-11-22 DIAGNOSIS — Z01419 Encounter for gynecological examination (general) (routine) without abnormal findings: Secondary | ICD-10-CM | POA: Diagnosis not present

## 2020-11-22 DIAGNOSIS — M8580 Other specified disorders of bone density and structure, unspecified site: Secondary | ICD-10-CM | POA: Diagnosis not present

## 2020-12-04 DIAGNOSIS — M9902 Segmental and somatic dysfunction of thoracic region: Secondary | ICD-10-CM | POA: Diagnosis not present

## 2020-12-04 DIAGNOSIS — N390 Urinary tract infection, site not specified: Secondary | ICD-10-CM | POA: Diagnosis not present

## 2020-12-04 DIAGNOSIS — N1831 Chronic kidney disease, stage 3a: Secondary | ICD-10-CM | POA: Diagnosis not present

## 2020-12-04 DIAGNOSIS — N2581 Secondary hyperparathyroidism of renal origin: Secondary | ICD-10-CM | POA: Diagnosis not present

## 2020-12-04 DIAGNOSIS — E785 Hyperlipidemia, unspecified: Secondary | ICD-10-CM | POA: Diagnosis not present

## 2020-12-04 DIAGNOSIS — D631 Anemia in chronic kidney disease: Secondary | ICD-10-CM | POA: Diagnosis not present

## 2020-12-04 DIAGNOSIS — N189 Chronic kidney disease, unspecified: Secondary | ICD-10-CM | POA: Diagnosis not present

## 2020-12-04 DIAGNOSIS — I700 Atherosclerosis of aorta: Secondary | ICD-10-CM | POA: Diagnosis not present

## 2020-12-04 DIAGNOSIS — N179 Acute kidney failure, unspecified: Secondary | ICD-10-CM | POA: Diagnosis not present

## 2020-12-04 DIAGNOSIS — M791 Myalgia, unspecified site: Secondary | ICD-10-CM | POA: Diagnosis not present

## 2020-12-04 DIAGNOSIS — M9903 Segmental and somatic dysfunction of lumbar region: Secondary | ICD-10-CM | POA: Diagnosis not present

## 2020-12-04 DIAGNOSIS — M9905 Segmental and somatic dysfunction of pelvic region: Secondary | ICD-10-CM | POA: Diagnosis not present

## 2020-12-05 DIAGNOSIS — H04123 Dry eye syndrome of bilateral lacrimal glands: Secondary | ICD-10-CM | POA: Diagnosis not present

## 2020-12-05 DIAGNOSIS — R519 Headache, unspecified: Secondary | ICD-10-CM | POA: Diagnosis not present

## 2020-12-11 DIAGNOSIS — Z6834 Body mass index (BMI) 34.0-34.9, adult: Secondary | ICD-10-CM | POA: Diagnosis not present

## 2020-12-11 DIAGNOSIS — Z008 Encounter for other general examination: Secondary | ICD-10-CM | POA: Diagnosis not present

## 2020-12-11 DIAGNOSIS — I130 Hypertensive heart and chronic kidney disease with heart failure and stage 1 through stage 4 chronic kidney disease, or unspecified chronic kidney disease: Secondary | ICD-10-CM | POA: Diagnosis not present

## 2020-12-11 DIAGNOSIS — R2681 Unsteadiness on feet: Secondary | ICD-10-CM | POA: Diagnosis not present

## 2020-12-11 DIAGNOSIS — N1831 Chronic kidney disease, stage 3a: Secondary | ICD-10-CM | POA: Diagnosis not present

## 2020-12-11 DIAGNOSIS — H9193 Unspecified hearing loss, bilateral: Secondary | ICD-10-CM | POA: Diagnosis not present

## 2020-12-11 DIAGNOSIS — E669 Obesity, unspecified: Secondary | ICD-10-CM | POA: Diagnosis not present

## 2020-12-14 DIAGNOSIS — H903 Sensorineural hearing loss, bilateral: Secondary | ICD-10-CM | POA: Diagnosis not present

## 2020-12-19 DIAGNOSIS — R350 Frequency of micturition: Secondary | ICD-10-CM | POA: Diagnosis not present

## 2020-12-21 DIAGNOSIS — E785 Hyperlipidemia, unspecified: Secondary | ICD-10-CM | POA: Diagnosis not present

## 2020-12-21 DIAGNOSIS — I25118 Atherosclerotic heart disease of native coronary artery with other forms of angina pectoris: Secondary | ICD-10-CM | POA: Diagnosis not present

## 2020-12-21 DIAGNOSIS — I10 Essential (primary) hypertension: Secondary | ICD-10-CM | POA: Diagnosis not present

## 2020-12-28 DIAGNOSIS — U071 COVID-19: Secondary | ICD-10-CM | POA: Diagnosis not present

## 2020-12-28 DIAGNOSIS — Z1152 Encounter for screening for COVID-19: Secondary | ICD-10-CM | POA: Diagnosis not present

## 2020-12-28 DIAGNOSIS — Z9189 Other specified personal risk factors, not elsewhere classified: Secondary | ICD-10-CM | POA: Diagnosis not present

## 2021-01-03 DIAGNOSIS — M9905 Segmental and somatic dysfunction of pelvic region: Secondary | ICD-10-CM | POA: Diagnosis not present

## 2021-01-03 DIAGNOSIS — M9902 Segmental and somatic dysfunction of thoracic region: Secondary | ICD-10-CM | POA: Diagnosis not present

## 2021-01-03 DIAGNOSIS — M9903 Segmental and somatic dysfunction of lumbar region: Secondary | ICD-10-CM | POA: Diagnosis not present

## 2021-01-03 DIAGNOSIS — M791 Myalgia, unspecified site: Secondary | ICD-10-CM | POA: Diagnosis not present

## 2021-01-10 DIAGNOSIS — R059 Cough, unspecified: Secondary | ICD-10-CM | POA: Diagnosis not present

## 2021-04-17 ENCOUNTER — Encounter (HOSPITAL_COMMUNITY): Payer: Self-pay

## 2021-04-17 ENCOUNTER — Emergency Department (HOSPITAL_COMMUNITY)
Admission: EM | Admit: 2021-04-17 | Discharge: 2021-04-18 | Disposition: A | Discharge status: Home / Self Care | Payer: Medicare Other | Attending: Emergency Medicine | Admitting: Emergency Medicine

## 2021-04-17 DIAGNOSIS — Z7982 Long term (current) use of aspirin: Secondary | ICD-10-CM | POA: Insufficient documentation

## 2021-04-17 DIAGNOSIS — Z79899 Other long term (current) drug therapy: Secondary | ICD-10-CM | POA: Insufficient documentation

## 2021-04-17 DIAGNOSIS — J984 Other disorders of lung: Secondary | ICD-10-CM | POA: Insufficient documentation

## 2021-04-17 DIAGNOSIS — K5900 Constipation, unspecified: Secondary | ICD-10-CM | POA: Insufficient documentation

## 2021-04-17 DIAGNOSIS — I10 Essential (primary) hypertension: Secondary | ICD-10-CM | POA: Insufficient documentation

## 2021-04-17 DIAGNOSIS — K760 Fatty (change of) liver, not elsewhere classified: Secondary | ICD-10-CM | POA: Insufficient documentation

## 2021-04-17 DIAGNOSIS — M545 Low back pain, unspecified: Secondary | ICD-10-CM | POA: Insufficient documentation

## 2021-04-17 DIAGNOSIS — E876 Hypokalemia: Secondary | ICD-10-CM | POA: Insufficient documentation

## 2021-04-17 LAB — CBC WITH DIFFERENTIAL/PLATELET
Abs Immature Granulocytes: 0.01 10*3/uL (ref 0.00–0.07)
Basophils Absolute: 0 10*3/uL (ref 0.0–0.1)
Basophils Relative: 1 %
Eosinophils Absolute: 0.2 10*3/uL (ref 0.0–0.5)
Eosinophils Relative: 4 %
HCT: 40.8 % (ref 36.0–46.0)
Hemoglobin: 13.4 g/dL (ref 12.0–15.0)
Immature Granulocytes: 0 %
Lymphocytes Relative: 38 %
Lymphs Abs: 1.9 10*3/uL (ref 0.7–4.0)
MCH: 27.1 pg (ref 26.0–34.0)
MCHC: 32.8 g/dL (ref 30.0–36.0)
MCV: 82.6 fL (ref 80.0–100.0)
Monocytes Absolute: 0.5 10*3/uL (ref 0.1–1.0)
Monocytes Relative: 11 %
Neutro Abs: 2.4 10*3/uL (ref 1.7–7.7)
Neutrophils Relative %: 46 %
Platelets: 238 10*3/uL (ref 150–400)
RBC: 4.94 MIL/uL (ref 3.87–5.11)
RDW: 15.8 % — ABNORMAL HIGH (ref 11.5–15.5)
WBC: 5.1 10*3/uL (ref 4.0–10.5)
nRBC: 0 % (ref 0.0–0.2)

## 2021-04-17 LAB — COMPREHENSIVE METABOLIC PANEL
ALT: 20 U/L (ref 0–44)
AST: 23 U/L (ref 15–41)
Albumin: 3.7 g/dL (ref 3.5–5.0)
Alkaline Phosphatase: 89 U/L (ref 38–126)
Anion gap: 4 — ABNORMAL LOW (ref 5–15)
BUN: 12 mg/dL (ref 8–23)
CO2: 26 mmol/L (ref 22–32)
Calcium: 9.2 mg/dL (ref 8.9–10.3)
Chloride: 108 mmol/L (ref 98–111)
Creatinine, Ser: 0.86 mg/dL (ref 0.44–1.00)
GFR, Estimated: >60 mL/min (ref >60)
Glucose, Bld: 119 mg/dL — ABNORMAL HIGH (ref 70–99)
Potassium: 3.1 mmol/L — ABNORMAL LOW (ref 3.5–5.1)
Sodium: 138 mmol/L (ref 135–145)
Total Bilirubin: 0.3 mg/dL (ref 0.3–1.2)
Total Protein: 7 g/dL (ref 6.5–8.1)

## 2021-04-17 LAB — LIPASE, BLOOD: Lipase: 45 U/L (ref 11–51)

## 2021-04-17 MED ORDER — SODIUM CHLORIDE 0.9 % IV BOLUS
500.0000 mL | Freq: Once | INTRAVENOUS | Status: AC
  Administered 2021-04-17: 500 mL via INTRAVENOUS

## 2021-04-17 NOTE — ED Triage Notes (Signed)
Pt complains of being constipated for three or four days, she's tried some type of gummy with no relief, states that the fleets hasn't worked in the past so she wasn't doing that ?Pt also complains of all over abd pain and rectal pain ?

## 2021-04-17 NOTE — ED Provider Notes (Signed)
?[facility] DEPT ?Provider Note ? ? ?CSN: [id number] ?Arrival date & time: 04/17/21  2231 ? ?  ? ?History ? ?Chief Complaint  ?Patient presents with  ? Constipation  ? Abdominal Pain  ? ? ?Kathleen Pacheco is a 86 y.o. female with a hx of hypertension, hyperlipidemia, migraines, and prior hysterectomy & cholecystectomy who presents to the ED via EMS with complaints of constipation x 4 days and abdominal pain that began tonight. Patient reports her last BM was 04/14/21 and was fairly normal for her, she has not had a BM or passed gas since. Tonight was straining to have a bowel movement with acute onset of pain in the lower abdomen, back and rectum. Constant since onset, no alleviating/aggravating factors. Typically takes "Olly" gummies to help with her constipation issues daily, missed it on Sunday. Tried a Fleet enema at home without relief. Denies fever, vomiting, melena, diarrhea, or dysuria.  ? ?HPI ? ?  ? ?Home Medications ?Prior to Admission medications   ?Medication Sig Start Date End Date Taking? Authorizing Provider  ?albuterol (VENTOLIN HFA) 108 (90 Base) MCG/ACT inhaler INHALE 2 PUFFS INTO THE LUNGS EVERY 4 HOURS AS NEEDED FOR WHEEZING, SHORTNESS OF BREATH, OR COUGH 11/30/18   Forrest Moron, MD  ?Alpha-D-Galactosidase Satira Mccallum) TABS Take 2 tablets by mouth every 6 (six) hours as needed (for gas).    [provider]  ?amLODipine (NORVASC) 5 MG tablet Take 5 mg by mouth daily.    [provider]  ?aspirin EC 81 MG tablet Take 81 mg by mouth daily.    [provider]  ?atorvastatin (LIPITOR) 40 MG tablet Take 40 mg by mouth daily.    [provider]  ?azithromycin (ZITHROMAX) 250 MG tablet Take 2 tablets on first day, then take 1 daily. Finish entire supply. 05/31/19   Maximiano Coss, NP  ?B Complex-C (B-COMPLEX WITH VITAMIN C) tablet Take 1 tablet by mouth daily.    [provider]  ?beclomethasone (QVAR REDIHALER) 80 MCG/ACT inhaler  Inhale 2 puffs in the morning and 1 puff in the evening. 11/30/18   Forrest Moron, MD  ?benzonatate (TESSALON) 100 MG capsule Take 1 capsule (100 mg total) by mouth 2 (two) times daily as needed for cough. 05/31/19   Maximiano Coss, NP  ?cetirizine (ZYRTEC) 10 MG tablet Take 1 tablet (10 mg total) by mouth daily as needed for allergies. 11/26/17   Tenna Delaine D, PA-C  ?ezetimibe (ZETIA) 10 MG tablet ezetimibe 10 mg tablet    [provider]  ?fluticasone (FLONASE) 50 MCG/ACT nasal spray Place 2 sprays into both nostrils daily. 05/31/19   Maximiano Coss, NP  ?hydrochlorothiazide (HYDRODIURIL) 25 MG tablet Take 25 mg by mouth daily.    [provider]  ?linaclotide Rolan Lipa) 145 MCG CAPS capsule Take 1 capsule (145 mcg total) by mouth daily before breakfast. 05/10/19   Forrest Moron, MD  ?meloxicam (MOBIC) 15 MG tablet Take 15 mg by mouth daily.    [provider]  ?montelukast (SINGULAIR) 10 MG tablet TAKE 1 TABLET BY MOUTH EVERYDAY AT BEDTIME 02/22/19   Forrest Moron, MD  ?Multiple Vitamins-Minerals (MULTIVITAMIN GUMMIES ADULT) CHEW Chew 3 each by mouth daily.    [provider]  ?potassium chloride SA (K-DUR,KLOR-CON) 20 MEQ tablet Take 20 mEq by mouth daily.    [provider]  ?traMADol (ULTRAM) 50 MG tablet Take 50 mg by mouth every 6 (six) hours as needed for moderate pain.  [provider]  ?triamcinolone cream (KENALOG) 0.1 % APPLY TO AFFECTED AREA TWICE A DAY 02/25/19   Delia Chimes A, MD  ?TURMERIC PO Take 5 mLs by mouth daily.    [provider]  ?Delfin Edis 250-50 MCG/DOSE AEPB TAKE 1 PUFF BY MOUTH TWICE A DAY 09/29/19   Maximiano Coss, NP  ?   ? ?Allergies    ?Ace inhibitors, Aspartame, Other, and Penicillins   ? ?Review of Systems   ?Review of Systems  ?Constitutional:  Negative for chills and fever.  ?Respiratory:  Negative for shortness of breath.   ?Cardiovascular:  Negative for chest pain.  ?Gastrointestinal:  Positive  for abdominal pain, constipation and rectal pain. Negative for anal bleeding, blood in stool, nausea and vomiting.  ?Genitourinary:  Negative for dysuria.  ?All other systems reviewed and are negative. ? ?Physical Exam ?Updated Vital Signs ?BP 127/78   Pulse 73   Temp 98 ?F (36.7 ?C) (Oral)   Resp 16   SpO2 95%  ?Physical Exam ?Vitals and nursing note reviewed.  ?Constitutional:   ?   General: She is not in acute distress. ?   Appearance: She is well-developed. She is not toxic-appearing.  ?HENT:  ?   Head: Normocephalic and atraumatic.  ?Eyes:  ?   General:     ?   Right eye: No discharge.     ?   Left eye: No discharge.  ?   Conjunctiva/sclera: Conjunctivae normal.  ?Cardiovascular:  ?   Rate and Rhythm: Normal rate and regular rhythm.  ?Pulmonary:  ?   Effort: No respiratory distress.  ?   Breath sounds: Normal breath sounds. No wheezing or rales.  ?Abdominal:  ?   General: There is no distension.  ?   Palpations: Abdomen is soft.  ?   Tenderness: There is abdominal tenderness in the epigastric area, periumbilical area, left upper quadrant and left lower quadrant.  ?   Comments: Bowel sounds present.   ?Genitourinary: ?   Comments: RN present as chaperone.  ?Patient has multiple non-thrombosed hemorrhoids present with some generalized swelling. There is no induration or fluctuance. No point/focal tenderness. Diffusely uncomfortable. Stool palpated without the rectum on DRE, somewhat firm stool ball, no melena/hematochezia. NO palpable fluctuance on DRE.  ?Musculoskeletal:  ?   Cervical back: Neck supple.  ?   Comments: Diffuse tenderness in the lumbar region without palpable step off or point/focal vertebral tenderness.   ?Skin: ?   General: Skin is warm and dry.  ?Neurological:  ?   Mental Status: She is alert.  ?   Comments: Clear speech.   ?Psychiatric:     ?   Behavior: Behavior normal.  ? ? ?ED Results / Procedures / Treatments   ?Labs ?(all labs ordered are listed, but only abnormal results are  displayed) ?Labs Reviewed  ?CBC WITH DIFFERENTIAL/PLATELET - Abnormal; Notable for the following components:  ?    Result Value  ? RDW 15.8 (*)   ? All other components within normal limits  ?COMPREHENSIVE METABOLIC PANEL - Abnormal; Notable for the following components:  ? Potassium 3.1 (*)   ? Glucose, Bld 119 (*)   ? Anion gap 4 (*)   ? All other components within normal limits  ?LIPASE, BLOOD  ?URINALYSIS, ROUTINE W REFLEX MICROSCOPIC  ? ? ?EKG ?None ? ?Radiology ?CT Abdomen Pelvis W Contrast ? ?Result Date: 04/18/2021 ?CLINICAL DATA:  Acute nonlocalized abdominal pain. Constipation for 4 days. Rectal pain. EXAM: CT ABDOMEN AND PELVIS  WITH CONTRAST TECHNIQUE: Multidetector CT imaging of the abdomen and pelvis was performed using the standard protocol following bolus administration of intravenous contrast. RADIATION DOSE REDUCTION: This exam was performed according to the departmental dose-optimization program which includes automated exposure control, adjustment of the mA and/or kV according to patient size and/or use of iterative reconstruction technique. CONTRAST:  161m OMNIPAQUE IOHEXOL 300 MG/ML  SOLN COMPARISON:  06/05/2020 FINDINGS: Lower chest: Mild linear scarring in the lung bases. Cardiac enlargement. Hepatobiliary: Mild diffuse fatty infiltration of the liver. No focal lesions. Surgical absence of the gallbladder. No bile duct dilatation. Pancreas: Unremarkable. No pancreatic ductal dilatation or surrounding inflammatory changes. Spleen: Normal in size without focal abnormality. Adrenals/Urinary Tract: Adrenal glands are unremarkable. Kidneys are normal, without renal calculi, focal lesion, or hydronephrosis. Bladder is unremarkable. Stomach/Bowel: Stomach, small bowel, and colon are not abnormally distended. Scattered stool throughout the colon. Prominent stool in the rectum. There is suggestion of asymmetrical soft tissue infiltration in the right perianal region. This could be due to patient  positioning artifact but may indicate a perirectal abscess in the appropriate clinical setting. Correlate with physical examination. Small amount of free fluid in the pelvis and presacral region. Colonic diverticulosis wi

## 2021-04-18 ENCOUNTER — Other Ambulatory Visit (HOSPITAL_COMMUNITY): Payer: Medicare Other

## 2021-04-18 ENCOUNTER — Emergency Department (HOSPITAL_COMMUNITY): Payer: Medicare Other

## 2021-04-18 DIAGNOSIS — K5900 Constipation, unspecified: Secondary | ICD-10-CM | POA: Diagnosis not present

## 2021-04-18 LAB — URINALYSIS, ROUTINE W REFLEX MICROSCOPIC
Bilirubin Urine: NEGATIVE
Glucose, UA: NEGATIVE mg/dL
Hgb urine dipstick: NEGATIVE
Ketones, ur: NEGATIVE mg/dL
Leukocytes,Ua: NEGATIVE
Nitrite: NEGATIVE
Protein, ur: NEGATIVE mg/dL
Specific Gravity, Urine: 1.029 (ref 1.005–1.030)
pH: 7 (ref 5.0–8.0)

## 2021-04-18 MED ORDER — AMOXICILLIN-POT CLAVULANATE 875-125 MG PO TABS: 1.0000 | ORAL_TABLET | Freq: Two times a day (BID) | ORAL | 0 refills | Status: DC

## 2021-04-18 MED ORDER — POLYETHYLENE GLYCOL 3350 17 G PO PACK: 17.0000 g | PACK | Freq: Every day | ORAL | 0 refills | Status: DC | PRN

## 2021-04-18 MED ORDER — DOCUSATE SODIUM 100 MG PO CAPS
100.0000 mg | ORAL_CAPSULE | Freq: Two times a day (BID) | ORAL | 0 refills | Status: DC | PRN
Start: 2021-04-18 — End: 2021-04-18

## 2021-04-18 MED ORDER — MINERAL OIL RE ENEM
1.0000 | ENEMA | Freq: Once | RECTAL | Status: AC
  Administered 2021-04-18: 1 via RECTAL
  Filled 2021-04-18: qty 1

## 2021-04-18 MED ORDER — LIDOCAINE-HYDROCORTISONE ACE 3-0.5 % RE KIT
PACK | RECTAL | 0 refills | Status: DC
Start: 2021-04-18 — End: 2021-04-18

## 2021-04-18 MED ORDER — LIDOCAINE-HYDROCORTISONE ACE 3-0.5 % RE KIT: PACK | RECTAL | 0 refills | Status: AC

## 2021-04-18 MED ORDER — MAGNESIUM HYDROXIDE 400 MG/5ML PO SUSP
15.0000 mL | Freq: Once | ORAL | Status: AC
  Administered 2021-04-18: 15 mL via ORAL
  Filled 2021-04-18: qty 30

## 2021-04-18 MED ORDER — POTASSIUM CHLORIDE CRYS ER 20 MEQ PO TBCR
40.0000 meq | EXTENDED_RELEASE_TABLET | Freq: Once | ORAL | Status: AC
  Administered 2021-04-18: 40 meq via ORAL
  Filled 2021-04-18: qty 2

## 2021-04-18 MED ORDER — DOCUSATE SODIUM 100 MG PO CAPS: 100.0000 mg | ORAL_CAPSULE | Freq: Two times a day (BID) | ORAL | 0 refills | Status: AC | PRN

## 2021-04-18 MED ORDER — LIDOCAINE HCL URETHRAL/MUCOSAL 2 % EX GEL
1.0000 "application " | Freq: Once | CUTANEOUS | Status: AC
  Administered 2021-04-18: 1
  Filled 2021-04-18: qty 11

## 2021-04-18 MED ORDER — SODIUM CHLORIDE 0.9 % IV BOLUS
500.0000 mL | Freq: Once | INTRAVENOUS | Status: AC
  Administered 2021-04-18: 500 mL via INTRAVENOUS

## 2021-04-18 MED ORDER — POLYETHYLENE GLYCOL 3350 17 G PO PACK
17.0000 g | PACK | Freq: Once | ORAL | Status: AC
  Administered 2021-04-18: 17 g via ORAL
  Filled 2021-04-18: qty 1

## 2021-04-18 MED ORDER — SENNOSIDES-DOCUSATE SODIUM 8.6-50 MG PO TABS
1.0000 | ORAL_TABLET | Freq: Once | ORAL | Status: AC
  Administered 2021-04-18: 1 via ORAL
  Filled 2021-04-18: qty 1

## 2021-04-18 MED ORDER — IOHEXOL 300 MG/ML  SOLN
100.0000 mL | Freq: Once | INTRAMUSCULAR | Status: AC | PRN
  Administered 2021-04-18: 100 mL via INTRAVENOUS

## 2021-04-18 NOTE — Discharge Instructions (Addendum)
You were seen in the ER today for constipation and abdominal/rectal pain.  ? ?Please take miralax daily- can increase to twice per day as well as colace twice per day as needed for constipation.  ? ?Give some inflammation to one of your hemorrhoids we are sending you home with augmentin which is an antibiotic.  ? ?Please take all of your antibiotics until finished. You may develop abdominal discomfort or diarrhea from the antibiotic.  You may help offset this with probiotics which you can buy at the store (ask your pharmacist if unable to find) or get probiotics in the form of eating yogurt. Do not eat or take the probiotics until 2 hours after your antibiotic. If you are unable to tolerate these side effects follow-up with your primary care provider or return to the emergency department.  ? ?If you begin to experience any blistering, rashes, swelling, or difficulty breathing seek medical care for evaluation of potentially more serious side effects.  ? ?Please be aware that this medication may interact with other medications you are taking, please be sure to discuss your medication list with your pharmacist.  ? ?Please use the lidocaine-hydrocortisone twice per day to the rectum as needed for pain.  ? ?Please include potassium rich foods in your diet as your potassium was low today- have this rechecked by your primary care provider.  ? ?Please follow attached diet guidelines.  Follow-up with your primary care provider within 3 days.  Return to the emergency department for new or worsening symptoms including but not limited to new or worsening pain, inability to have a bowel movement, blood in your stool, fever, passing out, or any other concerns. ?

## 2021-04-18 NOTE — ED Notes (Signed)
Pt states that she is feeling much better at this time ? ?

## 2021-04-18 NOTE — ED Notes (Signed)
Patient attempted to provide urine specimen but was unable to. Pt is tearful in the bathroom c/o rectal pain. Samantha PA notified. ?

## 2021-04-18 NOTE — ED Notes (Signed)
Pt ambulated (w/ assist from cane) to restroom.  ?

## 2021-04-18 NOTE — ED Notes (Signed)
Discharge instructions reviewed, questions answered. Rx education provided. Pt states understanding and no further questions. Pt wheeled to ride upon discharge. No s/s of distress noted. ? ?

## 2021-06-21 ENCOUNTER — Emergency Department (HOSPITAL_BASED_OUTPATIENT_CLINIC_OR_DEPARTMENT_OTHER): Payer: Medicare Other

## 2021-06-21 ENCOUNTER — Emergency Department (HOSPITAL_COMMUNITY)
Admission: EM | Admit: 2021-06-21 | Discharge: 2021-06-21 | Disposition: A | Discharge status: Home / Self Care | Payer: Medicare Other | Attending: Emergency Medicine | Admitting: Emergency Medicine

## 2021-06-21 ENCOUNTER — Encounter (HOSPITAL_COMMUNITY): Payer: Self-pay

## 2021-06-21 ENCOUNTER — Other Ambulatory Visit: Payer: Self-pay

## 2021-06-21 DIAGNOSIS — M7989 Other specified soft tissue disorders: Secondary | ICD-10-CM | POA: Diagnosis not present

## 2021-06-21 DIAGNOSIS — R21 Rash and other nonspecific skin eruption: Secondary | ICD-10-CM

## 2021-06-21 DIAGNOSIS — M79604 Pain in right leg: Secondary | ICD-10-CM

## 2021-06-21 DIAGNOSIS — R059 Cough, unspecified: Secondary | ICD-10-CM | POA: Diagnosis not present

## 2021-06-21 DIAGNOSIS — I82461 Acute embolism and thrombosis of right calf muscular vein: Secondary | ICD-10-CM | POA: Diagnosis not present

## 2021-06-21 DIAGNOSIS — E876 Hypokalemia: Secondary | ICD-10-CM | POA: Diagnosis not present

## 2021-06-21 DIAGNOSIS — Z7982 Long term (current) use of aspirin: Secondary | ICD-10-CM | POA: Insufficient documentation

## 2021-06-21 DIAGNOSIS — M79605 Pain in left leg: Secondary | ICD-10-CM | POA: Diagnosis not present

## 2021-06-21 LAB — CBC WITH DIFFERENTIAL/PLATELET
Abs Immature Granulocytes: 0.01 10*3/uL (ref 0.00–0.07)
Basophils Absolute: 0.1 10*3/uL (ref 0.0–0.1)
Basophils Relative: 1 %
Eosinophils Absolute: 0.3 10*3/uL (ref 0.0–0.5)
Eosinophils Relative: 6 %
HCT: 41.1 % (ref 36.0–46.0)
Hemoglobin: 13.6 g/dL (ref 12.0–15.0)
Immature Granulocytes: 0 %
Lymphocytes Relative: 50 %
Lymphs Abs: 2.6 10*3/uL (ref 0.7–4.0)
MCH: 26.7 pg (ref 26.0–34.0)
MCHC: 33.1 g/dL (ref 30.0–36.0)
MCV: 80.6 fL (ref 80.0–100.0)
Monocytes Absolute: 0.5 10*3/uL (ref 0.1–1.0)
Monocytes Relative: 10 %
Neutro Abs: 1.7 10*3/uL (ref 1.7–7.7)
Neutrophils Relative %: 33 %
Platelets: 259 10*3/uL (ref 150–400)
RBC: 5.1 MIL/uL (ref 3.87–5.11)
RDW: 14.7 % (ref 11.5–15.5)
WBC: 5.2 10*3/uL (ref 4.0–10.5)
nRBC: 0 % (ref 0.0–0.2)

## 2021-06-21 LAB — COMPREHENSIVE METABOLIC PANEL
ALT: 22 U/L (ref 0–44)
AST: 28 U/L (ref 15–41)
Albumin: 3.9 g/dL (ref 3.5–5.0)
Alkaline Phosphatase: 101 U/L (ref 38–126)
Anion gap: 8 (ref 5–15)
BUN: 12 mg/dL (ref 8–23)
CO2: 27 mmol/L (ref 22–32)
Calcium: 10.3 mg/dL (ref 8.9–10.3)
Chloride: 107 mmol/L (ref 98–111)
Creatinine, Ser: 1 mg/dL (ref 0.44–1.00)
GFR, Estimated: 56 mL/min — ABNORMAL LOW (ref >60)
Glucose, Bld: 108 mg/dL — ABNORMAL HIGH (ref 70–99)
Potassium: 3.2 mmol/L — ABNORMAL LOW (ref 3.5–5.1)
Sodium: 142 mmol/L (ref 135–145)
Total Bilirubin: 0.7 mg/dL (ref 0.3–1.2)
Total Protein: 7.7 g/dL (ref 6.5–8.1)

## 2021-06-21 LAB — PROTIME-INR
INR: 1 (ref 0.8–1.2)
Prothrombin Time: 13.6 seconds (ref 11.4–15.2)

## 2021-06-21 MED ORDER — APIXABAN (ELIQUIS) VTE STARTER PACK (10MG AND 5MG): ORAL_TABLET | ORAL | 0 refills | Status: DC

## 2021-06-21 MED ORDER — APIXABAN 5 MG PO TABS
10.0000 mg | ORAL_TABLET | Freq: Once | ORAL | Status: AC
  Administered 2021-06-21: 10 mg via ORAL
  Filled 2021-06-21: qty 2

## 2021-06-21 MED ORDER — APIXABAN (ELIQUIS) EDUCATION KIT FOR DVT/PE PATIENTS: PACK | Freq: Once | Status: DC

## 2021-06-21 NOTE — Discharge Instructions (Addendum)
You were seen in the emergency department for rash on your lower extremities and pain.  You had an ultrasound that showed an acute blood clot in your right calf.  We are starting you on a blood thinner.  Its 2 tablets twice a day for first week and then 1 tablet twice a day.  We will be important that you follow-up with your primary care doctor.  Return to the emergency department if any worsening or concerning symptoms.

## 2021-06-21 NOTE — ED Provider Notes (Signed)
[facility] DEPT Provider Note   CSN: [id number] Arrival date & time: 06/21/21  1416     History  No chief complaint on file.   Kathleen Pacheco is a 86 y.o. female.  She is here with complaint of rash and swelling pain of bilateral lower extremities.  She said it started about 5 days ago as a small rash on her feet.  Was itchy and progressed up to the level of her knees.  She has been trying some lotion and Benadryl and hydrocortisone cream.  Her legs are sore.  They are slightly swollen.  She went to urgent care who referred her here to get an ultrasound for blood clot.  No chest pain or shortness of breath.  She did have a cough earlier today no hemoptysis.  No fevers or chills.  No numbness or weakness.  She does feel her rash is getting better since she has been using the cream.  The history is provided by the patient.  Rash Location:  Leg Leg rash location:  L lower leg, R lower leg, L foot, R foot, L ankle and R ankle Quality: dryness, itchiness, painful and redness   Quality: not weeping   Pain details:    Quality:  Pressure   Severity:  Moderate   Onset quality:  Gradual Onset quality:  Gradual Duration:  6 days Timing:  Constant Progression:  Unchanged Chronicity:  New Relieved by:  Topical steroids and antihistamines Worsened by:  Contact Associated symptoms: no abdominal pain, no diarrhea, no fever, no joint pain, no myalgias, no nausea, no shortness of breath, no sore throat and not vomiting        Home Medications Prior to Admission medications   Medication Sig Start Date End Date Taking? Authorizing Provider  albuterol (VENTOLIN HFA) 108 (90 Base) MCG/ACT inhaler INHALE 2 PUFFS INTO THE LUNGS EVERY 4 HOURS AS NEEDED FOR WHEEZING, SHORTNESS OF BREATH, OR COUGH 11/30/18   Forrest Moron, MD  Alpha-D-Galactosidase [facility]) TABS Take 2 tablets by mouth every 6 (six) hours as needed (for gas).    [provider]  amLODipine  (NORVASC) 5 MG tablet Take 5 mg by mouth daily.    [provider]  amoxicillin-clavulanate (AUGMENTIN) 875-125 MG tablet Take 1 tablet by mouth every 12 (twelve) hours. 04/18/21   Petrucelli, Glynda Jaeger, PA-C  aspirin EC 81 MG tablet Take 81 mg by mouth daily.    [provider]  atorvastatin (LIPITOR) 40 MG tablet Take 40 mg by mouth daily.    [provider]  azithromycin (ZITHROMAX) 250 MG tablet Take 2 tablets on first day, then take 1 daily. Finish entire supply. 05/31/19   Maximiano Coss, NP  B Complex-C (B-COMPLEX WITH VITAMIN C) tablet Take 1 tablet by mouth daily.    [provider]  beclomethasone (QVAR REDIHALER) 80 MCG/ACT inhaler Inhale 2 puffs in the morning and 1 puff in the evening. 11/30/18   Forrest Moron, MD  benzonatate (TESSALON) 100 MG capsule Take 1 capsule (100 mg total) by mouth 2 (two) times daily as needed for cough. 05/31/19   Maximiano Coss, NP  cetirizine (ZYRTEC) 10 MG tablet Take 1 tablet (10 mg total) by mouth daily as needed for allergies. 11/26/17   Tenna Delaine D, PA-C  docusate sodium (COLACE) 100 MG capsule Take 1 capsule (100 mg total) by mouth 2 (two) times daily as needed for mild constipation. 04/18/21   Petrucelli, Samantha R, PA-C  ezetimibe (ZETIA)  10 MG tablet ezetimibe 10 mg tablet    [provider]  fluticasone (FLONASE) 50 MCG/ACT nasal spray Place 2 sprays into both nostrils daily. 05/31/19   Maximiano Coss, NP  hydrochlorothiazide (HYDRODIURIL) 25 MG tablet Take 25 mg by mouth daily.    [provider]  Lidocaine-Hydrocortisone Ace 3-0.5 % KIT App;y to anal area or by applicator into rectum every 12 hours as needed for pain. 04/18/21   Petrucelli, Glynda Jaeger, PA-C  linaclotide (LINZESS) 145 MCG CAPS capsule Take 1 capsule (145 mcg total) by mouth daily before breakfast. 05/10/19   Forrest Moron, MD  meloxicam (MOBIC) 15 MG tablet Take 15 mg by mouth daily.    [provider]   montelukast (SINGULAIR) 10 MG tablet TAKE 1 TABLET BY MOUTH EVERYDAY AT BEDTIME 02/22/19   Forrest Moron, MD  Multiple Vitamins-Minerals (MULTIVITAMIN GUMMIES ADULT) CHEW Chew 3 each by mouth daily.    [provider]  polyethylene glycol (MIRALAX) 17 g packet Take 17 g by mouth daily as needed for moderate constipation. 04/18/21   Petrucelli, Samantha R, PA-C  potassium chloride SA (K-DUR,KLOR-CON) 20 MEQ tablet Take 20 mEq by mouth daily.    [provider]  traMADol (ULTRAM) 50 MG tablet Take 50 mg by mouth every 6 (six) hours as needed for moderate pain.    [provider]  triamcinolone cream (KENALOG) 0.1 % APPLY TO AFFECTED AREA TWICE A DAY 02/25/19   Delia Chimes A, MD  TURMERIC PO Take 5 mLs by mouth daily.    [provider]  Grant Ruts INHUB 250-50 MCG/DOSE AEPB TAKE 1 PUFF BY MOUTH TWICE A DAY 09/29/19   Maximiano Coss, NP      Allergies    Ace inhibitors, Aspartame, Other, and Penicillins    Review of Systems   Review of Systems  Constitutional:  Negative for fever.  HENT:  Negative for sore throat.   Eyes:  Negative for visual disturbance.  Respiratory:  Negative for shortness of breath.   Cardiovascular:  Positive for leg swelling. Negative for chest pain.  Gastrointestinal:  Negative for abdominal pain, diarrhea, nausea and vomiting.  Genitourinary:  Negative for dysuria.  Musculoskeletal:  Negative for arthralgias and myalgias.  Skin:  Positive for rash.  Neurological:  Negative for syncope.    Physical Exam Updated Vital Signs BP 138/83 (BP Location: Right Arm)   Pulse 74   Temp 98.3 F (36.8 C) (Oral)   Resp 20   SpO2 100%  Physical Exam Vitals and nursing note reviewed.  Constitutional:      General: She is not in acute distress.    Appearance: Normal appearance. She is well-developed.  HENT:     Head: Normocephalic and atraumatic.  Eyes:     Conjunctiva/sclera: Conjunctivae normal.  Cardiovascular:     Rate and  Rhythm: Normal rate and regular rhythm.     Heart sounds: No murmur heard. Pulmonary:     Effort: Pulmonary effort is normal. No respiratory distress.     Breath sounds: Normal breath sounds.  Abdominal:     Palpations: Abdomen is soft.     Tenderness: There is no abdominal tenderness. There is no guarding or rebound.  Musculoskeletal:        General: Tenderness present. No deformity.     Cervical back: Neck supple.     Comments: She has some minimal edema tenderness of both of her lower legs.  There is a dry flaky rash over her lower extremities.  No open lesions.  Distal pulses and sensation intact.  No cords appreciated  Skin:    General: Skin is warm and dry.     Capillary Refill: Capillary refill takes less than 2 seconds.  Neurological:     General: No focal deficit present.     Mental Status: She is alert.     Sensory: No sensory deficit.     Motor: No weakness.      ED Results / Procedures / Treatments   Labs (all labs ordered are listed, but only abnormal results are displayed) Labs Reviewed  COMPREHENSIVE METABOLIC PANEL - Abnormal; Notable for the following components:      Result Value   Potassium 3.2 (*)    Glucose, Bld 108 (*)    GFR, Estimated 56 (*)    All other components within normal limits  CBC WITH DIFFERENTIAL/PLATELET  PROTIME-INR    EKG None  Radiology VAS [place] LOWER EXTREMITY VENOUS (DVT) (7a-7p)  Result Date: 06/21/2021  Lower Venous DVT Study Patient Name:  ANDRIKA PERAZA  Date of Exam:   06/21/2021 Medical Rec #: [id number]        Accession #:    [id number] Date of Birth: 05-17-1935        Patient Gender: F Patient Age:   11 years Exam Location:  [facility] Procedure:      VAS [place] LOWER EXTREMITY VENOUS (DVT) Referring Phys: Jilleen Essner --------------------------------------------------------------------------------  Indications: Pain, and Swelling.  Limitations: Body habitus and poor ultrasound/tissue interface. Comparison Study: Previous  RLEV exam on 05/06/08 was negative for DVT. Performing Technologist: Ernestene Mention RVT, RDMS  Examination Guidelines: A complete evaluation includes B-mode imaging, spectral Doppler, color Doppler, and power Doppler as needed of all accessible portions of each vessel. Bilateral testing is considered an integral part of a complete examination. Limited examinations for reoccurring indications may be performed as noted. The reflux portion of the exam is performed with the patient in reverse Trendelenburg.  +---------+---------------+---------+-----------+----------+------------------+ RIGHT    CompressibilityPhasicitySpontaneityPropertiesThrombus Aging     +---------+---------------+---------+-----------+----------+------------------+ CFV      Full           Yes      Yes                                     +---------+---------------+---------+-----------+----------+------------------+ SFJ      Full                                                            +---------+---------------+---------+-----------+----------+------------------+ FV Prox  Full           Yes      Yes                                     +---------+---------------+---------+-----------+----------+------------------+ FV Mid   Full           Yes      Yes                                     +---------+---------------+---------+-----------+----------+------------------+ FV DistalFull  Yes      Yes                                     +---------+---------------+---------+-----------+----------+------------------+ PFV      Full                                                            +---------+---------------+---------+-----------+----------+------------------+ POP      Full           Yes      Yes                                     +---------+---------------+---------+-----------+----------+------------------+ PTV      Partial        No       No                   Acute - one of                                                            paired             +---------+---------------+---------+-----------+----------+------------------+ PERO     Full                                                            +---------+---------------+---------+-----------+----------+------------------+   +--------+---------------+---------+-----------+----------+--------------------+ LEFT    CompressibilityPhasicitySpontaneityPropertiesThrombus Aging       +--------+---------------+---------+-----------+----------+--------------------+ CFV     Full           Yes      Yes                                       +--------+---------------+---------+-----------+----------+--------------------+ SFJ     Full                                                              +--------+---------------+---------+-----------+----------+--------------------+ FV Prox Full           Yes      Yes                                       +--------+---------------+---------+-----------+----------+--------------------+ FV Mid  Full           Yes      Yes                                       +--------+---------------+---------+-----------+----------+--------------------+  FV      Full           Yes      Yes                                       Distal                                                                    +--------+---------------+---------+-----------+----------+--------------------+ PFV                    Yes      Yes                  patent by                                                                 color/doppler        +--------+---------------+---------+-----------+----------+--------------------+ POP     Full           Yes      Yes                                       +--------+---------------+---------+-----------+----------+--------------------+ PTV     Full                                                               +--------+---------------+---------+-----------+----------+--------------------+ PERO                                                 Not visualized       +--------+---------------+---------+-----------+----------+--------------------+   Left Technical Findings: Not visualized segments include peroneal veins.   Summary: BILATERAL: -No evidence of popliteal cyst, bilaterally. RIGHT: - Findings consistent with acute deep vein thrombosis involving the right posterior tibial veins.  LEFT: - There is no evidence of deep vein thrombosis in the lower extremity.  *See table(s) above for measurements and observations. Electronically signed by Monica Martinez MD on 06/21/2021 at 4:50:04 PM.    Final     Procedures Procedures    Medications Ordered in ED Medications  apixaban (ELIQUIS) tablet 10 mg (10 mg Oral Given 06/21/21 1802)    ED Course/ Medical Decision Making/ A&P                           Medical Decision Making Amount and/or Complexity of Data Reviewed Labs: ordered.  Risk Prescription drug management.  This patient complains of bilateral calf pain and swelling,  lower extremity rash; this involves an extensive number of treatment Options and is a complaint that carries with it a high risk of complications and morbidity. The differential includes DVT, peripheral edema, cellulitis, rash  I ordered, reviewed and interpreted labs, which included CBC with normal white count, chemistry with mildly low potassium, INR normal I ordered medication oral Eliquis and reviewed PMP when indicated. I ordered imaging studies which included duplex bilateral lower extremities and I independently    visualized and interpreted imaging which showed right calf DVT Previous records obtained and reviewed in epic no recent admissions  Cardiac monitoring reviewed, normal sinus rhythm Social determinants considered, no significant barriers Critical Interventions: None  After the interventions stated  above, I reevaluated the patient and found patient to be satting well on room air with no chest pain or shortness of breath. Admission and further testing considered, no indications for admission.  Will need initiation of oral anticoagulation for DVT and outpatient follow-up with PCP.  Patient understanding of plan and all questions answered.  Return instructions discussed.          Final Clinical Impression(s) / ED Diagnoses Final diagnoses:  Acute deep vein thrombosis (DVT) of calf muscle vein of right lower extremity (HCC)  Rash    Rx / DC Orders ED Discharge Orders          Ordered    APIXABAN (ELIQUIS) VTE STARTER PACK ($RemoveBefor'10MG'[id number]$  AND $Re'5MG'aAf$ )        06/21/21 1712              Hayden Rasmussen, MD 06/21/21 1921

## 2021-06-21 NOTE — ED Triage Notes (Addendum)
Pt. BIB [facility] from [facility] office. Her doctor was concerned of her lower leg swelling and possible DVT's in both lower leg extremities and asked that she be sent to [facility] ER. Pt. C/o lower leg swelling and tenderness upon touch and rash on both lower extremities since this past Saturday( May 28 th 2023).

## 2021-06-22 ENCOUNTER — Telehealth (HOSPITAL_COMMUNITY): Payer: Self-pay | Admitting: Emergency Medicine

## 2021-06-22 MED ORDER — APIXABAN (ELIQUIS) VTE STARTER PACK (10MG AND 5MG): ORAL_TABLET | ORAL | 0 refills | Status: DC

## 2021-06-22 NOTE — Telephone Encounter (Signed)
Patient called asking for Rx to be sent to [facility] instead of [facility].

## 2021-06-29 ENCOUNTER — Encounter (HOSPITAL_COMMUNITY): Payer: Self-pay

## 2021-06-29 ENCOUNTER — Other Ambulatory Visit: Payer: Self-pay

## 2021-06-29 ENCOUNTER — Emergency Department (HOSPITAL_COMMUNITY)
Admission: EM | Admit: 2021-06-29 | Discharge: 2021-06-29 | Disposition: A | Discharge status: Home / Self Care | Payer: Medicare Other | Attending: Emergency Medicine | Admitting: Emergency Medicine

## 2021-06-29 DIAGNOSIS — M79604 Pain in right leg: Secondary | ICD-10-CM

## 2021-06-29 DIAGNOSIS — I824Y1 Acute embolism and thrombosis of unspecified deep veins of right proximal lower extremity: Secondary | ICD-10-CM | POA: Insufficient documentation

## 2021-06-29 DIAGNOSIS — Z79899 Other long term (current) drug therapy: Secondary | ICD-10-CM | POA: Insufficient documentation

## 2021-06-29 DIAGNOSIS — Z7901 Long term (current) use of anticoagulants: Secondary | ICD-10-CM | POA: Diagnosis not present

## 2021-06-29 LAB — CBC WITH DIFFERENTIAL/PLATELET
Abs Immature Granulocytes: 0.01 10*3/uL (ref 0.00–0.07)
Basophils Absolute: 0.1 10*3/uL (ref 0.0–0.1)
Basophils Relative: 1 %
Eosinophils Absolute: 0.3 10*3/uL (ref 0.0–0.5)
Eosinophils Relative: 6 %
HCT: 42 % (ref 36.0–46.0)
Hemoglobin: 13.7 g/dL (ref 12.0–15.0)
Immature Granulocytes: 0 %
Lymphocytes Relative: 47 %
Lymphs Abs: 2.8 10*3/uL (ref 0.7–4.0)
MCH: 26.6 pg (ref 26.0–34.0)
MCHC: 32.6 g/dL (ref 30.0–36.0)
MCV: 81.4 fL (ref 80.0–100.0)
Monocytes Absolute: 0.5 10*3/uL (ref 0.1–1.0)
Monocytes Relative: 9 %
Neutro Abs: 2.2 10*3/uL (ref 1.7–7.7)
Neutrophils Relative %: 37 %
Platelets: 224 10*3/uL (ref 150–400)
RBC: 5.16 MIL/uL — ABNORMAL HIGH (ref 3.87–5.11)
RDW: 15.3 % (ref 11.5–15.5)
WBC: 5.9 10*3/uL (ref 4.0–10.5)
nRBC: 0 % (ref 0.0–0.2)

## 2021-06-29 LAB — COMPREHENSIVE METABOLIC PANEL
ALT: 22 U/L (ref 0–44)
AST: 30 U/L (ref 15–41)
Albumin: 4.2 g/dL (ref 3.5–5.0)
Alkaline Phosphatase: 100 U/L (ref 38–126)
Anion gap: 8 (ref 5–15)
BUN: 14 mg/dL (ref 8–23)
CO2: 25 mmol/L (ref 22–32)
Calcium: 10.1 mg/dL (ref 8.9–10.3)
Chloride: 108 mmol/L (ref 98–111)
Creatinine, Ser: 1.05 mg/dL — ABNORMAL HIGH (ref 0.44–1.00)
GFR, Estimated: 52 mL/min — ABNORMAL LOW (ref >60)
Glucose, Bld: 146 mg/dL — ABNORMAL HIGH (ref 70–99)
Potassium: 3.7 mmol/L (ref 3.5–5.1)
Sodium: 141 mmol/L (ref 135–145)
Total Bilirubin: 0.9 mg/dL (ref 0.3–1.2)
Total Protein: 7.8 g/dL (ref 6.5–8.1)

## 2021-06-29 MED ORDER — HYDROCODONE-ACETAMINOPHEN 5-325 MG PO TABS
1.0000 | ORAL_TABLET | Freq: Once | ORAL | Status: AC
  Administered 2021-06-29: 1 via ORAL
  Filled 2021-06-29: qty 1

## 2021-06-29 NOTE — Discharge Instructions (Addendum)
Please continue to take Eliquis.  It is important you do not miss or skip any doses.  Please take Tylenol regularly for pain.  I would like for you to follow-up with your primary care provider for further evaluation.  Please return to the emergency department for any worsening symptoms you might have.

## 2021-06-29 NOTE — ED Provider Notes (Signed)
[facility] DEPT Provider Note   CSN: [id number] Arrival date & time: 06/29/21  1807     History Chief Complaint  Patient presents with   Leg Pain    Kathleen Pacheco is a 86 y.o. female who presents to the emergency department today for further evaluation of right-sided leg pain.  Patient was recently diagnosed with a blood clot and she is on Eliquis.  She states pain is primarily in the right thigh.  She denies any chest pain or shortness of breath.  No fever or chills.   Leg Pain      Home Medications Prior to Admission medications   Medication Sig Start Date End Date Taking? Authorizing Provider  albuterol (VENTOLIN HFA) 108 (90 Base) MCG/ACT inhaler INHALE 2 PUFFS INTO THE LUNGS EVERY 4 HOURS AS NEEDED FOR WHEEZING, SHORTNESS OF BREATH, OR COUGH 11/30/18   Forrest Moron, MD  Alpha-D-Galactosidase [facility]) TABS Take 2 tablets by mouth every 6 (six) hours as needed (for gas).    [provider]  amLODipine (NORVASC) 5 MG tablet Take 5 mg by mouth daily.    [provider]  amoxicillin-clavulanate (AUGMENTIN) 875-125 MG tablet Take 1 tablet by mouth every 12 (twelve) hours. 04/18/21   Petrucelli, Aldona Bar R, PA-C  APIXABAN (ELIQUIS) VTE STARTER PACK (10MG AND 5MG) Take as directed on package: start with two-38m tablets twice daily for 7 days. On day 8, switch to one-537mtablet twice daily. 06/22/21   GoSherwood GamblerMD  atorvastatin (LIPITOR) 40 MG tablet Take 40 mg by mouth daily.    [provider]  azithromycin (ZITHROMAX) 250 MG tablet Take 2 tablets on first day, then take 1 daily. Finish entire supply. 05/31/19   MoMaximiano CossNP  B Complex-C (B-COMPLEX WITH VITAMIN C) tablet Take 1 tablet by mouth daily.    [provider]  beclomethasone (QVAR REDIHALER) 80 MCG/ACT inhaler Inhale 2 puffs in the morning and 1 puff in the evening. 11/30/18   StForrest MoronMD  benzonatate (TESSALON) 100 MG capsule Take 1  capsule (100 mg total) by mouth 2 (two) times daily as needed for cough. 05/31/19   MoMaximiano CossNP  cetirizine (ZYRTEC) 10 MG tablet Take 1 tablet (10 mg total) by mouth daily as needed for allergies. 11/26/17   WiTenna Delaine, PA-C  docusate sodium (COLACE) 100 MG capsule Take 1 capsule (100 mg total) by mouth 2 (two) times daily as needed for mild constipation. 04/18/21   Petrucelli, SaAldona Bar, PA-C  ezetimibe (ZETIA) 10 MG tablet ezetimibe 10 mg tablet    [provider]  fluticasone (FLONASE) 50 MCG/ACT nasal spray Place 2 sprays into both nostrils daily. 05/31/19   MoMaximiano CossNP  hydrochlorothiazide (HYDRODIURIL) 25 MG tablet Take 25 mg by mouth daily.    [provider]  Lidocaine-Hydrocortisone Ace 3-0.5 % KIT App;y to anal area or by applicator into rectum every 12 hours as needed for pain. 04/18/21   Petrucelli, SaGlynda JaegerPA-C  linaclotide (LINZESS) 145 MCG CAPS capsule Take 1 capsule (145 mcg total) by mouth daily before breakfast. 05/10/19   StDelia Chimes, MD  montelukast (SINGULAIR) 10 MG tablet TAKE 1 TABLET BY MOUTH EVERYDAY AT BEDTIME 02/22/19   StForrest MoronMD  Multiple Vitamins-Minerals (MULTIVITAMIN GUMMIES ADULT) CHEW Chew 3 each by mouth daily.    [provider]  polyethylene glycol (MIRALAX) 17 g packet Take 17 g by mouth daily as needed for moderate constipation. 04/18/21  Petrucelli, Samantha R, PA-C  potassium chloride SA (K-DUR,KLOR-CON) 20 MEQ tablet Take 20 mEq by mouth daily.    [provider]  traMADol (ULTRAM) 50 MG tablet Take 50 mg by mouth every 6 (six) hours as needed for moderate pain.    [provider]  triamcinolone cream (KENALOG) 0.1 % APPLY TO AFFECTED AREA TWICE A DAY 02/25/19   Delia Chimes A, MD  TURMERIC PO Take 5 mLs by mouth daily.    [provider]  Grant Ruts INHUB 250-50 MCG/DOSE AEPB TAKE 1 PUFF BY MOUTH TWICE A DAY 09/29/19   Maximiano Coss, NP      Allergies    Ace  inhibitors, Aspartame, Other, and Penicillins    Review of Systems   Review of Systems  All other systems reviewed and are negative.   Physical Exam Updated Vital Signs BP (!) 144/78 (BP Location: Left Arm)   Pulse 60   Temp 98.3 F (36.8 C) (Oral)   Resp 17   SpO2 98%  Physical Exam Vitals and nursing note reviewed.  Constitutional:      General: She is not in acute distress.    Appearance: Normal appearance.  HENT:     Head: Normocephalic and atraumatic.  Eyes:     General:        Right eye: No discharge.        Left eye: No discharge.  Cardiovascular:     Comments: Regular rate and rhythm.  S1/S2 are distinct without any evidence of murmur, rubs, or gallops.  Radial pulses are 2+ bilaterally.  Dorsalis pedis pulses are 2+ bilaterally.  No evidence of pedal edema. Pulmonary:     Comments: Clear to auscultation bilaterally.  Normal effort.  No respiratory distress.  No evidence of wheezes, rales, or rhonchi heard throughout. Abdominal:     General: Abdomen is flat. Bowel sounds are normal. There is no distension.     Tenderness: There is no abdominal tenderness. There is no guarding or rebound.  Musculoskeletal:        General: Normal range of motion.     Cervical back: Neck supple.     Comments: Right lower extremity is mildly swollen but does not show any evidence of pitting edema.  Compartments are soft in the lower extremity.  2+ dorsalis pedis pulse felt on the right.  Skin:    General: Skin is warm and dry.     Findings: No rash.  Neurological:     General: No focal deficit present.     Mental Status: She is alert.  Psychiatric:        Mood and Affect: Mood normal.        Behavior: Behavior normal.     ED Results / Procedures / Treatments   Labs (all labs ordered are listed, but only abnormal results are displayed) Labs Reviewed  CBC WITH DIFFERENTIAL/PLATELET - Abnormal; Notable for the following components:      Result Value   RBC 5.16 (*)    All other  components within normal limits  COMPREHENSIVE METABOLIC PANEL - Abnormal; Notable for the following components:   Glucose, Bld 146 (*)    Creatinine, Ser 1.05 (*)    GFR, Estimated 52 (*)    All other components within normal limits    EKG None  Radiology No results found.  Procedures Procedures    Medications Ordered in ED Medications  HYDROcodone-acetaminophen (NORCO/VICODIN) 5-325 MG per tablet 1 tablet (1 tablet Oral Given 06/29/21 1934)  ED Course/ Medical Decision Making/ A&P                           Medical Decision Making Mylani Gentry Nemitz is a 86 y.o. female patient who presents to the emergency department today for further evaluation of right leg pain.  This is likely secondary to the diagnosis of blood clot that she has.  I cannot find records of the ultrasound diagnosed with a blood clot but I do see that she is on Eliquis.  She has not missed any doses.  There is no evidence of cerulea dolens or cerulean alba.  Compartments are soft and have a low suspicion for compartment syndrome at this time.  There is a strong 2+ dorsalis pedis pulse felt on the right leg.  Leg is not warm to palpation or significantly indurated.  I doubt overlying cellulitis.  Given her pain medication here and will instructed her to take Tylenol more regularly at home.  I will have her follow-up with her PCP for further evaluation.  She was encouraged to return to the emergency permit for any worsening symptoms. She is    Amount and/or Complexity of Data Reviewed Labs: ordered.  Risk Prescription drug management.    Final Clinical Impression(s) / ED Diagnoses Final diagnoses:  Right leg pain  Acute deep vein thrombosis (DVT) of proximal vein of right lower extremity [facility])    Rx / DC Orders ED Discharge Orders     None         Hendricks Limes, PA-C 06/29/21 2034    Lorelle Gibbs, DO 06/29/21 2344

## 2021-06-29 NOTE — ED Triage Notes (Signed)
Pt BIB EMS from home. Pt has known blood clot in right thigh for about a week. Pt reports increased pain to right leg. Pt was ambulatory with EMS. A&O x4.    140/80 HR 80 98% RA

## 2021-10-08 ENCOUNTER — Ambulatory Visit
Admission: RE | Admit: 2021-10-08 | Discharge: 2021-10-08 | Disposition: A | Discharge status: Home / Self Care | Payer: Medicare Other | Source: Ambulatory Visit | Attending: Registered Nurse | Admitting: Registered Nurse

## 2021-10-08 ENCOUNTER — Other Ambulatory Visit: Payer: Self-pay | Admitting: Registered Nurse

## 2021-10-08 DIAGNOSIS — M898X1 Other specified disorders of bone, shoulder: Secondary | ICD-10-CM

## 2021-10-09 ENCOUNTER — Other Ambulatory Visit: Payer: Self-pay | Admitting: Obstetrics and Gynecology

## 2021-10-09 DIAGNOSIS — Z1231 Encounter for screening mammogram for malignant neoplasm of breast: Secondary | ICD-10-CM

## 2021-11-12 ENCOUNTER — Ambulatory Visit
Admission: RE | Admit: 2021-11-12 | Discharge: 2021-11-12 | Disposition: A | Discharge status: Home / Self Care | Payer: Medicare Other | Source: Ambulatory Visit | Attending: Obstetrics and Gynecology | Admitting: Obstetrics and Gynecology

## 2021-11-12 DIAGNOSIS — Z1231 Encounter for screening mammogram for malignant neoplasm of breast: Secondary | ICD-10-CM

## 2021-11-15 ENCOUNTER — Other Ambulatory Visit: Payer: Self-pay | Admitting: Obstetrics and Gynecology

## 2021-11-15 DIAGNOSIS — R928 Other abnormal and inconclusive findings on diagnostic imaging of breast: Secondary | ICD-10-CM

## 2021-11-28 ENCOUNTER — Ambulatory Visit
Admission: RE | Admit: 2021-11-28 | Discharge: 2021-11-28 | Disposition: A | Discharge status: Home / Self Care | Payer: Medicare Other | Source: Ambulatory Visit | Attending: Obstetrics and Gynecology | Admitting: Obstetrics and Gynecology

## 2021-11-28 DIAGNOSIS — R928 Other abnormal and inconclusive findings on diagnostic imaging of breast: Secondary | ICD-10-CM

## 2021-12-17 ENCOUNTER — Ambulatory Visit: Payer: Medicare Other | Attending: Cardiovascular Disease | Admitting: Cardiovascular Disease

## 2021-12-17 ENCOUNTER — Telehealth (HOSPITAL_COMMUNITY): Payer: Self-pay

## 2021-12-17 ENCOUNTER — Encounter: Payer: Self-pay | Admitting: Cardiovascular Disease

## 2021-12-17 VITALS — BP 114/72 | HR 61 | Ht 63.0 in | Wt 192.0 lb

## 2021-12-17 DIAGNOSIS — I10 Essential (primary) hypertension: Secondary | ICD-10-CM

## 2021-12-17 DIAGNOSIS — Z86718 Personal history of other venous thrombosis and embolism: Secondary | ICD-10-CM

## 2021-12-17 DIAGNOSIS — R079 Chest pain, unspecified: Secondary | ICD-10-CM

## 2021-12-17 DIAGNOSIS — R600 Localized edema: Secondary | ICD-10-CM

## 2021-12-17 DIAGNOSIS — I5189 Other ill-defined heart diseases: Secondary | ICD-10-CM

## 2021-12-17 DIAGNOSIS — R0602 Shortness of breath: Secondary | ICD-10-CM

## 2021-12-17 MED ORDER — CHLORTHALIDONE 25 MG PO TABS: 25.0000 mg | ORAL_TABLET | Freq: Every day | ORAL | 3 refills | Status: DC

## 2021-12-17 NOTE — Telephone Encounter (Signed)
Detailed instructions left on the patient's answering machine. Asked to call back with any questions. S.Shreena Baines EMTP/CCT 

## 2021-12-17 NOTE — Progress Notes (Signed)
Cardiology Office Note    Date:  12/29/2021   ID:  Kathleen Pacheco, DOB 05/30/1935, MRN [id number]  PCP:  System, Provider Not In  Cardiologist:  Shelva Majestic, MD   New cardiology evaluation referred by Kathleen Holms, NP at [facility] for evaluation of chest pain prior to traveling and moved to [place].  Chief Complaint  Patient presents with   New Patient (Initial Visit)   Headache    Bending.   Shortness of Breath   Fatigue    History of Present Illness:  Kathleen Pacheco is a 86 y.o. female who remotely had seen Dr. Terrence Dupont.  She she has recently been evaluated at [facility].  She has a history of hypertension for over 40 years, recent DVT of right lower extremity, aortic atherosclerosis, chronic bronchitis, and was felt to have diastolic heart failure.  She had been traveling on an airplane.  Upon her return on June 21, 2021 she underwent a lower extremity Doppler study with complaints of right leg swelling and was found to have acute DVT involving the right posterior tibial vein.  Left lower extremity was normal.  She has been treated with Eliquis 5 mg twice a day for 3 months and most recently is taking 5 mg daily.  He has a history of hypertension and is on amlodipine 5 mg, hydrochlorothiazide 25 mg.  Apparently an echocardiogram in 2010 showed EF 55 to 60% with mild LVH.  She recently had experienced heart palpitations and dizzy spells and apparently a Zio patch monitor was ordered at [employer].  She admits to decreased energy for several months and shortness of breath with walking.  She will be traveling to [place] and before the end of the month will be moving to [place] to live with one of her children.  She is referred today prior to travel for further assessment and evaluation and consideration for stress testing with her chest discomfort.   Past Medical History:  Diagnosis Date   Bradycardia    Gout    Hx of bladder infections    Hyperlipidemia     Hypertension    Migraine    Thyroid disease    Vertigo     Past Surgical History:  Procedure Laterality Date   ABDOMINAL HYSTERECTOMY     CHOLECYSTECTOMY     GALLBLADDER SURGERY     PARATHYROIDECTOMY      Current Medications: Outpatient Medications Prior to Visit  Medication Sig Dispense Refill   albuterol (VENTOLIN HFA) 108 (90 Base) MCG/ACT inhaler INHALE 2 PUFFS INTO THE LUNGS EVERY 4 HOURS AS NEEDED FOR WHEEZING, SHORTNESS OF BREATH, OR COUGH 18 g 1   Alpha-D-Galactosidase (BEANO) TABS Take 2 tablets by mouth every 6 (six) hours as needed (for gas).     amLODipine (NORVASC) 5 MG tablet Take 5 mg by mouth daily.     apixaban (ELIQUIS) 5 MG TABS tablet Take 5 mg by mouth daily.     atorvastatin (LIPITOR) 40 MG tablet Take 40 mg by mouth daily.     azithromycin (ZITHROMAX) 250 MG tablet Take 2 tablets on first day, then take 1 daily. Finish entire supply. 6 tablet 0   B Complex-C (B-COMPLEX WITH VITAMIN C) tablet Take 1 tablet by mouth daily.     beclomethasone (QVAR REDIHALER) 80 MCG/ACT inhaler Inhale 2 puffs in the morning and 1 puff in the evening. 10.6 g 2   benzonatate (TESSALON) 100 MG capsule  Take 1 capsule (100 mg total) by mouth 2 (two) times daily as needed for cough. 20 capsule 0   cetirizine (ZYRTEC) 10 MG tablet Take 1 tablet (10 mg total) by mouth daily as needed for allergies. 90 tablet 3   docusate sodium (COLACE) 100 MG capsule Take 1 capsule (100 mg total) by mouth 2 (two) times daily as needed for mild constipation. 15 capsule 0   ezetimibe (ZETIA) 10 MG tablet ezetimibe 10 mg tablet     fluticasone (FLONASE) 50 MCG/ACT nasal spray Place 2 sprays into both nostrils daily. 16 g 6   Lidocaine-Hydrocortisone Ace 3-0.5 % KIT App;y to anal area or by applicator into rectum every 12 hours as needed for pain. 1 kit 0   Multiple Vitamins-Minerals (MULTIVITAMIN GUMMIES ADULT) CHEW Chew 3 each by mouth daily.     potassium chloride SA (K-DUR,KLOR-CON) 20 MEQ tablet Take 20  mEq by mouth daily.     traMADol (ULTRAM) 50 MG tablet Take 50 mg by mouth every 6 (six) hours as needed for moderate pain.     triamcinolone cream (KENALOG) 0.1 % APPLY TO AFFECTED AREA TWICE A DAY 30 g 0   TURMERIC PO Take 5 mLs by mouth daily.     WIXELA INHUB 250-50 MCG/DOSE AEPB TAKE 1 PUFF BY MOUTH TWICE A DAY 180 each 1   amoxicillin-clavulanate (AUGMENTIN) 875-125 MG tablet Take 1 tablet by mouth every 12 (twelve) hours. 14 tablet 0   APIXABAN (ELIQUIS) VTE STARTER PACK (10MG AND 5MG) Take as directed on package: start with two-80m tablets twice daily for 7 days. On day 8, switch to one-579mtablet twice daily. 1 each 0   hydrochlorothiazide (HYDRODIURIL) 25 MG tablet Take 25 mg by mouth daily.     linaclotide (LINZESS) 145 MCG CAPS capsule Take 1 capsule (145 mcg total) by mouth daily before breakfast. 30 capsule 1   montelukast (SINGULAIR) 10 MG tablet TAKE 1 TABLET BY MOUTH EVERYDAY AT BEDTIME 90 tablet 0   polyethylene glycol (MIRALAX) 17 g packet Take 17 g by mouth daily as needed for moderate constipation. 21 each 0   No facility-administered medications prior to visit.     Allergies:   Ace inhibitors, Aspartame, Other, and Penicillins   Social History   Socioeconomic History   Marital status: Divorced    Spouse name: Not on file   Number of children: 3   Years of education: Not on file   Highest education level: Not on file  Occupational History   Not on file  Tobacco Use   Smoking status: Never   Smokeless tobacco: Never  Vaping Use   Vaping Use: Never used  Substance and Sexual Activity   Alcohol use: Yes    Alcohol/week: 1.0 standard drink of alcohol    Types: 1 Glasses of wine per week   Drug use: No   Sexual activity: Not Currently    Birth control/protection: Surgical    Comment: HYST   Other Topics Concern   Not on file  Social History Narrative   Diet?      Do you drink/eat things with caffeine? Cup of macho- 1/week      Marital status?                 divorced                    What year were you married? 1964      Do you live in a house,  apartment, assisted living, condo, trailer, etc.? house      Is it one or more stories? one      How many persons live in your home? 1       Do you have any pets in your home? (please list) no      Current or past profession: Retired Pharmacist, hospital       Do you exercise? Work sometimes                                     Type & how often?      Do you have a living will? no      Do you have a DNR form?   no                               If not, do you want to discuss one? no      Do you have signed POA/HPOA for forms?    Social Determinants of Health   Financial Resource Strain: Not on file  Food Insecurity: Not on file  Transportation Needs: Not on file  Physical Activity: Not on file  Stress: Not on file  Social Connections: Not on file    Socially she is divorced for 20 years.  She has 3 children and 5 grandchildren.  She previously worked as a Pharmacist, hospital.  She drinks wine occasionally.  Family History:  The patient's family history includes Breast cancer in her maternal grandmother and paternal aunt.   ROS General: Negative; No fevers, chills, or night sweats;  HEENT: Negative; No changes in vision or hearing, sinus congestion, difficulty swallowing Pulmonary: Negative; No cough, wheezing, shortness of breath, hemoptysis Cardiovascular: See HPI GI: Negative; No nausea, vomiting, diarrhea, or abdominal pain GU: Negative; No dysuria, hematuria, or difficulty voiding Musculoskeletal: Negative; no myalgias, joint pain, or weakness Hematologic/Oncology: Negative; no easy bruising, bleeding Endocrine:  no heat/cold intolerance; no diabetes Neuro: Negative; no changes in balance, headaches Skin: Negative; No rashes or skin lesions Psychiatric: Negative; No behavioral problems, depression Sleep: Negative; No snoring, daytime sleepiness, hypersomnolence, bruxism, restless legs, hypnogognic  hallucinations, no cataplexy Other comprehensive 14 point system review is negative.   PHYSICAL EXAM:   VS:  BP 114/72 (BP Location: Left Arm, Patient Position: Sitting, Cuff Size: Normal)   Pulse 61   Ht _0  (1.6 m)   Wt 192 lb (87.1 kg)   BMI 34.01 kg/m     Repeat blood pressure by me was 120/74  Wt Readings from Last 3 Encounters:  12/18/21 192 lb (87.1 kg)  12/17/21 192 lb (87.1 kg)  05/10/19 205 lb 3.2 oz (93.1 kg)    General: Alert, oriented, no distress.  Skin: normal turgor, no rashes, warm and dry HEENT: Normocephalic, atraumatic. Pupils equal round and reactive to light; sclera anicteric; extraocular muscles intact;  Nose without nasal septal hypertrophy Mouth/Parynx benign; Mallinpatti scale 3 Neck: No JVD, no carotid bruits; normal carotid upstroke Lungs: clear to ausculatation and percussion; no wheezing or rales Chest wall: without tenderness to palpitation Heart: PMI not displaced, RRR, s1 s2 normal, 1/6 systolic murmur, no diastolic murmur, no rubs, gallops, thrills, or heaves Abdomen: soft, nontender; no hepatosplenomehaly, BS+; abdominal aorta nontender and not dilated by palpation. Back: no CVA tenderness Pulses 2+ Musculoskeletal: full range of motion, normal strength, no joint deformities Extremities: No calf tenderness,  1-2+ lower extremity edema; no clubbing cyanosis, Homan's sign negative  Neurologic: grossly nonfocal; Cranial nerves grossly wnl Psychologic: Normal mood and affect   Studies/Labs Reviewed:   December 17, 2021 ECG (independently read by me): NSR at 61, LAE; PRWP anteriorly, no ectopy, nor,mal intervals  Recent Labs:    Latest Ref Rng & Units 06/29/2021    6:42 PM 06/21/2021    3:38 PM 04/17/2021   11:25 PM  BMP  Glucose 70 - 99 mg/dL 146  108  119   BUN 8 - 23 mg/dL _0 Creatinine 0.44 - 1.00 mg/dL 1.05  1.00  0.86   Sodium 135 - 145 mmol/L 141  142  138   Potassium 3.5 - 5.1 mmol/L 3.7  3.2  3.1   Chloride 98 - 111  mmol/L 108  107  108   CO2 22 - 32 mmol/L _1 Calcium 8.9 - 10.3 mg/dL 10.1  10.3  9.2         Latest Ref Rng & Units 06/29/2021    6:42 PM 06/21/2021    3:38 PM 04/17/2021   11:25 PM  Hepatic Function  Total Protein 6.5 - 8.1 g/dL 7.8  7.7  7.0   Albumin 3.5 - 5.0 g/dL 4.2  3.9  3.7   AST 15 - 41 U/L _2 ALT 0 - 44 U/L _3 Alk Phosphatase 38 - 126 U/L 100  101  89   Total Bilirubin 0.3 - 1.2 mg/dL 0.9  0.7  0.3        Latest Ref Rng & Units 06/29/2021    6:42 PM 06/21/2021    3:38 PM 04/17/2021   11:00 PM  CBC  WBC 4.0 - 10.5 K/uL 5.9  5.2  5.1   Hemoglobin 12.0 - 15.0 g/dL 13.7  13.6  13.4   Hematocrit 36.0 - 46.0 % 42.0  41.1  40.8   Platelets 150 - 400 K/uL 224  259  238    Lab Results  Component Value Date   MCV 81.4 06/29/2021   MCV 80.6 06/21/2021   MCV 82.6 04/17/2021   Lab Results  Component Value Date   TSH 1.690 10/15/2016   Lab Results  Component Value Date   HGBA1C 6.0 (H) 10/12/2018     BNP No results found for: "BNP"  ProBNP No results found for: "PROBNP"   Lipid Panel     Component Value Date/Time   CHOL 163 10/12/2018 1218   TRIG 246 (H) 10/12/2018 1218   HDL 47 10/12/2018 1218   CHOLHDL 3.5 10/12/2018 1218   CHOLHDL 3.4 11/17/2015 1220   VLDL 34 (H) 11/17/2015 1220   [place] 76 10/12/2018 1218   LABVLDL 40 10/12/2018 1218     RADIOLOGY: ECHOCARDIOGRAM COMPLETE  Result Date: 12/21/2021    ECHOCARDIOGRAM REPORT   Patient Name:   DALAYNA LAUTER Date of Exam: 12/21/2021 Medical Rec #:  [id number]       Height:       63.0 in Accession #:    [id number]      Weight:       192.0 lb Date of Birth:  1935-11-22       BSA:          1.900 m Patient Age:    32 years        BP:  131/82 mmHg Patient Gender: F               HR:           56 bpm. Exam Location:  Outpatient Procedure: 2D Echo, Cardiac Doppler and Color Doppler Indications:    Chest Pain R07.9  History:        Patient has no prior history of Echocardiogram  examinations.                 Arrythmias:Bradycardia; Risk Factors:Hypertension and                 Dyslipidemia.  Sonographer:    Ronny Flurry Referring Phys: [place]  1. Left ventricular ejection fraction, by estimation, is 60 to 65%. The left ventricle has normal function. The left ventricle has no regional wall motion abnormalities. Left ventricular diastolic parameters are consistent with Grade I diastolic dysfunction (impaired relaxation).  2. Right ventricular systolic function is normal. The right ventricular size is normal.  3. The mitral valve is normal in structure. No evidence of mitral valve regurgitation. No evidence of mitral stenosis.  4. The aortic valve is normal in structure. Aortic valve regurgitation is not visualized. No aortic stenosis is present.  5. The inferior vena cava is normal in size with <50% respiratory variability, suggesting right atrial pressure of 8 mmHg. FINDINGS  Left Ventricle: Left ventricular ejection fraction, by estimation, is 60 to 65%. The left ventricle has normal function. The left ventricle has no regional wall motion abnormalities. The left ventricular internal cavity size was normal in size. There is  no left ventricular hypertrophy. Left ventricular diastolic parameters are consistent with Grade I diastolic dysfunction (impaired relaxation). Right Ventricle: The right ventricular size is normal. No increase in right ventricular wall thickness. Right ventricular systolic function is normal. Left Atrium: Left atrial size was normal in size. Right Atrium: Right atrial size was normal in size. Pericardium: There is no evidence of pericardial effusion. Mitral Valve: The mitral valve is normal in structure. No evidence of mitral valve regurgitation. No evidence of mitral valve stenosis. Tricuspid Valve: The tricuspid valve is normal in structure. Tricuspid valve regurgitation is not demonstrated. No evidence of tricuspid stenosis. Aortic  Valve: The aortic valve is normal in structure. Aortic valve regurgitation is not visualized. No aortic stenosis is present. Aortic valve mean gradient measures 5.0 mmHg. Aortic valve peak gradient measures 9.6 mmHg. Aortic valve area, by VTI measures 2.10 cm. Pulmonic Valve: The pulmonic valve was normal in structure. Pulmonic valve regurgitation is not visualized. No evidence of pulmonic stenosis. Aorta: The aortic root is normal in size and structure. Venous: The inferior vena cava is normal in size with less than 50% respiratory variability, suggesting right atrial pressure of 8 mmHg. IAS/Shunts: No atrial level shunt detected by color flow Doppler.  LEFT VENTRICLE PLAX 2D LVIDd:         4.20 cm   Diastology LVIDs:         2.70 cm   LV e' medial:    5.55 cm/s LV PW:         1.00 cm   LV E/e' medial:  11.5 LV IVS:        1.00 cm   LV e' lateral:   8.92 cm/s LVOT diam:     1.90 cm   LV E/e' lateral: 7.2 LV SV:         70 LV SV Index:   37 LVOT Area:  2.84 cm                           3D Volume EF:                          3D EF:        53 %                          LV EDV:       90 ml                          LV ESV:       42 ml                          LV SV:        48 ml IVC IVC diam: 2.10 cm LEFT ATRIUM           Index        RIGHT ATRIUM           Index LA diam:      2.50 cm 1.32 cm/m   RA Area:     10.80 cm LA Vol (A2C): 15.4 ml 8.10 ml/m   RA Volume:   19.00 ml  10.00 ml/m LA Vol (A4C): 26.7 ml 14.05 ml/m  AORTIC VALVE AV Area (Vmax):    2.20 cm AV Area (Vmean):   2.17 cm AV Area (VTI):     2.10 cm AV Vmax:           155.00 cm/s AV Vmean:          97.400 cm/s AV VTI:            0.335 m AV Peak Grad:      9.6 mmHg AV Mean Grad:      5.0 mmHg LVOT Vmax:         120.00 cm/s LVOT Vmean:        74.600 cm/s LVOT VTI:          0.248 m LVOT/AV VTI ratio: 0.74  AORTA Ao Root diam: 3.10 cm Ao Asc diam:  3.00 cm MITRAL VALVE MV Area (PHT): 2.42 cm    SHUNTS MV Decel Time: 313 msec    Systemic VTI:  0.25 m  MV E velocity: 64.00 cm/s  Systemic Diam: 1.90 cm MV A velocity: 82.10 cm/s MV E/A ratio:  0.78 Skeet Latch MD Electronically signed by Skeet Latch MD Signature Date/Time: 12/21/2021/4:06:54 PM    Final    MYOCARDIAL PERFUSION IMAGING  Result Date: 12/20/2021   The study is normal. The study is low risk.   No ST deviation was noted.   LV perfusion is normal.   Left ventricular function is normal. Nuclear stress EF: 61 %. The left ventricular ejection fraction is normal (55-65%). End diastolic cavity size is normal. End systolic cavity size is normal.   Prior study available for comparison from 12/27/2015. No changes compared to prior study. No reversible ischemia, LVEF 68% Normal perfusion. LVEF 61% with normal wall motion. This is a low risk study. No change compared to prior study in 2017.     Additional studies/ records that were reviewed today include:  I have reviewed the patient's prior lower extremity Doppler study, records of [place] were reviewed  ASSESSMENT:    1. Chest pain, unspecified type   2. History of DVT (deep vein thrombosis): June 2023   3. Primary hypertension   4. Exertional shortness of breath   5. Lower extremity edema   6. Diastolic dysfunction     PLAN:  Ms. 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  Apparently, she took Eliquis 5 mg twice a day for 3 months and most recently is only taking 5 mg daily.  She admits to decreased energy for several months.  She experiences shortness of breath with walking.  She has some episodes of chest discomfort which most likely is musculoskeletal rather than ischemic.  She also had experienced palpitations and apparently a Zio patch monitor was ordered by [employer] which I did not have access to.  Most recent laboratory has shown normal renal  function with a creatinine of 0.94.  With her stable renal function and with upcoming air travel I have recommended she increase Eliquis to the normal dose of 5 mg twice daily since renal function and weight are in the normal range.  Her blood pressure today is stable.  Her ECG shows normal sinus rhythm with poor wave progression.  She will be traveling on an airplane to [place] and then later this month will be moving to [place].  Presently I am scheduling her for 2D echo Doppler study for reassessment of LV systolic and diastolic function we will try to expedite this being done.  With her 1-2+ lower extremity edema and stable blood pressure I have recommended she decrease amlodipine from 10 mg down to 5 mg.  I will discontinue HCTZ and in its place start chlorthalidone 25 mg daily.  I am scheduling her for an [place] study for assessment of her chest pain.  She will be contacted regarding the results of the studies.  She will be moving to [place] in follow-up if necessary will need to be done in [place].   Medication Adjustments/Labs and Tests Ordered: Current medicines are reviewed at length with the patient today.  Concerns regarding medicines are outlined above.  Medication changes, Labs and Tests ordered today are listed in the Patient Instructions below. Patient Instructions  Medication Instructions:  STOP HCTZ START Chlorthalidone 25 mg daily  DECREASE  Amlodipine 5 mg daily TAKE Eliquis 5 mg 2 times a day a prescribed. *If you need a refill on your cardiac medications before your next appointment, please call your pharmacy*   Lab Work: NONE ordered at this time of appointment   If you have labs (blood work) drawn today and your tests are completely normal, you will receive your results only by: Ray (if you have MyChart) OR A paper copy in the mail If you have any lab test that is abnormal or we need to change your treatment, we will call you to review the  results.  Testing/Procedures: Your physician has requested that you have an echocardiogram. Echocardiography is a painless test that uses sound waves to create images of your heart. It provides your doctor with information about the size and shape of your heart and how well your heart's chambers and valves are working. This procedure takes approximately one hour. There are no restrictions for this procedure. Please do NOT wear cologne, perfume, aftershave, or lotions (deodorant is allowed). Please arrive 15 minutes  prior to your appointment time.  Dr. Claiborne Billings has ordered a Myocardial Perfusion Imaging Study.   The test will take approximately 3 to 4 hours to complete; you may bring reading material.  If someone comes with you to your appointment, they will need to remain in the main lobby due to limited space in the testing area. **If you are pregnant or breastfeeding, please notify the nuclear lab prior to your appointment**  You will need to hold the following medications prior to your stress test: beta-blockers (24 hours prior to test)   How to prepare for your Myocardial Perfusion Test: Do not eat or drink 3 hours prior to your test, except you may have water. Do not consume products containing caffeine (regular or decaffeinated) 12 hours prior to your test. (ex: coffee, chocolate, sodas, tea). Do wear comfortable clothes (no dresses or overalls) and walking shoes, tennis shoes preferred (No heels or open toe shoes are allowed). Do NOT wear cologne, perfume, aftershave, or lotions (deodorant is allowed). If these instructions are not followed, your test will have to be rescheduled.   Follow-Up: At [facility], you and your health needs are our priority.  As part of our continuing mission to provide you with exceptional heart care, we have created designated Provider Care Teams.  These Care Teams include your primary Cardiologist (physician) and Advanced Practice Providers (APPs -   Physician Assistants and Nurse Practitioners) who all work together to provide you with the care you need, when you need it.  We recommend signing up for the patient portal called "MyChart".  Sign up information is provided on this After Visit Summary.  MyChart is used to connect with patients for Virtual Visits (Telemedicine).  Patients are able to view lab/test results, encounter notes, upcoming appointments, etc.  Non-urgent messages can be sent to your provider as well.   To learn more about what you can do with MyChart, go to [email or web address].    Your next appointment:   As needed  The format for your next appointment:   In Person  Provider:   Shelva Majestic, MD     Other Instructions  Important Information About Sugar         Signed, Shelva Majestic, MD  12/29/2021 10:39 AM    [facility] [address] [place], [place], [place]  [address] Phone: [phone]

## 2021-12-17 NOTE — Patient Instructions (Signed)
Medication Instructions:  STOP HCTZ START Chlorthalidone 25 mg daily  DECREASE  Amlodipine 5 mg daily TAKE Eliquis 5 mg 2 times a day a prescribed. *If you need a refill on your cardiac medications before your next appointment, please call your pharmacy*   Lab Work: NONE ordered at this time of appointment   If you have labs (blood work) drawn today and your tests are completely normal, you will receive your results only by: Alpha (if you have MyChart) OR A paper copy in the mail If you have any lab test that is abnormal or we need to change your treatment, we will call you to review the results.  Testing/Procedures: Your physician has requested that you have an echocardiogram. Echocardiography is a painless test that uses sound waves to create images of your heart. It provides your doctor with information about the size and shape of your heart and how well your heart's chambers and valves are working. This procedure takes approximately one hour. There are no restrictions for this procedure. Please do NOT wear cologne, perfume, aftershave, or lotions (deodorant is allowed). Please arrive 15 minutes prior to your appointment time.  Dr. Claiborne Billings has ordered a Myocardial Perfusion Imaging Study.   The test will take approximately 3 to 4 hours to complete; you may bring reading material.  If someone comes with you to your appointment, they will need to remain in the main lobby due to limited space in the testing area. **If you are pregnant or breastfeeding, please notify the nuclear lab prior to your appointment**  You will need to hold the following medications prior to your stress test: beta-blockers (24 hours prior to test)   How to prepare for your Myocardial Perfusion Test: Do not eat or drink 3 hours prior to your test, except you may have water. Do not consume products containing caffeine (regular or decaffeinated) 12 hours prior to your test. (ex: coffee, chocolate, sodas,  tea). Do wear comfortable clothes (no dresses or overalls) and walking shoes, tennis shoes preferred (No heels or open toe shoes are allowed). Do NOT wear cologne, perfume, aftershave, or lotions (deodorant is allowed). If these instructions are not followed, your test will have to be rescheduled.   Follow-Up: At [facility], you and your health needs are our priority.  As part of our continuing mission to provide you with exceptional heart care, we have created designated Provider Care Teams.  These Care Teams include your primary Cardiologist (physician) and Advanced Practice Providers (APPs -  Physician Assistants and Nurse Practitioners) who all work together to provide you with the care you need, when you need it.  We recommend signing up for the patient portal called "MyChart".  Sign up information is provided on this After Visit Summary.  MyChart is used to connect with patients for Virtual Visits (Telemedicine).  Patients are able to view lab/test results, encounter notes, upcoming appointments, etc.  Non-urgent messages can be sent to your provider as well.   To learn more about what you can do with MyChart, go to [email or web address].    Your next appointment:   As needed  The format for your next appointment:   In Person  Provider:   Shelva Majestic, MD     Other Instructions  Important Information About Sugar

## 2021-12-18 ENCOUNTER — Encounter (HOSPITAL_COMMUNITY): Payer: Self-pay | Admitting: *Deleted

## 2021-12-18 ENCOUNTER — Ambulatory Visit (HOSPITAL_COMMUNITY): Payer: Medicare Other | Attending: Internal Medicine

## 2021-12-18 DIAGNOSIS — R079 Chest pain, unspecified: Secondary | ICD-10-CM | POA: Insufficient documentation

## 2021-12-18 MED ORDER — TECHNETIUM TC 99M TETROFOSMIN IV KIT
10.7000 | PACK | Freq: Once | INTRAVENOUS | Status: AC | PRN
  Administered 2021-12-18: 10.7 via INTRAVENOUS

## 2021-12-20 ENCOUNTER — Ambulatory Visit (HOSPITAL_COMMUNITY): Payer: Medicare Other

## 2021-12-20 DIAGNOSIS — R079 Chest pain, unspecified: Secondary | ICD-10-CM | POA: Diagnosis present

## 2021-12-20 DIAGNOSIS — I10 Essential (primary) hypertension: Secondary | ICD-10-CM | POA: Diagnosis not present

## 2021-12-20 DIAGNOSIS — R001 Bradycardia, unspecified: Secondary | ICD-10-CM | POA: Diagnosis not present

## 2021-12-20 DIAGNOSIS — E785 Hyperlipidemia, unspecified: Secondary | ICD-10-CM | POA: Diagnosis not present

## 2021-12-20 LAB — MYOCARDIAL PERFUSION IMAGING
LV dias vol: 60 mL (ref 46–106)
LV sys vol: 23 mL
Nuc Stress EF: 61 %
Peak HR: 80 {beats}/min
Rest HR: 66 {beats}/min
Rest Nuclear Isotope Dose: 10.7 mCi
SDS: 0
SRS: 0
SSS: 0
ST Depression (mm): 0 mm
Stress Nuclear Isotope Dose: 33 mCi
TID: 1.05

## 2021-12-20 MED ORDER — TECHNETIUM TC 99M TETROFOSMIN IV KIT
33.0000 | PACK | Freq: Once | INTRAVENOUS | Status: AC | PRN
  Administered 2021-12-20: 33 via INTRAVENOUS

## 2021-12-20 MED ORDER — REGADENOSON 0.4 MG/5ML IV SOLN
0.4000 mg | Freq: Once | INTRAVENOUS | Status: AC
  Administered 2021-12-20: 0.4 mg via INTRAVENOUS

## 2021-12-21 ENCOUNTER — Ambulatory Visit (HOSPITAL_COMMUNITY)
Admission: RE | Admit: 2021-12-21 | Discharge: 2021-12-21 | Disposition: A | Discharge status: Home / Self Care | Payer: Medicare Other | Source: Ambulatory Visit | Attending: Cardiovascular Disease | Admitting: Cardiovascular Disease

## 2021-12-21 DIAGNOSIS — R079 Chest pain, unspecified: Secondary | ICD-10-CM | POA: Diagnosis not present

## 2021-12-21 DIAGNOSIS — E785 Hyperlipidemia, unspecified: Secondary | ICD-10-CM | POA: Insufficient documentation

## 2021-12-21 DIAGNOSIS — R001 Bradycardia, unspecified: Secondary | ICD-10-CM | POA: Insufficient documentation

## 2021-12-21 DIAGNOSIS — I10 Essential (primary) hypertension: Secondary | ICD-10-CM | POA: Insufficient documentation

## 2021-12-21 LAB — ECHOCARDIOGRAM COMPLETE
AR max vel: 2.2 cm2
AV Area VTI: 2.1 cm2
AV Area mean vel: 2.17 cm2
AV Mean grad: 5 mmHg
AV Peak grad: 9.6 mmHg
Ao pk vel: 1.55 m/s
Area-P 1/2: 2.42 cm2
S' Lateral: 2.7 cm

## 2021-12-21 NOTE — Progress Notes (Incomplete)
Echocardiogram 2D Echocardiogram has been performed.  Ronny Flurry 12/21/2021, 2:11 PM

## 2021-12-25 ENCOUNTER — Encounter: Payer: Self-pay | Admitting: *Deleted

## 2022-01-01 ENCOUNTER — Encounter: Payer: Self-pay | Admitting: *Deleted

## 2022-07-17 ENCOUNTER — Telehealth: Payer: Self-pay | Admitting: Cardiovascular Disease

## 2022-07-17 NOTE — Addendum Note (Signed)
Addended by: Lajoyce Lauber on: 07/17/2022 05:17 PM   Modules accepted: Orders

## 2022-07-17 NOTE — Telephone Encounter (Signed)
*  STAT* If patient is at the pharmacy, call can be transferred to refill team.   1. Which medications need to be refilled? (please list name of each medication and dose if known)   chlorthalidone (HYGROTON) 25 MG tablet   2. Which pharmacy/location (including street and city if local pharmacy) is medication to be sent to?  [facility] - [place], [place] - [address]   3. Do they need a 30 day or 90 day supply?  90 day  Patient stated she is completely out of this medication.   Patient stated she is out of town.

## 2022-08-19 ENCOUNTER — Other Ambulatory Visit: Payer: Self-pay | Admitting: Family Medicine

## 2022-08-19 ENCOUNTER — Ambulatory Visit
Admission: RE | Admit: 2022-08-19 | Discharge: 2022-08-19 | Disposition: A | Discharge status: Home / Self Care | Payer: Medicare Other | Source: Ambulatory Visit | Attending: Family Medicine | Admitting: Family Medicine

## 2022-08-19 DIAGNOSIS — R55 Syncope and collapse: Secondary | ICD-10-CM

## 2022-12-16 ENCOUNTER — Other Ambulatory Visit: Payer: Self-pay | Admitting: Cardiovascular Disease

## 2022-12-23 ENCOUNTER — Encounter: Payer: Self-pay | Admitting: Family Medicine

## 2022-12-23 ENCOUNTER — Other Ambulatory Visit: Payer: Self-pay | Admitting: Family Medicine

## 2022-12-23 ENCOUNTER — Ambulatory Visit
Admission: RE | Admit: 2022-12-23 | Discharge: 2022-12-23 | Disposition: A | Discharge status: Home / Self Care | Payer: Medicare Other | Source: Ambulatory Visit | Attending: Family Medicine | Admitting: Family Medicine

## 2022-12-23 DIAGNOSIS — Z1231 Encounter for screening mammogram for malignant neoplasm of breast: Secondary | ICD-10-CM

## 2022-12-23 DIAGNOSIS — Z13820 Encounter for screening for osteoporosis: Secondary | ICD-10-CM

## 2023-04-08 ENCOUNTER — Other Ambulatory Visit: Payer: Self-pay

## 2023-04-15 ENCOUNTER — Other Ambulatory Visit

## 2023-04-17 ENCOUNTER — Ambulatory Visit (INDEPENDENT_AMBULATORY_CARE_PROVIDER_SITE_OTHER): Admitting: "Endocrinology

## 2023-04-17 ENCOUNTER — Encounter: Payer: Self-pay | Admitting: "Endocrinology

## 2023-04-17 DIAGNOSIS — E041 Nontoxic single thyroid nodule: Secondary | ICD-10-CM

## 2023-04-17 DIAGNOSIS — Z13820 Encounter for screening for osteoporosis: Secondary | ICD-10-CM

## 2023-04-17 NOTE — Progress Notes (Signed)
 Outpatient Endocrinology Note Kathleen La Luz, MD    Kathleen Pacheco 1935/06/23 [id number]  Referring Provider: Ellyn Hack, MD Primary Care Provider: System, Provider Not In Reason for consultation: Subjective   Assessment & Plan  Diagnoses and all orders for this visit:  Hypercalcemia  Thyroid nodule -     US THYROID; Future -     US THYROID -     TSH(Reflex)  Patient is visiting from [place], to seek endocrinology care Patient is referred for hypercalcemia Reviewed previous labs: 12/27/2022 showed corrected calcium of 10.7 with PTH at 55, GFR 58, 24-hour urine calcium 159/24-hour Ordered repeat labs DEXA scan ordered by primary care, will follow-up results  Thyroid ultrasound could not be found Per records patient also has a history of thyroid nodule in the left thyroid lobe, and due to size surgery was referred urgently, however patient has not undergone any surgery or FNA at this point Ordered thyroid ultrasound Ordered baseline labs  Return in about 2 weeks (around 05/01/2023).   I have reviewed current medications, nurse's notes, allergies, vital signs, past medical and surgical history, family medical history, and social history for this encounter. Counseled patient on symptoms, examination findings, lab findings, imaging results, treatment decisions and monitoring and prognosis. The patient understood the recommendations and agrees with the treatment plan. All questions regarding treatment plan were fully answered.  Kathleen Wyaconda, MD  04/17/23   History of Present Illness HPI  Kathleen Pacheco is a 88 y.o. female referred by Dr. Sherryll Burger for evaluation and management of hypercalcemia.    Patient denies a history of kidney stones. History of Parathyroidectomy around 40 years ago in [facility]-no cancer per pt, records not found   She  current hematuria No polyuria No nocturia Yes thirst No renal failure No anorexia No abdominal pain No heartburn  No constipation Yes nausea or vomiting No history of peptic ulcer disease No depression No confusion No excessive fatigue Yes fracture No osteoporosis No headaches No numbness No tingling No  She takes Calcium No She takes Vitamin D supplements No  She a history of taking chronic lithium No She a recent history of thiazide diuretic intake No  She family history of renal stones/hypercalcemia Yes, daughter had kidney stones  a personal history of MEN syndromes/medullary thyroid cancer/ pheochromocytoma No    Physical Exam  BP 110/80   Pulse 87   Ht 5\' 3"  (1.6 m)   Wt 161 lb (73 kg)   SpO2 98%   BMI 28.52 kg/m    Constitutional: well developed, well nourished Head: normocephalic, atraumatic Eyes: sclera anicteric, no redness Neck: supple, no thyromegaly/nodule appreciated Lungs: normal respiratory effort Neurology: alert and oriented Skin: dry, no appreciable rashes Musculoskeletal: no appreciable defects Psychiatric: normal mood and affect   Current Medications Patient's Medications  New Prescriptions   No medications on file  Previous Medications   ALBUTEROL (VENTOLIN HFA) 108 (90 BASE) MCG/ACT INHALER    INHALE 2 PUFFS INTO THE LUNGS EVERY 4 HOURS AS NEEDED FOR WHEEZING, SHORTNESS OF BREATH, OR COUGH   ALPHA-D-GALACTOSIDASE (BEANO) TABS    Take 2 tablets by mouth every 6 (six) hours as needed (for gas).   AMLODIPINE (NORVASC) 5 MG TABLET    Take 5 mg by mouth daily.   APIXABAN (ELIQUIS) 5 MG TABS TABLET    Take 5 mg by mouth daily.   ATORVASTATIN (LIPITOR) 40 MG TABLET    Take 40 mg by mouth daily.   AZITHROMYCIN (  ZITHROMAX) 250 MG TABLET    Take 2 tablets on first day, then take 1 daily. Finish entire supply.   B COMPLEX-C (B-COMPLEX WITH VITAMIN C) TABLET    Take 1 tablet by mouth daily.   BECLOMETHASONE (QVAR REDIHALER) 80 MCG/ACT INHALER    Inhale 2 puffs in the morning and 1 puff in the evening.   BENZONATATE (TESSALON) 100 MG CAPSULE    Take 1 capsule  (100 mg total) by mouth 2 (two) times daily as needed for cough.   CETIRIZINE (ZYRTEC) 10 MG TABLET    Take 1 tablet (10 mg total) by mouth daily as needed for allergies.   CHLORTHALIDONE (HYGROTON) 25 MG TABLET    Take 1 tablet by mouth once daily   DOCUSATE SODIUM (COLACE) 100 MG CAPSULE    Take 1 capsule (100 mg total) by mouth 2 (two) times daily as needed for mild constipation.   EZETIMIBE (ZETIA) 10 MG TABLET    ezetimibe 10 mg tablet   FLUTICASONE (FLONASE) 50 MCG/ACT NASAL SPRAY    Place 2 sprays into both nostrils daily.   LIDOCAINE-HYDROCORTISONE ACE 3-0.5 % KIT    App;y to anal area or by applicator into rectum every 12 hours as needed for pain.   MULTIPLE VITAMINS-MINERALS (MULTIVITAMIN GUMMIES ADULT) CHEW    Chew 3 each by mouth daily.   POTASSIUM CHLORIDE SA (K-DUR,KLOR-CON) 20 MEQ TABLET    Take 20 mEq by mouth daily.   TRAMADOL (ULTRAM) 50 MG TABLET    Take 50 mg by mouth every 6 (six) hours as needed for moderate pain.   TRIAMCINOLONE CREAM (KENALOG) 0.1 %    APPLY TO AFFECTED AREA TWICE A DAY   TURMERIC PO    Take 5 mLs by mouth daily.   WIXELA INHUB 250-50 MCG/DOSE AEPB    TAKE 1 PUFF BY MOUTH TWICE A DAY  Modified Medications   No medications on file  Discontinued Medications   No medications on file    Allergies Allergies  Allergen Reactions   Ace Inhibitors Other (See Comments)    Reaction:  Unknown    Aspartame    Other Swelling and Other (See Comments)    Pt states that she is allergic to artificial sweeteners.   Reaction:  Facial swelling    Penicillins Other (See Comments)    Reaction:  Yeast infection  Has patient had a PCN reaction causing immediate rash, facial/tongue/throat swelling, SOB or lightheadedness with hypotension: No Has patient had a PCN reaction causing severe rash involving mucus membranes or skin necrosis: No Has patient had a PCN reaction that required hospitalization No Has patient had a PCN reaction occurring within the last 10 years:  No If all of the above answers are "NO", then may proceed with Cephalosporin use.    Past Medical History Past Medical History:  Diagnosis Date   Bradycardia    Gout    Hx of bladder infections    Hyperlipidemia    Hypertension    Migraine    Thyroid disease    Vertigo     Past Surgical History Past Surgical History:  Procedure Laterality Date   ABDOMINAL HYSTERECTOMY     CHOLECYSTECTOMY     GALLBLADDER SURGERY     PARATHYROIDECTOMY      Family History family history includes Breast cancer in her maternal grandmother and paternal aunt.  Social History Social History   Socioeconomic History   Marital status: Divorced    Spouse name: Not on file  Number of children: 3   Years of education: Not on file   Highest education level: Not on file  Occupational History   Not on file  Tobacco Use   Smoking status: Never   Smokeless tobacco: Never  Vaping Use   Vaping status: Never Used  Substance and Sexual Activity   Alcohol use: Yes    Alcohol/week: 1.0 standard drink of alcohol    Types: 1 Glasses of wine per week   Drug use: No   Sexual activity: Not Currently    Birth control/protection: Surgical    Comment: HYST   Other Topics Concern   Not on file  Social History Narrative   Diet?      Do you drink/eat things with caffeine? Cup of macho- 1/week      Marital status?                divorced                    What year were you married? 1964      Do you live in a house, apartment, assisted living, condo, trailer, etc.? house      Is it one or more stories? one      How many persons live in your home? 1       Do you have any pets in your home? (please list) no      Current or past profession: Retired Runner, broadcasting/film/video       Do you exercise? Work sometimes                                     Type & how often?      Do you have a living will? no      Do you have a DNR form?   no                               If not, do you want to discuss one? no      Do you  have signed POA/HPOA for forms?    Social Drivers of Corporate investment banker Strain: Not on file  Food Insecurity: Low Risk  (04/10/2022)   Received from [facility] and [facility] [place], [address] and [place] and [place]   IP Discharge Planning Review    In the past 12 months have you experienced difficulty with any of the following?: No difficulties reported  Transportation Needs: Low Risk  (04/10/2022)   Received from [facility] and [facility] [place], [address] and [place] and [place]   IP Discharge Planning Review    In the past 12 months have you experienced difficulty with any of the following?: No difficulties reported  Physical Activity: Not on file  Stress: Not on file  Social Connections: Not on file  Intimate Partner Violence: Not on file    Lab Results  Component Value Date   CHOL 163 10/12/2018   Lab Results  Component Value Date   HDL 47 10/12/2018   Lab Results  Component Value Date   LDLCALC 76 10/12/2018   Lab Results  Component Value Date   TRIG 246 (H) 10/12/2018   Lab Results  Component Value Date   CHOLHDL 3.5 10/12/2018   Lab Results  Component Value Date   CREATININE  1.05 (H) 06/29/2021   Lab Results  Component Value Date   GFR 80.23 10/22/2013      Component Value Date/Time   NA 141 06/29/2021 1842   NA 137 10/12/2018 1218   K 3.7 06/29/2021 1842   CL 108 06/29/2021 1842   CO2 25 06/29/2021 1842   GLUCOSE 146 (H) 06/29/2021 1842   BUN 14 06/29/2021 1842   BUN 9 10/12/2018 1218   CREATININE 1.05 (H) 06/29/2021 1842   CREATININE 0.91 10/16/2014 1605   CALCIUM 10.1 06/29/2021 1842   PROT 7.8 06/29/2021 1842   PROT 7.2 10/12/2018 1218   ALBUMIN 4.2 06/29/2021 1842   ALBUMIN 4.3 10/12/2018 1218   AST 30 06/29/2021 1842   ALT 22 06/29/2021 1842   ALKPHOS 100 06/29/2021 1842   BILITOT 0.9 06/29/2021 1842   BILITOT 0.4 10/12/2018 1218   GFRNONAA 52 (L)  06/29/2021 1842   GFRAA 61 10/12/2018 1218      Latest Ref Rng & Units 06/29/2021    6:42 PM 06/21/2021    3:38 PM 04/17/2021   11:25 PM  BMP  Glucose 70 - 99 mg/dL 027  253  664   BUN 8 - 23 mg/dL 14  12  12    Creatinine 0.44 - 1.00 mg/dL 4.03  4.74  2.59   Sodium 135 - 145 mmol/L 141  142  138   Potassium 3.5 - 5.1 mmol/L 3.7  3.2  3.1   Chloride 98 - 111 mmol/L 108  107  108   CO2 22 - 32 mmol/L 25  27  26    Calcium 8.9 - 10.3 mg/dL 56.3  87.5  9.2        Component Value Date/Time   WBC 5.9 06/29/2021 1842   RBC 5.16 (H) 06/29/2021 1842   HGB 13.7 06/29/2021 1842   HGB 14.1 11/14/2016 1144   HCT 42.0 06/29/2021 1842   HCT 44.2 11/14/2016 1144   PLT 224 06/29/2021 1842   PLT 235 11/14/2016 1144   MCV 81.4 06/29/2021 1842   MCV 81.8 05/09/2017 1747   MCV 84 11/14/2016 1144   MCH 26.6 06/29/2021 1842   MCHC 32.6 06/29/2021 1842   RDW 15.3 06/29/2021 1842   RDW 15.2 11/14/2016 1144   LYMPHSABS 2.8 06/29/2021 1842   LYMPHSABS 2.4 11/14/2016 1144   MONOABS 0.5 06/29/2021 1842   EOSABS 0.3 06/29/2021 1842   EOSABS 0.3 11/14/2016 1144   BASOSABS 0.1 06/29/2021 1842   BASOSABS 0.0 11/14/2016 1144   Lab Results  Component Value Date   TSH 1.690 10/15/2016   TSH 1.58 11/17/2015   TSH 2.279 10/28/2014         Parts of this note may have been dictated using voice recognition software. There may be variances in spelling and vocabulary which are unintentional. Not all errors are proofread. Please notify the Thereasa Parkin if any discrepancies are noted or if the meaning of any statement is not clear.

## 2023-04-22 ENCOUNTER — Other Ambulatory Visit: Payer: Self-pay | Admitting: Family Medicine

## 2023-04-22 DIAGNOSIS — R1013 Epigastric pain: Secondary | ICD-10-CM

## 2023-04-23 LAB — REFLEX TIQ

## 2023-04-23 LAB — VITAMIN D 25 HYDROXY (VIT D DEFICIENCY, FRACTURES): Vit D, 25-Hydroxy: 48 ng/mL (ref 30–100)

## 2023-04-23 LAB — RENAL FUNCTION PANEL
Albumin: 4.6 g/dL (ref 3.6–5.1)
BUN: 11 mg/dL (ref 7–25)
CO2: 31 mmol/L (ref 20–32)
Calcium: 10.9 mg/dL — ABNORMAL HIGH (ref 8.6–10.4)
Chloride: 99 mmol/L (ref 98–110)
Creat: 0.84 mg/dL (ref 0.60–0.95)
Glucose, Bld: 94 mg/dL (ref 65–99)
Phosphorus: 2.8 mg/dL (ref 2.1–4.3)
Potassium: 3.5 mmol/L (ref 3.5–5.3)
Sodium: 138 mmol/L (ref 135–146)

## 2023-04-23 LAB — VITAMIN D 1,25 DIHYDROXY
Vitamin D 1, 25 (OH)2 Total: 33 pg/mL (ref 18–72)
Vitamin D2 1, 25 (OH)2: <8 pg/mL
Vitamin D3 1, 25 (OH)2: 33 pg/mL

## 2023-04-23 LAB — TSH(REFL): TSH: 2.02 m[IU]/L (ref 0.40–4.50)

## 2023-04-23 LAB — PTH, INTACT AND CALCIUM
Calcium: 10.9 mg/dL — ABNORMAL HIGH (ref 8.6–10.4)
PTH: 49 pg/mL (ref 16–77)

## 2023-04-23 LAB — LAB REPORT - SCANNED
A1c: 5.8
HM Hepatitis Screen: NEGATIVE

## 2023-04-23 LAB — MAGNESIUM: Magnesium: 1.8 mg/dL (ref 1.5–2.5)

## 2023-04-26 LAB — LAB REPORT - SCANNED
A1c: 5.8
TSH: 2.48 (ref 0.41–5.90)

## 2023-04-29 ENCOUNTER — Ambulatory Visit
Admission: RE | Admit: 2023-04-29 | Discharge: 2023-04-29 | Disposition: A | Discharge status: Home / Self Care | Source: Ambulatory Visit | Attending: Family Medicine | Admitting: Family Medicine

## 2023-04-29 DIAGNOSIS — R1013 Epigastric pain: Secondary | ICD-10-CM

## 2023-05-05 ENCOUNTER — Ambulatory Visit (INDEPENDENT_AMBULATORY_CARE_PROVIDER_SITE_OTHER): Admitting: "Endocrinology

## 2023-05-05 ENCOUNTER — Encounter: Payer: Self-pay | Admitting: "Endocrinology

## 2023-05-05 VITALS — BP 120/80 | HR 64 | Ht 63.0 in | Wt 161.0 lb

## 2023-05-05 DIAGNOSIS — E041 Nontoxic single thyroid nodule: Secondary | ICD-10-CM | POA: Diagnosis not present

## 2023-05-05 NOTE — Progress Notes (Signed)
 Outpatient Endocrinology Note Jorge Newcomer, MD    Navia Lindahl Penafiel Jun 30, 1935 [id number]  Referring Provider: No ref. provider found Primary Care Provider: System, Provider Not In Reason for consultation: Subjective   Assessment & Plan  Diagnoses and all orders for this visit:  Thyroid  nodule -     US  THYROID ; Future -     US  THYROID    Patient is visiting from [place] , to seek endocrinology care; pt reports will not return  Patient is referred for hypercalcemia Reviewed previous labs: 12/27/2022 showed corrected calcium of 10.7 with PTH at 55, GFR 58, 24-hour urine calcium 159/24-hour 04/17/23: corrected calcium at 10.4, PTH/Vit D/BUN/Cr WNL  DEXA scan ordered by primary care, did not happened because daughter # is not in service; instructed pt to call radiology  Avoid calcium Important to have of good hydration to prevent severe hypercalcemia:  - 8 eight ounce glasses of fluids per day.  - low threshold for ER visit for IV fluids if having     nausea/vomiting/diarrhea and cannot keep well hydrated.   Thyroid  ultrasound could not be found Per records patient also has a history of thyroid  nodule in the left thyroid  lobe, and due to size surgery was referred urgently, however patient has not undergone any surgery or FNA at this point Ordered thyroid  ultrasound again  04/17/23: baseline TSH WNL   No follow-ups on file. Follow up in [place] , instructed to sign up for MyChart    I have reviewed current medications, nurse's notes, allergies, vital signs, past medical and surgical history, family medical history, and social history for this encounter. Counseled patient on symptoms, examination findings, lab findings, imaging results, treatment decisions and monitoring and prognosis. The patient understood the recommendations and agrees with the treatment plan. All questions regarding treatment plan were fully answered.  Jorge Newcomer, MD  05/05/23   History of Present  Illness HPI  Mccartney S Levinson is a 88 y.o. female referred by Dr. No ref. provider found for follow up on hypercalcemia.    Interval history: C/o chronic constipation No dysphagia/dyspnea Co voice changes sometimes, has issues with pollen Also c/o pain aroused the area of cervical lymph nodes B/L  Initial history:  Patient denies a history of kidney stones. History of Parathyroidectomy around 40 years ago in [facility]-no cancer per pt, records not found   She  current hematuria No polyuria No nocturia Yes thirst No renal failure No anorexia No abdominal pain No heartburn No constipation Yes nausea or vomiting No history of peptic ulcer disease No depression No confusion No excessive fatigue Yes fracture No osteoporosis No headaches No numbness No tingling No  She takes Calcium No She takes Vitamin D  supplements No  She a history of taking chronic lithium No She a recent history of thiazide diuretic intake No  She family history of renal stones/hypercalcemia Yes, daughter had kidney stones  a personal history of MEN syndromes/medullary thyroid  cancer/ pheochromocytoma No    Physical Exam  BP 120/80   Pulse 64   Ht 5\' 3"  (1.6 m)   Wt 161 lb (73 kg)   SpO2 97%   BMI 28.52 kg/m    Constitutional: well developed, well nourished Head: normocephalic, atraumatic Eyes: sclera anicteric, no redness Neck: supple, no thyromegaly/nodule appreciated Lungs: normal respiratory effort Neurology: alert and oriented Skin: dry, no appreciable rashes Musculoskeletal: no appreciable defects Psychiatric: normal mood and affect   Current Medications Patient's Medications  New Prescriptions   No medications on file  Previous Medications   ALBUTEROL  (VENTOLIN  HFA) 108 (90 BASE) MCG/ACT INHALER    INHALE 2 PUFFS INTO THE LUNGS EVERY 4 HOURS AS NEEDED FOR WHEEZING, SHORTNESS OF BREATH, OR COUGH   ALPHA-D-GALACTOSIDASE (BEANO) TABS    Take 2 tablets by mouth every 6 (six)  hours as needed (for gas).   AMLODIPINE (NORVASC) 5 MG TABLET    Take 5 mg by mouth daily.   APIXABAN  (ELIQUIS ) 5 MG TABS TABLET    Take 5 mg by mouth daily.   ATORVASTATIN (LIPITOR) 40 MG TABLET    Take 40 mg by mouth daily.   AZITHROMYCIN  (ZITHROMAX ) 250 MG TABLET    Take 2 tablets on first day, then take 1 daily. Finish entire supply.   B COMPLEX-C (B-COMPLEX WITH VITAMIN C) TABLET    Take 1 tablet by mouth daily.   BECLOMETHASONE (QVAR  REDIHALER) 80 MCG/ACT INHALER    Inhale 2 puffs in the morning and 1 puff in the evening.   BENZONATATE  (TESSALON ) 100 MG CAPSULE    Take 1 capsule (100 mg total) by mouth 2 (two) times daily as needed for cough.   CETIRIZINE  (ZYRTEC ) 10 MG TABLET    Take 1 tablet (10 mg total) by mouth daily as needed for allergies.   CHLORTHALIDONE  (HYGROTON ) 25 MG TABLET    Take 1 tablet by mouth once daily   DOCUSATE SODIUM  (COLACE) 100 MG CAPSULE    Take 1 capsule (100 mg total) by mouth 2 (two) times daily as needed for mild constipation.   EZETIMIBE (ZETIA) 10 MG TABLET    ezetimibe 10 mg tablet   FLUTICASONE  (FLONASE ) 50 MCG/ACT NASAL SPRAY    Place 2 sprays into both nostrils daily.   LIDOCAINE -HYDROCORTISONE  ACE 3-0.5 % KIT    App;y to anal area or by applicator into rectum every 12 hours as needed for pain.   LOSARTAN (COZAAR) 25 MG TABLET    Take 25 mg by mouth daily.   MULTIPLE VITAMINS-MINERALS (MULTIVITAMIN GUMMIES ADULT) CHEW    Chew 3 each by mouth daily.   POTASSIUM CHLORIDE  SA (K-DUR,KLOR-CON ) 20 MEQ TABLET    Take 20 mEq by mouth daily.   TRAMADOL  (ULTRAM ) 50 MG TABLET    Take 50 mg by mouth every 6 (six) hours as needed for moderate pain (pain score 4-6).   TRIAMCINOLONE  CREAM (KENALOG ) 0.1 %    APPLY TO AFFECTED AREA TWICE A DAY   TURMERIC PO    Take 5 mLs by mouth daily.   WIXELA INHUB 250-50 MCG/DOSE AEPB    TAKE 1 PUFF BY MOUTH TWICE A DAY  Modified Medications   No medications on file  Discontinued Medications   No medications on file     Allergies Allergies  Allergen Reactions   Ace Inhibitors Other (See Comments)    Reaction:  Unknown    Aspartame    Other Swelling and Other (See Comments)    Pt states that she is allergic to artificial sweeteners.   Reaction:  Facial swelling    Penicillins Other (See Comments)    Reaction:  Yeast infection  Has patient had a PCN reaction causing immediate rash, facial/tongue/throat swelling, SOB or lightheadedness with hypotension: No Has patient had a PCN reaction causing severe rash involving mucus membranes or skin necrosis: No Has patient had a PCN reaction that required hospitalization No Has patient had a PCN reaction occurring within the last 10 years: No If all of the above answers are "NO", then may proceed with  Cephalosporin use.    Past Medical History Past Medical History:  Diagnosis Date   Bradycardia    Gout    Hx of bladder infections    Hyperlipidemia    Hypertension    Migraine    Thyroid  disease    Vertigo     Past Surgical History Past Surgical History:  Procedure Laterality Date   ABDOMINAL HYSTERECTOMY     CHOLECYSTECTOMY     GALLBLADDER SURGERY     PARATHYROIDECTOMY      Family History family history includes Breast cancer in her maternal grandmother and paternal aunt.  Social History Social History   Socioeconomic History   Marital status: Divorced    Spouse name: Not on file   Number of children: 3   Years of education: Not on file   Highest education level: Not on file  Occupational History   Not on file  Tobacco Use   Smoking status: Never   Smokeless tobacco: Never  Vaping Use   Vaping status: Never Used  Substance and Sexual Activity   Alcohol use: Yes    Alcohol/week: 1.0 standard drink of alcohol    Types: 1 Glasses of wine per week   Drug use: No   Sexual activity: Not Currently    Birth control/protection: Surgical    Comment: HYST   Other Topics Concern   Not on file  Social History Narrative   Diet?       Do you drink/eat things with caffeine? Cup of macho- 1/week      Marital status?                divorced                    What year were you married? 1964      Do you live in a house, apartment, assisted living, condo, trailer, etc.? house      Is it one or more stories? one      How many persons live in your home? 1       Do you have any pets in your home? (please list) no      Current or past profession: Retired Runner, broadcasting/film/video       Do you exercise? Work sometimes                                     Type & how often?      Do you have a living will? no      Do you have a DNR form?   no                               If not, do you want to discuss one? no      Do you have signed POA/HPOA for forms?    Social Drivers of Corporate investment banker Strain: Not on file  Food Insecurity: Low Risk  (04/10/2022)   Received from [facility] [place]  and [place] , [facility] [place]    IP Discharge Planning Review    In the past 12 months have you experienced difficulty with any of the following?: No difficulties reported  Transportation Needs: Low Risk  (04/10/2022)   Received from [facility] [place] , [employer] [facility] and [facility] [place]  and [place]   IP Discharge Planning Review    In the past 12 months have you experienced difficulty with any of the following?: No difficulties reported  Physical Activity: Not on file  Stress: Not on file  Social Connections: Not on file  Intimate Partner Violence: Not on file    Lab Results  Component Value Date   CHOL 163 10/12/2018   Lab Results  Component Value Date   HDL 47 10/12/2018   Lab Results  Component Value Date   LDLCALC 76 10/12/2018   Lab Results  Component Value Date   TRIG 246 (H) 10/12/2018   Lab Results  Component Value Date   CHOLHDL 3.5 10/12/2018   Lab Results  Component Value Date   CREATININE 0.84 04/17/2023   Lab  Results  Component Value Date   GFR 80.23 10/22/2013      Component Value Date/Time   NA 138 04/17/2023 1119   NA 137 10/12/2018 1218   K 3.5 04/17/2023 1119   CL 99 04/17/2023 1119   CO2 31 04/17/2023 1119   GLUCOSE 94 04/17/2023 1119   BUN 11 04/17/2023 1119   BUN 9 10/12/2018 1218   CREATININE 0.84 04/17/2023 1119   CALCIUM 10.9 (H) 04/17/2023 1119   CALCIUM 10.9 (H) 04/17/2023 1119   PROT 7.8 06/29/2021 1842   PROT 7.2 10/12/2018 1218   ALBUMIN 4.2 06/29/2021 1842   ALBUMIN 4.3 10/12/2018 1218   AST 30 06/29/2021 1842   ALT 22 06/29/2021 1842   ALKPHOS 100 06/29/2021 1842   BILITOT 0.9 06/29/2021 1842   BILITOT 0.4 10/12/2018 1218   GFRNONAA 52 (L) 06/29/2021 1842   GFRAA 61 10/12/2018 1218      Latest Ref Rng & Units 04/17/2023   11:19 AM 06/29/2021    6:42 PM 06/21/2021    3:38 PM  BMP  Glucose 65 - 99 mg/dL 94  161  096   BUN 7 - 25 mg/dL 11  14  12    Creatinine 0.60 - 0.95 mg/dL 0.45  4.09  8.11   BUN/Creat Ratio 6 - 22 (calc) SEE NOTE:     Sodium 135 - 146 mmol/L 138  141  142   Potassium 3.5 - 5.3 mmol/L 3.5  3.7  3.2   Chloride 98 - 110 mmol/L 99  108  107   CO2 20 - 32 mmol/L 31  25  27    Calcium 8.6 - 10.4 mg/dL 8.6 - 91.4 mg/dL 78.2    95.6  21.3  08.6        Component Value Date/Time   WBC 5.9 06/29/2021 1842   RBC 5.16 (H) 06/29/2021 1842   HGB 13.7 06/29/2021 1842   HGB 14.1 11/14/2016 1144   HCT 42.0 06/29/2021 1842   HCT 44.2 11/14/2016 1144   PLT 224 06/29/2021 1842   PLT 235 11/14/2016 1144   MCV 81.4 06/29/2021 1842   MCV 81.8 05/09/2017 1747   MCV 84 11/14/2016 1144   MCH 26.6 06/29/2021 1842   MCHC 32.6 06/29/2021 1842   RDW 15.3 06/29/2021 1842   RDW 15.2 11/14/2016 1144   LYMPHSABS 2.8 06/29/2021 1842   LYMPHSABS 2.4 11/14/2016 1144   MONOABS 0.5 06/29/2021 1842   EOSABS 0.3 06/29/2021 1842   EOSABS 0.3 11/14/2016 1144   BASOSABS 0.1 06/29/2021 1842   BASOSABS 0.0 11/14/2016 1144   Lab Results  Component Value Date   TSH  1.690 10/15/2016   TSH 1.58 11/17/2015   TSH 2.279 10/28/2014  Parts of this note may have been dictated using voice recognition software. There may be variances in spelling and vocabulary which are unintentional. Not all errors are proofread. Please notify the [facility] if any discrepancies are noted or if the meaning of any statement is not clear.

## 2023-05-07 ENCOUNTER — Ambulatory Visit (HOSPITAL_COMMUNITY)
Admission: RE | Admit: 2023-05-07 | Discharge: 2023-05-07 | Disposition: A | Discharge status: Home / Self Care | Source: Ambulatory Visit | Attending: "Endocrinology | Admitting: "Endocrinology

## 2023-05-07 DIAGNOSIS — E041 Nontoxic single thyroid nodule: Secondary | ICD-10-CM | POA: Diagnosis present

## 2023-05-09 LAB — HM DEXA SCAN: HM Dexa Scan: NORMAL

## 2023-05-12 ENCOUNTER — Encounter: Payer: Self-pay | Admitting: "Endocrinology

## 2023-05-27 ENCOUNTER — Ambulatory Visit: Payer: Self-pay

## 2023-06-25 ENCOUNTER — Other Ambulatory Visit: Payer: Self-pay | Admitting: Cardiovascular Disease

## 2023-08-14 ENCOUNTER — Ambulatory Visit (INDEPENDENT_AMBULATORY_CARE_PROVIDER_SITE_OTHER): Admitting: "Endocrinology

## 2023-08-14 ENCOUNTER — Encounter: Payer: Self-pay | Admitting: "Endocrinology

## 2023-08-14 DIAGNOSIS — E041 Nontoxic single thyroid nodule: Secondary | ICD-10-CM

## 2023-08-14 NOTE — Patient Instructions (Signed)
 Avoid calcium Important to have of good hydration to prevent severe hypercalcemia:  - 8 eight ounce glasses of fluids per day.  - low threshold for ER visit for IV fluids if having     nausea/vomiting/diarrhea and cannot keep well hydrated.

## 2023-08-14 NOTE — Progress Notes (Signed)
 Outpatient Endocrinology Note Kathleen Birmingham, MD    Kathleen Pacheco [birth year] [id number]  Referring Provider: Maree Leni Edyth DELENA, MD Primary Care Provider: Maree Leni Edyth DELENA, MD Reason for consultation: Subjective   Assessment & Plan  Diagnoses and all orders for this visit:  Hypercalcemia -     Renal function panel   Patient is visiting from [place] , to seek endocrinology care Patient is referred for hypercalcemia Reviewed previous labs: 12/27/2022 showed corrected calcium of 10.7 with PTH at 55, GFR 58, 24-hour urine calcium 159/24-hour 04/17/23: corrected calcium at 10.4, PTH/Vit D/BUN/Cr WNL  DEXA scan ordered by primary care, did not happened because daughter # is not in service; instructed pt to call radiology  Avoid calcium Important to have of good hydration to prevent severe hypercalcemia:  - 8 eight ounce glasses of fluids per day.  - low threshold for ER visit for IV fluids if having     nausea/vomiting/diarrhea and cannot keep well hydrated.   Thyroid  ultrasound could not be found Per records patient also has a history of thyroid  nodule in the left thyroid  lobe, and due to size surgery was referred urgently, however patient has not undergone any surgery or FNA at this point 04/2023 thyroid  ultrasound images and report reviewed: report 1.4 cm right inferior TR 3 nodule, not meeting criteria for biopsy or dedicated follow-up  04/17/23: baseline TSH WNL   Return in about 1 year (around 08/13/2024) for visit, lab today, labs today. Follow up in [place] , instructed to sign up for MyChart    I have reviewed current medications, nurse's notes, allergies, vital signs, past medical and surgical history, family medical history, and social history for this encounter. Counseled patient on symptoms, examination findings, lab findings, imaging results, treatment decisions and monitoring and prognosis. The patient understood the recommendations and agrees with the treatment plan. All  questions regarding treatment plan were fully answered.  Kathleen Birmingham, MD  08/14/23   History of Present Illness HPI  Kathleen Pacheco is a 88 y.o. female referred by Dr. Maree for follow up on hypercalcemia.    Interval history: C/o chronic constipation No dysphagia/dyspnea C/o voice changes sometimes, gets hoarse sometimes, also has issues with pollen Also c/o pain aroused the area of cervical lymph nodes B/L  Initial history:  Patient denies a history of kidney stones. History of Parathyroidectomy around 40 years ago in [facility]-no cancer per pt, records not found   She  current hematuria No polyuria No nocturia Yes thirst No renal failure No anorexia No abdominal pain No heartburn No constipation Yes nausea or vomiting No history of peptic ulcer disease No depression No confusion No excessive fatigue Yes fracture No osteoporosis No headaches No numbness No tingling No  She takes Calcium No She takes Vitamin D  supplements No  She a history of taking chronic lithium No She a recent history of thiazide diuretic intake No  She family history of renal stones/hypercalcemia Yes, daughter had kidney stones  a personal history of MEN syndromes/medullary thyroid  cancer/ pheochromocytoma No    THYROID  ULTRASOUND   TECHNIQUE: Ultrasound examination of the thyroid  gland and adjacent soft tissues was performed.   COMPARISON:  01/13/2023 by report only   FINDINGS: Parenchymal Echotexture: Mildly heterogenous   Isthmus: 0.3 cm thickness, previously 0.4 by report   Right lobe: 4.4 x 2 x 1.4 cm, previously 3.7 x 1.6 x 1.7   Left lobe: 4.1 x 1.4 x 1.4 cm, previously 3 x 1.6 x 1.5  _________________________________________________________   Estimated total number of nodules >/= 1 cm: 1   Number of spongiform nodules >/=  2 cm not described below (TR1): 0   Number of mixed cystic and solid nodules >/= 1.5 cm not described below (TR2): 0    _________________________________________________________   Nodule # 1:   Prior biopsy: No   Location: Right; inferior   Maximum size: 1.4 cm; Other 2 dimensions: 1 x 1.4 cm, previously, 1.3 x 1.2 x 1.2 cm   Composition: solid/almost completely solid (2)   Echogenicity: hyperechoic (1)   Shape: not taller-than-wide (0)   Margins: ill-defined (0)   Echogenic foci: none (0)   ACR TI-RADS total points: 3.   ACR TI-RADS risk category:  TR 3.   Significant change in size (>/= 20% in two dimensions and minimal increase of 2 mm): No   ACR TI-RADS recommendations:   Given size (<1.4 cm) and appearance, this nodule does NOT meet TI-RADS criteria for biopsy or dedicated follow-up.   _________________________________________________________   Technologist describes technically difficult study secondary to body habitus, patient could not straighten neck.   IMPRESSION: 1.4 cm right inferior TR 3 nodule, not meeting criteria for biopsy or dedicated follow-up  Physical Exam  BP 134/80   Pulse (!) 56   Ht 5' 3 (1.6 m)   Wt 160 lb (72.6 kg)   SpO2 96%   BMI 28.34 kg/m    Constitutional: well developed, well nourished Head: normocephalic, atraumatic Eyes: sclera anicteric, no redness Neck: supple, no thyromegaly/+ nodule  Lungs: normal respiratory effort Neurology: alert and oriented Skin: dry, no appreciable rashes Musculoskeletal: no appreciable defects Psychiatric: normal mood and affect   Current Medications Patient's Medications  New Prescriptions   No medications on file  Previous Medications   ALBUTEROL  (VENTOLIN  HFA) 108 (90 BASE) MCG/ACT INHALER    INHALE 2 PUFFS INTO THE LUNGS EVERY 4 HOURS AS NEEDED FOR WHEEZING, SHORTNESS OF BREATH, OR COUGH   ALPHA-D-GALACTOSIDASE (BEANO) TABS    Take 2 tablets by mouth every 6 (six) hours as needed (for gas).   AMLODIPINE (NORVASC) 5 MG TABLET    Take 5 mg by mouth daily.   APIXABAN  (ELIQUIS ) 5 MG TABS TABLET     Take 5 mg by mouth daily.   ATORVASTATIN (LIPITOR) 40 MG TABLET    Take 40 mg by mouth daily.   AZITHROMYCIN  (ZITHROMAX ) 250 MG TABLET    Take 2 tablets on first day, then take 1 daily. Finish entire supply.   B COMPLEX-C (B-COMPLEX WITH VITAMIN C) TABLET    Take 1 tablet by mouth daily.   BECLOMETHASONE (QVAR  REDIHALER) 80 MCG/ACT INHALER    Inhale 2 puffs in the morning and 1 puff in the evening.   BENZONATATE  (TESSALON ) 100 MG CAPSULE    Take 1 capsule (100 mg total) by mouth 2 (two) times daily as needed for cough.   CETIRIZINE  (ZYRTEC ) 10 MG TABLET    Take 1 tablet (10 mg total) by mouth daily as needed for allergies.   CHLORTHALIDONE  (HYGROTON ) 25 MG TABLET    Take 1 tablet by mouth once daily   DOCUSATE SODIUM  (COLACE) 100 MG CAPSULE    Take 1 capsule (100 mg total) by mouth 2 (two) times daily as needed for mild constipation.   EZETIMIBE (ZETIA) 10 MG TABLET    ezetimibe 10 mg tablet   FLUTICASONE  (FLONASE ) 50 MCG/ACT NASAL SPRAY    Place 2 sprays into both nostrils daily.   LIDOCAINE -HYDROCORTISONE  ACE  3-0.5 % KIT    App;y to anal area or by applicator into rectum every 12 hours as needed for pain.   LOSARTAN (COZAAR) 25 MG TABLET    Take 25 mg by mouth daily.   MULTIPLE VITAMINS-MINERALS (MULTIVITAMIN GUMMIES ADULT) CHEW    Chew 3 each by mouth daily.   POTASSIUM CHLORIDE  SA (K-DUR,KLOR-CON ) 20 MEQ TABLET    Take 20 mEq by mouth daily.   TRAMADOL  (ULTRAM ) 50 MG TABLET    Take 50 mg by mouth every 6 (six) hours as needed for moderate pain (pain score 4-6).   TRIAMCINOLONE  CREAM (KENALOG ) 0.1 %    APPLY TO AFFECTED AREA TWICE A DAY   TURMERIC PO    Take 5 mLs by mouth daily.   WIXELA INHUB 250-50 MCG/DOSE AEPB    TAKE 1 PUFF BY MOUTH TWICE A DAY  Modified Medications   No medications on file  Discontinued Medications   No medications on file    Allergies Allergies  Allergen Reactions   Ace Inhibitors Other (See Comments)    Reaction:  Unknown    Aspartame    Other Swelling and  Other (See Comments)    Pt states that she is allergic to artificial sweeteners.   Reaction:  Facial swelling    Penicillins Other (See Comments)    Reaction:  Yeast infection  Has patient had a PCN reaction causing immediate rash, facial/tongue/throat swelling, SOB or lightheadedness with hypotension: No Has patient had a PCN reaction causing severe rash involving mucus membranes or skin necrosis: No Has patient had a PCN reaction that required hospitalization No Has patient had a PCN reaction occurring within the last 10 years: No If all of the above answers are NO, then may proceed with Cephalosporin use.    Past Medical History Past Medical History:  Diagnosis Date   Bradycardia    Gout    Hx of bladder infections    Hyperlipidemia    Hypertension    Migraine    Thyroid  disease    Vertigo     Past Surgical History Past Surgical History:  Procedure Laterality Date   ABDOMINAL HYSTERECTOMY     CHOLECYSTECTOMY     GALLBLADDER SURGERY     PARATHYROIDECTOMY      Family History family history includes Breast cancer in her maternal grandmother and paternal aunt.  Social History Social History   Socioeconomic History   Marital status: Divorced    Spouse name: Not on file   Number of children: 3   Years of education: Not on file   Highest education level: Not on file  Occupational History   Not on file  Tobacco Use   Smoking status: Never   Smokeless tobacco: Never  Vaping Use   Vaping status: Never Used  Substance and Sexual Activity   Alcohol use: Yes    Alcohol/week: 1.0 standard drink of alcohol    Types: 1 Glasses of wine per week   Drug use: No   Sexual activity: Not Currently    Birth control/protection: Surgical    Comment: HYST   Other Topics Concern   Not on file  Social History Narrative   Diet?      Do you drink/eat things with caffeine? Cup of macho- 1/week      Marital status?                divorced  What year were you  married? 1964      Do you live in a house, apartment, assisted living, condo, trailer, etc.? house      Is it one or more stories? one      How many persons live in your home? 1       Do you have any pets in your home? (please list) no      Current or past profession: Retired Runner, broadcasting/film/video       Do you exercise? Work sometimes                                     Type & how often?      Do you have a living will? no      Do you have a DNR form?   no                               If not, do you want to discuss one? no      Do you have signed POA/HPOA for forms?    Social Drivers of Corporate investment banker Strain: Not on file  Food Insecurity: Low Risk  (04/10/2022)   Received from [facility] and Services [place]  and [place]    IP Discharge Planning Review    In the past 12 months have you experienced difficulty with any of the following?: No difficulties reported  Transportation Needs: Low Risk  (04/10/2022)   Received from [facility] and Services [place]  and [place]    IP Discharge Planning Review    In the past 12 months have you experienced difficulty with any of the following?: No difficulties reported  Physical Activity: Not on file  Stress: Not on file  Social Connections: Not on file  Intimate Partner Violence: Not on file    Lab Results  Component Value Date   CHOL 163 10/12/2018   Lab Results  Component Value Date   HDL 47 10/12/2018   Lab Results  Component Value Date   LDLCALC 76 10/12/2018   Lab Results  Component Value Date   TRIG 246 (H) 10/12/2018   Lab Results  Component Value Date   CHOLHDL 3.5 10/12/2018   Lab Results  Component Value Date   CREATININE 0.84 04/17/2023   Lab Results  Component Value Date   GFR 80.23 10/22/2013      Component Value Date/Time   NA 138 04/17/2023 1119   NA 137 10/12/2018 1218   K 3.5 04/17/2023 1119   CL 99 04/17/2023 1119   CO2 31 04/17/2023 1119   GLUCOSE 94 04/17/2023 1119   BUN 11  04/17/2023 1119   BUN 9 10/12/2018 1218   CREATININE 0.84 04/17/2023 1119   CALCIUM 10.9 (H) 04/17/2023 1119   CALCIUM 10.9 (H) 04/17/2023 1119   PROT 7.8 06/29/2021 1842   PROT 7.2 10/12/2018 1218   ALBUMIN 4.2 06/29/2021 1842   ALBUMIN 4.3 10/12/2018 1218   AST 30 06/29/2021 1842   ALT 22 06/29/2021 1842   ALKPHOS 100 06/29/2021 1842   BILITOT 0.9 06/29/2021 1842   BILITOT 0.4 10/12/2018 1218   GFRNONAA 52 (L) 06/29/2021 1842   GFRAA 61 10/12/2018 1218      Latest Ref Rng & Units 04/17/2023   11:19 AM 06/29/2021    6:42 PM 06/21/2021    3:38 PM  BMP  Glucose 65 - 99 mg/dL 94  853  891   BUN 7 - 25 mg/dL 11  14  12    Creatinine 0.60 - 0.95 mg/dL 9.15  8.94  8.99   BUN/Creat Ratio 6 - 22 (calc) SEE NOTE:     Sodium 135 - 146 mmol/L 138  141  142   Potassium 3.5 - 5.3 mmol/L 3.5  3.7  3.2   Chloride 98 - 110 mmol/L 99  108  107   CO2 20 - 32 mmol/L 31  25  27    Calcium 8.6 - 10.4 mg/dL 8.6 - 89.5 mg/dL 89.0    89.0  89.8  89.6        Component Value Date/Time   WBC 5.9 06/29/2021 1842   RBC 5.16 (H) 06/29/2021 1842   HGB 13.7 06/29/2021 1842   HGB 14.1 11/14/2016 1144   HCT 42.0 06/29/2021 1842   HCT 44.2 11/14/2016 1144   PLT 224 06/29/2021 1842   PLT 235 11/14/2016 1144   MCV 81.4 06/29/2021 1842   MCV 81.8 05/09/2017 1747   MCV 84 11/14/2016 1144   MCH 26.6 06/29/2021 1842   MCHC 32.6 06/29/2021 1842   RDW 15.3 06/29/2021 1842   RDW 15.2 11/14/2016 1144   LYMPHSABS 2.8 06/29/2021 1842   LYMPHSABS 2.4 11/14/2016 1144   MONOABS 0.5 06/29/2021 1842   EOSABS 0.3 06/29/2021 1842   EOSABS 0.3 11/14/2016 1144   BASOSABS 0.1 06/29/2021 1842   BASOSABS 0.0 11/14/2016 1144   Lab Results  Component Value Date   TSH 1.690 10/15/2016   TSH 1.58 11/17/2015   TSH 2.279 10/28/2014         Parts of this note may have been dictated using voice recognition software. There may be variances in spelling and vocabulary which are unintentional. Not all errors are proofread.  Please notify the dino if any discrepancies are noted or if the meaning of any statement is not clear.

## 2023-08-18 LAB — RENAL FUNCTION PANEL
Albumin: 4.1 g/dL (ref 3.6–5.1)
BUN/Creatinine Ratio: 11 (calc) (ref 6–22)
BUN: 11 mg/dL (ref 7–25)
CO2: 30 mmol/L (ref 20–32)
Calcium: 10.7 mg/dL — ABNORMAL HIGH (ref 8.6–10.4)
Chloride: 100 mmol/L (ref 98–110)
Creat: 0.96 mg/dL — ABNORMAL HIGH (ref 0.60–0.95)
Glucose, Bld: 82 mg/dL (ref 65–99)
Phosphorus: 3.4 mg/dL (ref 2.1–4.3)
Potassium: 3.9 mmol/L (ref 3.5–5.3)
Sodium: 136 mmol/L (ref 135–146)

## 2023-08-18 LAB — PTH, INTACT AND CALCIUM
Calcium: 10.7 mg/dL — ABNORMAL HIGH (ref 8.6–10.4)
PTH: 42 pg/mL (ref 16–77)

## 2023-08-18 LAB — VITAMIN D 25 HYDROXY (VIT D DEFICIENCY, FRACTURES): Vit D, 25-Hydroxy: 49 ng/mL (ref 30–100)

## 2023-08-18 LAB — VITAMIN D 1,25 DIHYDROXY
Vitamin D 1, 25 (OH)2 Total: 31 pg/mL (ref 18–72)
Vitamin D2 1, 25 (OH)2: <8 pg/mL
Vitamin D3 1, 25 (OH)2: 31 pg/mL

## 2023-08-18 LAB — MAGNESIUM: Magnesium: 1.9 mg/dL (ref 1.5–2.5)

## 2023-08-20 ENCOUNTER — Ambulatory Visit: Admitting: "Endocrinology

## 2023-09-22 ENCOUNTER — Telehealth: Payer: Self-pay

## 2023-09-22 NOTE — Telephone Encounter (Signed)
 Pt called to informed office that she is unable to get Vit D1. Please advise if she can take a different  Vit D OTC?

## 2023-09-22 NOTE — Telephone Encounter (Signed)
 Mychart message sent regarding Vit D

## 2023-09-23 ENCOUNTER — Telehealth: Payer: Self-pay | Admitting: Dietician

## 2023-09-23 NOTE — Telephone Encounter (Signed)
 Patient left a message on our answering maching stating that she had a question for Dr. Elliot nurse. Called patient but she was not available.  Left a message for her to call [phone] if she continues to have concerns.  Leita Constable, RD, LDN, CDCES, DipACLM

## 2023-12-01 ENCOUNTER — Other Ambulatory Visit: Payer: Self-pay | Admitting: Family Medicine

## 2023-12-01 DIAGNOSIS — Z1231 Encounter for screening mammogram for malignant neoplasm of breast: Secondary | ICD-10-CM

## 2023-12-19 ENCOUNTER — Other Ambulatory Visit: Payer: Self-pay

## 2023-12-19 ENCOUNTER — Emergency Department (HOSPITAL_COMMUNITY): Admission: EM | Admit: 2023-12-19 | Discharge: 2023-12-19 | Disposition: A | Discharge status: Home / Self Care

## 2023-12-19 ENCOUNTER — Emergency Department (HOSPITAL_COMMUNITY)

## 2023-12-19 ENCOUNTER — Emergency Department (EMERGENCY_DEPARTMENT_HOSPITAL)

## 2023-12-19 DIAGNOSIS — M79674 Pain in right toe(s): Secondary | ICD-10-CM | POA: Insufficient documentation

## 2023-12-19 DIAGNOSIS — Z7901 Long term (current) use of anticoagulants: Secondary | ICD-10-CM | POA: Insufficient documentation

## 2023-12-19 DIAGNOSIS — M19071 Primary osteoarthritis, right ankle and foot: Secondary | ICD-10-CM

## 2023-12-19 DIAGNOSIS — I10 Essential (primary) hypertension: Secondary | ICD-10-CM | POA: Insufficient documentation

## 2023-12-19 DIAGNOSIS — Z79899 Other long term (current) drug therapy: Secondary | ICD-10-CM | POA: Insufficient documentation

## 2023-12-19 LAB — COMPREHENSIVE METABOLIC PANEL WITH GFR
ALT: 21 U/L (ref 0–44)
AST: 31 U/L (ref 15–41)
Albumin: 4.1 g/dL (ref 3.5–5.0)
Alkaline Phosphatase: 102 U/L (ref 38–126)
Anion gap: 7 (ref 5–15)
BUN: 12 mg/dL (ref 8–23)
CO2: 27 mmol/L (ref 22–32)
Calcium: 10.5 mg/dL — ABNORMAL HIGH (ref 8.9–10.3)
Chloride: 105 mmol/L (ref 98–111)
Creatinine, Ser: 0.93 mg/dL (ref 0.44–1.00)
GFR, Estimated: 60 mL/min — ABNORMAL LOW (ref >60)
Glucose, Bld: 92 mg/dL (ref 70–99)
Potassium: 3.7 mmol/L (ref 3.5–5.1)
Sodium: 139 mmol/L (ref 135–145)
Total Bilirubin: 0.6 mg/dL (ref 0.0–1.2)
Total Protein: 7.3 g/dL (ref 6.5–8.1)

## 2023-12-19 LAB — CBC WITH DIFFERENTIAL/PLATELET
Abs Immature Granulocytes: 0.01 K/uL (ref 0.00–0.07)
Basophils Absolute: 0.1 K/uL (ref 0.0–0.1)
Basophils Relative: 1 %
Eosinophils Absolute: 0.3 K/uL (ref 0.0–0.5)
Eosinophils Relative: 7 %
HCT: 41.3 % (ref 36.0–46.0)
Hemoglobin: 13.2 g/dL (ref 12.0–15.0)
Immature Granulocytes: 0 %
Lymphocytes Relative: 52 %
Lymphs Abs: 2.3 K/uL (ref 0.7–4.0)
MCH: 27.1 pg (ref 26.0–34.0)
MCHC: 32 g/dL (ref 30.0–36.0)
MCV: 84.8 fL (ref 80.0–100.0)
Monocytes Absolute: 0.4 K/uL (ref 0.1–1.0)
Monocytes Relative: 9 %
Neutro Abs: 1.4 K/uL — ABNORMAL LOW (ref 1.7–7.7)
Neutrophils Relative %: 31 %
Platelets: 191 K/uL (ref 150–400)
RBC: 4.87 MIL/uL (ref 3.87–5.11)
RDW: 15.4 % (ref 11.5–15.5)
WBC: 4.5 K/uL (ref 4.0–10.5)
nRBC: 0 % (ref 0.0–0.2)

## 2023-12-19 LAB — D-DIMER, QUANTITATIVE: D-Dimer, Quant: <0.27 ug{FEU}/mL (ref 0.00–0.50)

## 2023-12-19 MED ORDER — NAPROXEN 500 MG PO TABS: 500.0000 mg | ORAL_TABLET | Freq: Two times a day (BID) | ORAL | 0 refills | Status: AC

## 2023-12-19 NOTE — Progress Notes (Signed)
 VASCULAR LAB    Right lower extremity venous duplex has been performed.  See CV proc for preliminary results.  Relayed results to Dorn Dec, PA-C via secure chat  RACHEL PELLET, RVT 12/19/2023, 11:20 AM

## 2023-12-19 NOTE — ED Triage Notes (Signed)
 Pt arrives via EMS from home. PT reports RIGHT great toe pain that began last night. Pt reports a hx of the gout.

## 2023-12-19 NOTE — ED Provider Notes (Signed)
 [place] EMERGENCY DEPARTMENT AT [facility] Provider Note   CSN: [id number] Arrival date & time: 12/19/23  9280     Patient presents with: Toe Pain   Kathleen Pacheco is a 88 y.o. female who presents to the ED today with complaint of pain in her right great toe.  This began last night, however she states that she has been having extensive air travel over the last several days and does have a history of DVT in the right lower extremity.  She endorses tenderness in the forefoot and the distal right lower extremity, states that she does not have a history of swelling to the lower extremities at baseline.  Review of her previous medical history does show diagnoses of essential hypertension and hypercholesterolemia, as well as chronic DVT of the right lower extremity.  Previous ultrasound imaging as noted on June 2023 which did show DVT in the right posterior tibial veins.  She has had a previous history of gout as well but is currently not on a preventative regimen.  She denies having any shortness of breath, chest pain.  Does states she does have some exertional dyspnea however states this is baseline for her.    Toe Pain       Prior to Admission medications   Medication Sig Start Date End Date Taking? Authorizing Provider  naproxen  (NAPROSYN ) 500 MG tablet Take 1 tablet (500 mg total) by mouth 2 (two) times daily. 12/19/23  Yes Myriam Dorn BROCKS, PA  albuterol  (VENTOLIN  HFA) 108 (90 Base) MCG/ACT inhaler INHALE 2 PUFFS INTO THE LUNGS EVERY 4 HOURS AS NEEDED FOR WHEEZING, SHORTNESS OF BREATH, OR COUGH 11/30/18   Stallings, Zoe A, MD  Alpha-D-Galactosidase [facility]) TABS Take 2 tablets by mouth every 6 (six) hours as needed (for gas).    [provider]  amLODipine (NORVASC) 5 MG tablet Take 5 mg by mouth daily.    [provider]  apixaban  (ELIQUIS ) 5 MG TABS tablet Take 5 mg by mouth daily.    [provider]  atorvastatin (LIPITOR) 40 MG tablet Take  40 mg by mouth daily.    [provider]  azithromycin  (ZITHROMAX ) 250 MG tablet Take 2 tablets on first day, then take 1 daily. Finish entire supply. 05/31/19   Kip Ade, NP  B Complex-C (B-COMPLEX WITH VITAMIN C) tablet Take 1 tablet by mouth daily.    [provider]  beclomethasone (QVAR  REDIHALER) 80 MCG/ACT inhaler Inhale 2 puffs in the morning and 1 puff in the evening. 11/30/18   Stallings, Zoe A, MD  benzonatate  (TESSALON ) 100 MG capsule Take 1 capsule (100 mg total) by mouth 2 (two) times daily as needed for cough. 05/31/19   Kip Ade, NP  cetirizine  (ZYRTEC ) 10 MG tablet Take 1 tablet (10 mg total) by mouth daily as needed for allergies. 11/26/17   Wiseman, Brittany D, PA-C  chlorthalidone  (HYGROTON ) 25 MG tablet Take 1 tablet by mouth once daily 12/19/22   Burnard Debby LABOR, MD  docusate sodium  (COLACE) 100 MG capsule Take 1 capsule (100 mg total) by mouth 2 (two) times daily as needed for mild constipation. 04/18/21   Petrucelli, Samantha R, PA-C  ezetimibe (ZETIA) 10 MG tablet ezetimibe 10 mg tablet    [provider]  fluticasone  (FLONASE ) 50 MCG/ACT nasal spray Place 2 sprays into both nostrils daily. 05/31/19   Kip Ade, NP  Lidocaine -Hydrocortisone  Ace 3-0.5 % KIT App;y to anal area or by applicator into rectum every 12 hours  as needed for pain. 04/18/21   Petrucelli, Samantha R, PA-C  losartan (COZAAR) 25 MG tablet Take 25 mg by mouth daily. 04/21/23   [provider]  Multiple Vitamins-Minerals (MULTIVITAMIN GUMMIES ADULT) CHEW Chew 3 each by mouth daily.    [provider]  potassium chloride  SA (K-DUR,KLOR-CON ) 20 MEQ tablet Take 20 mEq by mouth daily.    [provider]  traMADol  (ULTRAM ) 50 MG tablet Take 50 mg by mouth every 6 (six) hours as needed for moderate pain (pain score 4-6).    [provider]  triamcinolone  cream (KENALOG ) 0.1 % APPLY TO AFFECTED AREA TWICE A DAY 02/25/19   Stallings, Zoe A, MD   TURMERIC PO Take 5 mLs by mouth daily.    [provider]  NAPOLEON INHUB 250-50 MCG/DOSE AEPB TAKE 1 PUFF BY MOUTH TWICE A DAY 09/29/19   Kip Ade, NP    Allergies: Ace inhibitors, Aspartame, Other, and Penicillins    Review of Systems  Musculoskeletal:  Positive for arthralgias.  All other systems reviewed and are negative.   Updated Vital Signs BP (!) 140/82   Pulse (!) 53   Temp 97.7 F (36.5 C)   Resp 16   SpO2 100%   Physical Exam Vitals and nursing note reviewed.  Constitutional:      General: She is awake. She is not in acute distress.    Appearance: Normal appearance. She is well-developed, well-groomed and normal weight.  HENT:     Head: Normocephalic and atraumatic.     Mouth/Throat:     Mouth: Mucous membranes are moist.     Pharynx: Oropharynx is clear.  Eyes:     Extraocular Movements: Extraocular movements intact.     Conjunctiva/sclera: Conjunctivae normal.     Pupils: Pupils are equal, round, and reactive to light.  Cardiovascular:     Rate and Rhythm: Normal rate and regular rhythm.     Pulses: Normal pulses.          Dorsalis pedis pulses are 2+ on the right side and 2+ on the left side.       Posterior tibial pulses are 2+ on the right side and 2+ on the left side.     Heart sounds: Normal heart sounds. No murmur heard.    No friction rub. No gallop.     Comments: Mild nonpitting edema is appreciated to the right lower extremity distally. Pulmonary:     Effort: Pulmonary effort is normal.     Breath sounds: Normal breath sounds.  Abdominal:     General: Abdomen is flat. Bowel sounds are normal.     Palpations: Abdomen is soft.  Musculoskeletal:        General: Normal range of motion.     Cervical back: Normal range of motion and neck supple.     Right lower leg: Tenderness present. 1+ Edema present.     Left lower leg: No edema.     Comments: Tenderness appreciated in the right calf as well as into the distal anterior shin, and  into the forefoot of the right foot.  There is no notable edema or erythema to the MTP at the right great toe, no tenderness to palpation of the same.  Skin:    General: Skin is warm and dry.     Capillary Refill: Capillary refill takes less than 2 seconds.  Neurological:     General: No focal deficit present.     Mental Status: She is alert. Mental  status is at baseline.  Psychiatric:        Mood and Affect: Mood normal.        Behavior: Behavior is cooperative.     (all labs ordered are listed, but only abnormal results are displayed) Labs Reviewed  COMPREHENSIVE METABOLIC PANEL WITH GFR - Abnormal; Notable for the following components:      Result Value   Calcium 10.5 (*)    GFR, Estimated 60 (*)    All other components within normal limits  CBC WITH DIFFERENTIAL/PLATELET - Abnormal; Notable for the following components:   Neutro Abs 1.4 (*)    All other components within normal limits  D-DIMER, QUANTITATIVE    EKG: None  Radiology: DG Foot Complete Right Result Date: 12/19/2023 CLINICAL DATA:  right great toe pain EXAM: RIGHT FOOT COMPLETE - 3+ VIEW COMPARISON:  None Available. FINDINGS: Hallux valgus deformity.No acute fracture or dislocation. Mild joint space loss of the first metatarsophalangeal joint. Large enthesophyte along the Achilles insertion. Soft tissues are unremarkable. IMPRESSION: 1. No acute fracture or dislocation. 2. Hallux valgus deformity with mild osteoarthritis of the first metatarsophalangeal joint. Electronically Signed   By: Rogelia Myers M.D.   On: 12/19/2023 08:47     Procedures   Medications Ordered in the ED - No data to display                                  Medical Decision Making Amount and/or Complexity of Data Reviewed Labs: ordered. Radiology: ordered.   Medical Decision Making:   Kathleen Pacheco is a 88 y.o. female who presented to the ED today with right great toe pain detailed above.    Additional history discussed with  patient's family/caregivers.  External chart has been reviewed including previous labs, imaging. Complete initial physical exam performed, notably the patient  was alert and oriented in no apparent distress.  She does complain of pain to the distal phalanx of the right great toe however does not have MTP pain, no erythema or edema to the same.   There is noted mild edema of the right distal lower extremity, tenderness to the same. Reviewed and confirmed nursing documentation for past medical history, family history, social history.    Initial Assessment:   With the patient's presentation of right toe pain as well as swelling to the right distal lower extremity, considering previous diagnosis of chronic DVT, consider exacerbation of previous DVT.  Also consider possible osteoarthritis and/or gouty arthritis of the right distal lower extremity.  Initial Plan:  Obtain plain film imaging of the right foot to assess for osteoarthritis and gout. Obtain ultrasound of the right lower extremity to evaluate for worsening of previous DVT. Screening labs including CBC and Metabolic panel to evaluate for infectious or metabolic etiology of disease.  Obtain D-dimer to evaluate for clot risk/DVT risk.  Patient is not dyspneic nor does she have any chest pain, do not suspect PE at this time. Objective evaluation as below reviewed   Initial Study Results:   Laboratory  All laboratory results reviewed without evidence of clinically relevant pathology.   Exceptions include: None  Radiology:  All images reviewed independently. Agree with radiology report at this time.   DG Foot Complete Right Result Date: 12/19/2023 CLINICAL DATA:  right great toe pain EXAM: RIGHT FOOT COMPLETE - 3+ VIEW COMPARISON:  None Available. FINDINGS: Hallux valgus deformity.No acute fracture or dislocation. Mild joint  space loss of the first metatarsophalangeal joint. Large enthesophyte along the Achilles insertion. Soft tissues are  unremarkable. IMPRESSION: 1. No acute fracture or dislocation. 2. Hallux valgus deformity with mild osteoarthritis of the first metatarsophalangeal joint. Electronically Signed   By: Rogelia Myers M.D.   On: 12/19/2023 08:47   Preliminary read from vascular ultrasound shows negative for DVT.   Reassessment and Plan:   On reevaluation the patient, subjective clinical status is unchanged.  Vital signs continue to be stable and workup is unremarkable.  D-dimer is negative and preliminary results from the vascular ultrasound obtained of the right lower extremity does not show positive results for DVT.  Given the x-ray findings of osteoarthritis in the first MTP, find her symptoms consistent with persistent osteoarthritis.  At this time we will manage this with oral NSAIDs and refer to primary care for continued management of osteoarthritis.  This was discussed in depth with patient and she understands and agrees has no further concerns at this time.  Careful return precautions have been given and which the patient verbalizes understanding and agreement.       Final diagnoses:  Primary osteoarthritis of right foot  Pain of right great toe    ED Discharge Orders          Ordered    naproxen  (NAPROSYN ) 500 MG tablet  2 times daily        12/19/23 1148               Myriam Dorn BROCKS, [place] 12/19/23 1148    Ula Prentice SAUNDERS, MD 12/19/23 [phone]

## 2023-12-26 ENCOUNTER — Ambulatory Visit
Admission: RE | Admit: 2023-12-26 | Discharge: 2023-12-26 | Disposition: A | Source: Ambulatory Visit | Attending: Family Medicine | Admitting: Family Medicine

## 2023-12-26 DIAGNOSIS — Z1231 Encounter for screening mammogram for malignant neoplasm of breast: Secondary | ICD-10-CM
